# Patient Record
Sex: Female | Born: 1960
Health system: Southern US, Community
[De-identification: ages and names within clinical notes are randomized; demographics above are authoritative.]

## PROBLEM LIST (undated history)

## (undated) DIAGNOSIS — R569 Unspecified convulsions: Secondary | ICD-10-CM

## (undated) DIAGNOSIS — T7840XA Allergy, unspecified, initial encounter: Secondary | ICD-10-CM

## (undated) DIAGNOSIS — F419 Anxiety disorder, unspecified: Secondary | ICD-10-CM

## (undated) DIAGNOSIS — J189 Pneumonia, unspecified organism: Secondary | ICD-10-CM

## (undated) DIAGNOSIS — Z8669 Personal history of other diseases of the nervous system and sense organs: Secondary | ICD-10-CM

## (undated) DIAGNOSIS — M1990 Unspecified osteoarthritis, unspecified site: Secondary | ICD-10-CM

## (undated) DIAGNOSIS — J309 Allergic rhinitis, unspecified: Secondary | ICD-10-CM

## (undated) DIAGNOSIS — J449 Chronic obstructive pulmonary disease, unspecified: Secondary | ICD-10-CM

## (undated) DIAGNOSIS — K828 Other specified diseases of gallbladder: Secondary | ICD-10-CM

## (undated) DIAGNOSIS — Z923 Personal history of irradiation: Secondary | ICD-10-CM

## (undated) DIAGNOSIS — Z87442 Personal history of urinary calculi: Secondary | ICD-10-CM

## (undated) DIAGNOSIS — R0600 Dyspnea, unspecified: Secondary | ICD-10-CM

## (undated) DIAGNOSIS — J439 Emphysema, unspecified: Secondary | ICD-10-CM

## (undated) DIAGNOSIS — C50919 Malignant neoplasm of unspecified site of unspecified female breast: Secondary | ICD-10-CM

## (undated) DIAGNOSIS — F329 Major depressive disorder, single episode, unspecified: Secondary | ICD-10-CM

## (undated) DIAGNOSIS — I10 Essential (primary) hypertension: Secondary | ICD-10-CM

## (undated) DIAGNOSIS — G4730 Sleep apnea, unspecified: Secondary | ICD-10-CM

## (undated) DIAGNOSIS — F32A Depression, unspecified: Secondary | ICD-10-CM

## (undated) DIAGNOSIS — N1831 Chronic kidney disease, stage 3a: Secondary | ICD-10-CM

## (undated) DIAGNOSIS — E785 Hyperlipidemia, unspecified: Secondary | ICD-10-CM

## (undated) HISTORY — PX: COLONOSCOPY: SHX174

## (undated) HISTORY — DX: Chronic obstructive pulmonary disease, unspecified: J44.9

## (undated) HISTORY — DX: Unspecified osteoarthritis, unspecified site: M19.90

## (undated) HISTORY — DX: Depression, unspecified: F32.A

## (undated) HISTORY — PX: ABDOMINAL HYSTERECTOMY: SHX81

## (undated) HISTORY — DX: Personal history of other diseases of the nervous system and sense organs: Z86.69

## (undated) HISTORY — DX: Sleep apnea, unspecified: G47.30

## (undated) HISTORY — DX: Emphysema, unspecified: J43.9

## (undated) HISTORY — DX: Anxiety disorder, unspecified: F41.9

## (undated) HISTORY — DX: Allergic rhinitis, unspecified: J30.9

## (undated) HISTORY — DX: Unspecified convulsions: R56.9

## (undated) HISTORY — DX: Hyperlipidemia, unspecified: E78.5

## (undated) HISTORY — DX: Major depressive disorder, single episode, unspecified: F32.9

## (undated) HISTORY — DX: Allergy, unspecified, initial encounter: T78.40XA

---

## 1993-09-28 HISTORY — PX: OTHER SURGICAL HISTORY: SHX169

## 1993-09-28 HISTORY — PX: PARTIAL HYSTERECTOMY: SHX80

## 2011-03-13 LAB — HM PAP SMEAR

## 2011-04-28 ENCOUNTER — Ambulatory Visit: Payer: Self-pay

## 2011-05-01 LAB — HM MAMMOGRAPHY: HM MAMMO: ABNORMAL — AB (ref 0–4)

## 2011-05-14 ENCOUNTER — Ambulatory Visit: Payer: Self-pay

## 2011-07-21 ENCOUNTER — Ambulatory Visit: Payer: Self-pay | Admitting: General Surgery

## 2011-07-21 LAB — HM COLONOSCOPY

## 2012-09-28 HISTORY — PX: BREAST BIOPSY: SHX20

## 2014-07-12 ENCOUNTER — Ambulatory Visit: Payer: Self-pay | Admitting: Family Medicine

## 2015-03-07 ENCOUNTER — Other Ambulatory Visit: Payer: Self-pay | Admitting: Unknown Physician Specialty

## 2015-09-02 ENCOUNTER — Other Ambulatory Visit: Payer: Self-pay | Admitting: Unknown Physician Specialty

## 2015-10-04 ENCOUNTER — Other Ambulatory Visit: Payer: Self-pay | Admitting: Unknown Physician Specialty

## 2015-11-30 ENCOUNTER — Other Ambulatory Visit: Payer: Self-pay | Admitting: Unknown Physician Specialty

## 2016-01-01 ENCOUNTER — Other Ambulatory Visit: Payer: Self-pay | Admitting: Unknown Physician Specialty

## 2016-04-02 DIAGNOSIS — K080 Exfoliation of teeth due to systemic causes: Secondary | ICD-10-CM | POA: Diagnosis not present

## 2016-09-28 ENCOUNTER — Other Ambulatory Visit: Payer: Self-pay | Admitting: Unknown Physician Specialty

## 2016-10-01 ENCOUNTER — Other Ambulatory Visit: Payer: Self-pay | Admitting: Unknown Physician Specialty

## 2016-11-16 DIAGNOSIS — K080 Exfoliation of teeth due to systemic causes: Secondary | ICD-10-CM | POA: Diagnosis not present

## 2016-11-17 ENCOUNTER — Other Ambulatory Visit: Payer: Self-pay | Admitting: Unknown Physician Specialty

## 2016-11-17 NOTE — Telephone Encounter (Signed)
Routing to provider. Patient has not been seen in a little over 2 years now.

## 2016-11-19 ENCOUNTER — Other Ambulatory Visit: Payer: Self-pay | Admitting: Unknown Physician Specialty

## 2016-12-17 ENCOUNTER — Other Ambulatory Visit: Payer: Self-pay | Admitting: Unknown Physician Specialty

## 2016-12-17 NOTE — Telephone Encounter (Signed)
Patient has not been seen in [place].

## 2016-12-21 DIAGNOSIS — F32A Depression, unspecified: Secondary | ICD-10-CM | POA: Insufficient documentation

## 2016-12-21 DIAGNOSIS — G4730 Sleep apnea, unspecified: Secondary | ICD-10-CM | POA: Insufficient documentation

## 2016-12-21 DIAGNOSIS — F419 Anxiety disorder, unspecified: Secondary | ICD-10-CM | POA: Insufficient documentation

## 2016-12-21 DIAGNOSIS — J432 Centrilobular emphysema: Secondary | ICD-10-CM | POA: Insufficient documentation

## 2016-12-21 DIAGNOSIS — F329 Major depressive disorder, single episode, unspecified: Secondary | ICD-10-CM | POA: Insufficient documentation

## 2016-12-21 DIAGNOSIS — E785 Hyperlipidemia, unspecified: Secondary | ICD-10-CM | POA: Insufficient documentation

## 2016-12-21 DIAGNOSIS — J309 Allergic rhinitis, unspecified: Secondary | ICD-10-CM | POA: Insufficient documentation

## 2016-12-21 DIAGNOSIS — J449 Chronic obstructive pulmonary disease, unspecified: Secondary | ICD-10-CM | POA: Insufficient documentation

## 2016-12-29 ENCOUNTER — Encounter: Payer: Self-pay | Admitting: Unknown Physician Specialty

## 2016-12-29 ENCOUNTER — Ambulatory Visit (INDEPENDENT_AMBULATORY_CARE_PROVIDER_SITE_OTHER): Payer: Federal, State, Local not specified - PPO | Admitting: Unknown Physician Specialty

## 2016-12-29 DIAGNOSIS — F321 Major depressive disorder, single episode, moderate: Secondary | ICD-10-CM | POA: Diagnosis not present

## 2016-12-29 DIAGNOSIS — J42 Unspecified chronic bronchitis: Secondary | ICD-10-CM

## 2016-12-29 MED ORDER — BUDESONIDE-FORMOTEROL FUMARATE 160-4.5 MCG/ACT IN AERO: 2.0000 | INHALATION_SPRAY | Freq: Two times a day (BID) | RESPIRATORY_TRACT | 3 refills | Status: DC

## 2016-12-29 MED ORDER — ALBUTEROL SULFATE HFA 108 (90 BASE) MCG/ACT IN AERS: 2.0000 | INHALATION_SPRAY | Freq: Four times a day (QID) | RESPIRATORY_TRACT | 2 refills | Status: DC | PRN

## 2016-12-29 NOTE — Assessment & Plan Note (Signed)
Stable, continue present medications.   

## 2016-12-29 NOTE — Progress Notes (Signed)
BP (!) 148/83 (BP Location: Left Arm, Cuff Size: Large)   Pulse 83   Temp 98.6 F (37 C)   Ht 5' 0.2" (1.529 m)   Wt 231 lb 1.6 oz (104.8 kg)   LMP  (LMP Unknown)   SpO2 97%   BMI 44.83 kg/m    Subjective:    Patient ID: Dorothy Cunningham, female    DOB: [birth year], 56 y.o.   MRN: [id number]  HPI: Dorothy Cunningham is a 56 y.o. female  Chief Complaint  Patient presents with  . Depression  . Labs Only    pt states she in interested in Hep C and HIV labs    Depression Doing well with mood and depression while taking Wellbutrin.   Depression screen PHQ 2/9 12/29/2016  Decreased Interest 0  Down, Depressed, Hopeless 0  PHQ - 2 Score 0   Asthma Has Symbicort only once a day.  She does not have a rescue inhaler.  Occasional SOB with climbing stairs.  She does admit not exercising due to Asthma  Relevant past medical, surgical, family and social history reviewed and updated as indicated. Interim medical history since our last visit reviewed. Allergies and medications reviewed and updated.  Review of Systems  Per HPI unless specifically indicated above     Objective:    BP (!) 148/83 (BP Location: Left Arm, Cuff Size: Large)   Pulse 83   Temp 98.6 F (37 C)   Ht 5' 0.2" (1.529 m)   Wt 231 lb 1.6 oz (104.8 kg)   LMP  (LMP Unknown)   SpO2 97%   BMI 44.83 kg/m   Wt Readings from Last 3 Encounters:  12/29/16 231 lb 1.6 oz (104.8 kg)  11/09/14 220 lb (99.8 kg)    Physical Exam  Constitutional: She is oriented to person, place, and time. She appears well-developed and well-nourished. No distress.  HENT:  Head: Normocephalic and atraumatic.  Eyes: Conjunctivae and lids are normal. Right eye exhibits no discharge. Left eye exhibits no discharge. No scleral icterus.  Neck: Normal range of motion. Neck supple. No JVD present. Carotid bruit is not present.  Cardiovascular: Normal rate, regular rhythm and normal heart sounds.   Pulmonary/Chest: Effort normal and  breath sounds normal.  Abdominal: Normal appearance. There is no splenomegaly or hepatomegaly.  Musculoskeletal: Normal range of motion.  Neurological: She is alert and oriented to person, place, and time.  Skin: Skin is warm, dry and intact. No rash noted. No pallor.  Psychiatric: She has a normal mood and affect. Her behavior is normal. Judgment and thought content normal.    Results for orders placed or performed in visit on 12/21/16  HM MAMMOGRAPHY  Result Value Ref Range   HM Mammogram Self Reported Abnormal (A) 0-4 Bi-Rad, Self Reported Normal  HM PAP SMEAR  Result Value Ref Range   HM Pap smear per PP   HM COLONOSCOPY  Result Value Ref Range   HM Colonoscopy Patient Reported See Report (in chart), Patient Reported      Assessment & Plan:   Problem List Items Addressed This Visit      Unprioritized   COPD (chronic obstructive pulmonary disease) ([place])    Worseing.  Increase Symbicort.  Rx for Albuterol prn.   Quit smoking 2 years ago      Relevant Medications   Chlorpheniramine Maleate (ALLERGY RELIEF PO)   budesonide-formoterol (SYMBICORT) 160-4.5 MCG/ACT inhaler   albuterol (PROVENTIL HFA;VENTOLIN HFA) 108 (90 Base) MCG/ACT inhaler  Depression    Stable, continue present medications.            Follow up plan: Return for physical.

## 2016-12-29 NOTE — Assessment & Plan Note (Addendum)
Worseing.  Increase Symbicort.  Rx for Albuterol prn.   Quit smoking 2 years ago

## 2017-02-01 ENCOUNTER — Encounter: Payer: Federal, State, Local not specified - PPO | Admitting: Unknown Physician Specialty

## 2017-02-26 ENCOUNTER — Encounter: Payer: Self-pay | Admitting: Unknown Physician Specialty

## 2017-02-26 ENCOUNTER — Ambulatory Visit (INDEPENDENT_AMBULATORY_CARE_PROVIDER_SITE_OTHER): Payer: Federal, State, Local not specified - PPO | Admitting: Unknown Physician Specialty

## 2017-02-26 VITALS — BP 137/81 | HR 99 | Temp 98.8°F | Ht 60.6 in | Wt 235.5 lb

## 2017-02-26 DIAGNOSIS — Z0000 Encounter for general adult medical examination without abnormal findings: Secondary | ICD-10-CM | POA: Diagnosis not present

## 2017-02-26 DIAGNOSIS — G4730 Sleep apnea, unspecified: Secondary | ICD-10-CM | POA: Diagnosis not present

## 2017-02-26 DIAGNOSIS — F321 Major depressive disorder, single episode, moderate: Secondary | ICD-10-CM | POA: Diagnosis not present

## 2017-02-26 DIAGNOSIS — J42 Unspecified chronic bronchitis: Secondary | ICD-10-CM | POA: Diagnosis not present

## 2017-02-26 NOTE — Patient Instructions (Signed)
Please do call to schedule your mammogram; the number to schedule one at either [facility] or [facility] is [phone]   

## 2017-02-26 NOTE — Progress Notes (Signed)
BP 137/81 (BP Location: Left Arm, Cuff Size: Large)   Pulse 99   Temp 98.8 F (37.1 C)   Ht 5' 0.6" (1.539 m)   Wt 235 lb 8 oz (106.8 kg)   LMP  (LMP Unknown)   SpO2 96%   BMI 45.09 kg/m    Subjective:    Patient ID: Dorothy Cunningham, female    DOB: 06-25-1961, 56 y.o.   MRN: [id number]  HPI: Dorothy Cunningham is a 56 y.o. female  Chief Complaint  Patient presents with  . Annual Exam   COPD Winded in the heat but not taking second dose of Symbicort.   Triggers: heat Night time symptoms: none Wheeze/SOB:with activity  Depression Doing well Depression screen [facility] 2/9 02/26/2017 12/29/2016  Decreased Interest 1 0  Down, Depressed, Hopeless 0 0  PHQ - 2 Score 1 0   Sleep apnea Snores at night.  Does not wake up rested.  Rarely has a headache.  Falls asleep easily after sitting.    Family History  Problem Relation Age of Onset  . Thyroid disease Mother   . Asthma Mother   . Osteoarthritis Father   . CAD Father   . Diabetes Maternal Grandfather   . Diabetes Paternal Grandmother    Social History   Social History  . Marital status: Married    Spouse name: N/A  . Number of children: N/A  . Years of education: N/A   Social History Main Topics  . Smoking status: Former Smoker    Quit date: 09/28/2014  . Smokeless tobacco: Never Used  . Alcohol use No  . Drug use: No  . Sexual activity: No   Other Topics Concern  . None   Social History Narrative  . None   Past Medical History:  Diagnosis Date  . Allergic rhinitis   . Anxiety   . COPD (chronic obstructive pulmonary disease) ([place])   . Depression   . History of epilepsy    as a teenager  . Hyperlipidemia   . Sleep apnea    Past Surgical History:  Procedure Laterality Date  . Bladder Tuck  1995  . BREAST BIOPSY Left 2014   benign  . PARTIAL HYSTERECTOMY  1995   due to heavy periods      Relevant past medical, surgical, family and social history reviewed and updated as indicated. Interim  medical history since our last visit reviewed. Allergies and medications reviewed and updated.  Review of Systems  Constitutional: Positive for fatigue.  HENT: Negative.   Eyes: Negative.   Respiratory: Negative for shortness of breath and wheezing.   Cardiovascular: Negative.   Gastrointestinal: Negative.   Endocrine: Negative.   Genitourinary: Negative.   Musculoskeletal: Negative.   Skin: Negative.   Allergic/Immunologic: Negative.   Neurological: Negative.   Hematological: Negative.   Psychiatric/Behavioral: Negative.     Per HPI unless specifically indicated above     Objective:    BP 137/81 (BP Location: Left Arm, Cuff Size: Large)   Pulse 99   Temp 98.8 F (37.1 C)   Ht 5' 0.6" (1.539 m)   Wt 235 lb 8 oz (106.8 kg)   LMP  (LMP Unknown)   SpO2 96%   BMI 45.09 kg/m   Wt Readings from Last 3 Encounters:  02/26/17 235 lb 8 oz (106.8 kg)  12/29/16 231 lb 1.6 oz (104.8 kg)  11/09/14 220 lb (99.8 kg)    Physical Exam  Constitutional: She is oriented to person, place,  and time. She appears well-developed and well-nourished.  HENT:  Head: Normocephalic and atraumatic.  Eyes: Pupils are equal, round, and reactive to light. Right eye exhibits no discharge. Left eye exhibits no discharge. No scleral icterus.  Neck: Normal range of motion. Neck supple. Carotid bruit is not present. No thyromegaly present.  Cardiovascular: Normal rate, regular rhythm and normal heart sounds.  Exam reveals no gallop and no friction rub.   No murmur heard. Pulmonary/Chest: Effort normal and breath sounds normal. No respiratory distress. She has no wheezes. She has no rales.  Abdominal: Soft. Bowel sounds are normal. There is no tenderness. There is no rebound.  Genitourinary: No breast swelling, tenderness or discharge.  Musculoskeletal: Normal range of motion.  Lymphadenopathy:    She has no cervical adenopathy.  Neurological: She is alert and oriented to person, place, and time.  Skin:  Skin is warm, dry and intact. No rash noted.  Psychiatric: She has a normal mood and affect. Her speech is normal and behavior is normal. Judgment and thought content normal. Cognition and memory are normal.    Results for orders placed or performed in visit on 12/21/16  HM MAMMOGRAPHY  Result Value Ref Range   HM Mammogram Self Reported Abnormal (A) 0-4 Bi-Rad, Self Reported Normal  HM PAP SMEAR  Result Value Ref Range   HM Pap smear per PP   HM COLONOSCOPY  Result Value Ref Range   HM Colonoscopy Patient Reported See Report (in chart), Patient Reported  --++++++++++++--+    Assessment & Plan:   Problem List Items Addressed This Visit      Unprioritized   COPD (chronic obstructive pulmonary disease) ([facility])    Increase Symbicort.  RTC if continued SOB      Depression    Stable, continue present medications.        Sleep apnea   Relevant Orders   Ambulatory referral to Sleep Studies    Other Visit Diagnoses    Annual physical exam    -  Primary   Relevant Orders   CBC with Differential/Platelet   Comprehensive metabolic panel   Lipid Panel w/o Chol/HDL Ratio   TSH   MM DIGITAL SCREENING BILATERAL   HIV antibody   Hepatitis C antibody       Follow up plan: Return in about 6 months (around 08/28/2017).

## 2017-02-26 NOTE — Assessment & Plan Note (Signed)
Stable, continue present medications.   

## 2017-02-26 NOTE — Assessment & Plan Note (Signed)
Increase Symbicort.  RTC if continued SOB

## 2017-02-27 LAB — CBC WITH DIFFERENTIAL/PLATELET
Basophils Absolute: 0 10*3/uL (ref 0.0–0.2)
Basos: 0 %
EOS (ABSOLUTE): 0.1 10*3/uL (ref 0.0–0.4)
Eos: 1 %
Hematocrit: 44.9 % (ref 34.0–46.6)
Hemoglobin: 15.2 g/dL (ref 11.1–15.9)
Immature Grans (Abs): 0 10*3/uL (ref 0.0–0.1)
Immature Granulocytes: 0 %
Lymphocytes Absolute: 4.2 10*3/uL — ABNORMAL HIGH (ref 0.7–3.1)
Lymphs: 40 %
MCH: 32.7 pg (ref 26.6–33.0)
MCHC: 33.9 g/dL (ref 31.5–35.7)
MCV: 97 fL (ref 79–97)
Monocytes Absolute: 0.4 10*3/uL (ref 0.1–0.9)
Monocytes: 4 %
Neutrophils Absolute: 5.6 10*3/uL (ref 1.4–7.0)
Neutrophils: 55 %
Platelets: 271 10*3/uL (ref 150–379)
RBC: 4.65 x10E6/uL (ref 3.77–5.28)
RDW: 13.9 % (ref 12.3–15.4)
WBC: 10.4 10*3/uL (ref 3.4–10.8)

## 2017-02-27 LAB — HEPATITIS C ANTIBODY: Hep C Virus Ab: <0.1 s/co ratio (ref 0.0–0.9)

## 2017-02-27 LAB — COMPREHENSIVE METABOLIC PANEL
ALK PHOS: 62 IU/L (ref 39–117)
ALT: 34 IU/L — AB (ref 0–32)
AST: 22 IU/L (ref 0–40)
Albumin/Globulin Ratio: 1.5 (ref 1.2–2.2)
Albumin: 4.3 g/dL (ref 3.5–5.5)
BUN/Creatinine Ratio: 12 (ref 9–23)
BUN: 17 mg/dL (ref 6–24)
Bilirubin Total: 0.3 mg/dL (ref 0.0–1.2)
CALCIUM: 9.4 mg/dL (ref 8.7–10.2)
CO2: 24 mmol/L (ref 18–29)
CREATININE: 1.4 mg/dL — AB (ref 0.57–1.00)
Chloride: 101 mmol/L (ref 96–106)
GFR calc Af Amer: 48 mL/min/{1.73_m2} — ABNORMAL LOW (ref >59)
GFR, EST NON AFRICAN AMERICAN: 41 mL/min/{1.73_m2} — AB (ref >59)
GLUCOSE: 88 mg/dL (ref 65–99)
Globulin, Total: 2.8 g/dL (ref 1.5–4.5)
Potassium: 4.2 mmol/L (ref 3.5–5.2)
Sodium: 141 mmol/L (ref 134–144)
Total Protein: 7.1 g/dL (ref 6.0–8.5)

## 2017-02-27 LAB — LIPID PANEL W/O CHOL/HDL RATIO
Cholesterol, Total: 236 mg/dL — ABNORMAL HIGH (ref 100–199)
HDL: 36 mg/dL — ABNORMAL LOW (ref >39)
LDL Calculated: 126 mg/dL — ABNORMAL HIGH (ref 0–99)
Triglycerides: 371 mg/dL — ABNORMAL HIGH (ref 0–149)
VLDL Cholesterol Cal: 74 mg/dL — ABNORMAL HIGH (ref 5–40)

## 2017-02-27 LAB — TSH: TSH: 2.69 u[IU]/mL (ref 0.450–4.500)

## 2017-02-27 LAB — HIV ANTIBODY (ROUTINE TESTING W REFLEX): HIV Screen 4th Generation wRfx: NONREACTIVE

## 2017-03-01 ENCOUNTER — Encounter: Payer: Self-pay | Admitting: Unknown Physician Specialty

## 2017-03-26 DIAGNOSIS — G4733 Obstructive sleep apnea (adult) (pediatric): Secondary | ICD-10-CM | POA: Diagnosis not present

## 2017-05-17 DIAGNOSIS — K080 Exfoliation of teeth due to systemic causes: Secondary | ICD-10-CM | POA: Diagnosis not present

## 2017-09-03 ENCOUNTER — Ambulatory Visit: Payer: Federal, State, Local not specified - PPO | Admitting: Unknown Physician Specialty

## 2017-10-23 ENCOUNTER — Other Ambulatory Visit: Payer: Self-pay | Admitting: Unknown Physician Specialty

## 2017-10-25 NOTE — Telephone Encounter (Signed)
Flonase refill request Last OV 02/26/17.  Visit on 09/03/17 was cancelled.  No future appts scheduled. Pharmacy [facility] [place], [place]  [place] [address] Julian Hy

## 2017-10-28 ENCOUNTER — Ambulatory Visit: Payer: Federal, State, Local not specified - PPO | Admitting: Family Medicine

## 2017-10-28 ENCOUNTER — Encounter: Payer: Self-pay | Admitting: Family Medicine

## 2017-10-28 VITALS — BP 182/88 | HR 79 | Temp 98.2°F | Wt 240.7 lb

## 2017-10-28 DIAGNOSIS — J441 Chronic obstructive pulmonary disease with (acute) exacerbation: Secondary | ICD-10-CM | POA: Diagnosis not present

## 2017-10-28 MED ORDER — GUAIFENESIN ER 600 MG PO TB12: 600.0000 mg | ORAL_TABLET | Freq: Two times a day (BID) | ORAL | 0 refills | Status: DC

## 2017-10-28 MED ORDER — PREDNISONE 10 MG PO TABS: ORAL_TABLET | ORAL | 0 refills | Status: DC

## 2017-10-28 MED ORDER — BENZONATATE 200 MG PO CAPS: 200.0000 mg | ORAL_CAPSULE | Freq: Three times a day (TID) | ORAL | 0 refills | Status: DC | PRN

## 2017-10-28 MED ORDER — GUAIFENESIN-CODEINE 100-10 MG/5ML PO SOLN: 10.0000 mL | Freq: Three times a day (TID) | ORAL | 0 refills | Status: DC | PRN

## 2017-10-28 NOTE — Patient Instructions (Signed)
Follow up as needed

## 2017-10-28 NOTE — Progress Notes (Signed)
BP (!) 182/88 (BP Location: Left Arm, Patient Position: Sitting, Cuff Size: Normal)   Pulse 79   Temp 98.2 F (36.8 C) (Oral)   Wt 240 lb 11.2 oz (109.2 kg)   LMP  (LMP Unknown)   SpO2 96%   BMI 46.08 kg/m    Subjective:    Patient ID: Dorothy Cunningham, female    DOB: [birth year], 57 y.o.   MRN: [id number]  HPI: Dorothy Cunningham is a 57 y.o. female  Chief Complaint  Patient presents with  . Cough    Ongoing for 3 days. Unable to sleep. Started off as a dry cough. Has became worse and slightly productive patient states.   3+ days of worsening hacking cough, now becoming productive with a "gurgling" sound in chest. Worst at night with laying down. Nyquil was helping but makes her super sleepy. Some nasal congestion but otherwise not much as far as other symptoms. Still compliant with symbicort and albuterol for her COPD, feels like her breathing is a bit labored with wheezing. No sick contacts that she's aware of. Denies fevers, chills, sweats, CP.   Relevant past medical, surgical, family and social history reviewed and updated as indicated. Interim medical history since our last visit reviewed. Allergies and medications reviewed and updated.  Review of Systems  Constitutional: Negative.   HENT: Positive for congestion.   Respiratory: Positive for cough, chest tightness, shortness of breath and wheezing.   Cardiovascular: Negative.   Gastrointestinal: Negative.   Genitourinary: Negative.   Musculoskeletal: Negative.   Skin: Negative.   Neurological: Negative.   Psychiatric/Behavioral: Negative.     Per HPI unless specifically indicated above     Objective:    BP (!) 182/88 (BP Location: Left Arm, Patient Position: Sitting, Cuff Size: Normal)   Pulse 79   Temp 98.2 F (36.8 C) (Oral)   Wt 240 lb 11.2 oz (109.2 kg)   LMP  (LMP Unknown)   SpO2 96%   BMI 46.08 kg/m   Wt Readings from Last 3 Encounters:  10/28/17 240 lb 11.2 oz (109.2 kg)  02/26/17 235 lb 8  oz (106.8 kg)  12/29/16 231 lb 1.6 oz (104.8 kg)    Physical Exam  Constitutional: She is oriented to person, place, and time. She appears well-developed and well-nourished. No distress.  HENT:  Head: Atraumatic.  Right Ear: External ear normal.  Left Ear: External ear normal.  Nose: Nose normal.  Mouth/Throat: No oropharyngeal exudate.  Oropharynx injected  Eyes: Conjunctivae are normal. Pupils are equal, round, and reactive to light.  Neck: Normal range of motion. Neck supple.  Cardiovascular: Normal rate and normal heart sounds.  Pulmonary/Chest: Effort normal. No respiratory distress. She has wheezes.  Musculoskeletal: Normal range of motion.  Neurological: She is alert and oriented to person, place, and time.  Skin: Skin is warm and dry.  Psychiatric: She has a normal mood and affect. Her behavior is normal.  Nursing note and vitals reviewed.   Results for orders placed or performed in visit on 02/26/17  CBC with Differential/Platelet  Result Value Ref Range   WBC 10.4 3.4 - 10.8 x10E3/uL   RBC 4.65 3.77 - 5.28 x10E6/uL   Hemoglobin 15.2 11.1 - 15.9 g/dL   Hematocrit 44.9 34.0 - 46.6 %   MCV 97 79 - 97 fL   MCH 32.7 26.6 - 33.0 pg   MCHC 33.9 31.5 - 35.7 g/dL   RDW 13.9 12.3 - 15.4 %   Platelets 271 150 -  379 x10E3/uL   Neutrophils 55 Not Estab. %   Lymphs 40 Not Estab. %   Monocytes 4 Not Estab. %   Eos 1 Not Estab. %   Basos 0 Not Estab. %   Neutrophils Absolute 5.6 1.4 - 7.0 x10E3/uL   Lymphocytes Absolute 4.2 (H) 0.7 - 3.1 x10E3/uL   Monocytes Absolute 0.4 0.1 - 0.9 x10E3/uL   EOS (ABSOLUTE) 0.1 0.0 - 0.4 x10E3/uL   Basophils Absolute 0.0 0.0 - 0.2 x10E3/uL   Immature Granulocytes 0 Not Estab. %   Immature Grans (Abs) 0.0 0.0 - 0.1 x10E3/uL  Comprehensive metabolic panel  Result Value Ref Range   Glucose 88 65 - 99 mg/dL   BUN 17 6 - 24 mg/dL   Creatinine, Ser 1.40 (H) 0.57 - 1.00 mg/dL   GFR calc non Af Amer 41 (L) >59 mL/min/1.73   GFR calc Af Amer 48  (L) >59 mL/min/1.73   BUN/Creatinine Ratio 12 9 - 23   Sodium 141 134 - 144 mmol/L   Potassium 4.2 3.5 - 5.2 mmol/L   Chloride 101 96 - 106 mmol/L   CO2 24 18 - 29 mmol/L   Calcium 9.4 8.7 - 10.2 mg/dL   Total Protein 7.1 6.0 - 8.5 g/dL   Albumin 4.3 3.5 - 5.5 g/dL   Globulin, Total 2.8 1.5 - 4.5 g/dL   Albumin/Globulin Ratio 1.5 1.2 - 2.2   Bilirubin Total 0.3 0.0 - 1.2 mg/dL   Alkaline Phosphatase 62 39 - 117 IU/L   AST 22 0 - 40 IU/L   ALT 34 (H) 0 - 32 IU/L  Lipid Panel w/o Chol/HDL Ratio  Result Value Ref Range   Cholesterol, Total 236 (H) 100 - 199 mg/dL   Triglycerides 371 (H) 0 - 149 mg/dL   HDL 36 (L) >39 mg/dL   VLDL Cholesterol Cal 74 (H) 5 - 40 mg/dL   LDL Calculated 126 (H) 0 - 99 mg/dL  TSH  Result Value Ref Range   TSH 2.690 0.450 - 4.500 uIU/mL  HIV antibody  Result Value Ref Range   HIV Screen 4th Generation wRfx Non Reactive Non Reactive  Hepatitis C antibody  Result Value Ref Range   Hep C Virus Ab <0.1 0.0 - 0.9 s/co ratio      Assessment & Plan:   Problem List Items Addressed This Visit    None    Visit Diagnoses    COPD exacerbation ([place])    -  Primary   Will tx with prednisone taper, plain mucinex, tessalon perles, and robitussin AC. Precautions reviewed. Continue regular inhaler and allergy regimen   Relevant Medications   guaiFENesin (MUCINEX) 600 MG 12 hr tablet   predniSONE (DELTASONE) 10 MG tablet   guaiFENesin-codeine 100-10 MG/5ML syrup   benzonatate (TESSALON) 200 MG capsule       Follow up plan: Return if symptoms worsen or fail to improve.

## 2017-11-01 ENCOUNTER — Encounter: Payer: Self-pay | Admitting: Family Medicine

## 2017-11-01 ENCOUNTER — Telehealth: Payer: Self-pay | Admitting: Family Medicine

## 2017-11-01 ENCOUNTER — Ambulatory Visit
Admission: RE | Admit: 2017-11-01 | Discharge: 2017-11-01 | Disposition: A | Discharge status: Home / Self Care | Payer: Federal, State, Local not specified - PPO | Source: Ambulatory Visit | Attending: Family Medicine | Admitting: Family Medicine

## 2017-11-01 ENCOUNTER — Ambulatory Visit: Payer: Federal, State, Local not specified - PPO | Admitting: Family Medicine

## 2017-11-01 VITALS — BP 169/99 | HR 93 | Temp 99.3°F | Wt 240.3 lb

## 2017-11-01 DIAGNOSIS — R05 Cough: Secondary | ICD-10-CM | POA: Diagnosis not present

## 2017-11-01 DIAGNOSIS — J181 Lobar pneumonia, unspecified organism: Principal | ICD-10-CM

## 2017-11-01 DIAGNOSIS — J849 Interstitial pulmonary disease, unspecified: Secondary | ICD-10-CM | POA: Insufficient documentation

## 2017-11-01 DIAGNOSIS — J189 Pneumonia, unspecified organism: Secondary | ICD-10-CM

## 2017-11-01 DIAGNOSIS — R0602 Shortness of breath: Secondary | ICD-10-CM | POA: Diagnosis not present

## 2017-11-01 MED ORDER — AZITHROMYCIN 250 MG PO TABS: ORAL_TABLET | ORAL | 0 refills | Status: DC

## 2017-11-01 MED ORDER — GUAIFENESIN-CODEINE 100-10 MG/5ML PO SOLN: 10.0000 mL | Freq: Three times a day (TID) | ORAL | 0 refills | Status: DC | PRN

## 2017-11-01 NOTE — Telephone Encounter (Signed)
Called pt and discussed x-ray results showing pneumonia. Will continue as planned with zpack, prednisome, albuterol, tessalon, and robitussin AC

## 2017-11-01 NOTE — Progress Notes (Signed)
BP (!) 169/99   Pulse 93   Temp 99.3 F (37.4 C) (Oral)   Wt 240 lb 4.8 oz (109 kg)   LMP  (LMP Unknown)   SpO2 93%   BMI 46.01 kg/m    Subjective:    Patient ID: Dorothy Cunningham, female    DOB: October 02, 1960, 57 y.o.   MRN: [id number]  HPI: Dorothy Cunningham is a 57 y.o. female  Chief Complaint  Patient presents with  . URI    pt states she is still having congestion, sinus pressure, and a dry cough   Pt here today following up on URI sxs. States things are worsening despite prednisone which she's only got 2 days left on and mucinex. Cough medicines helping some. Persistent congestion, sinus pressure, fatigue, weakness, SOB, and hacking cough. Hearing rattling in her lungs, worst at night. Denies known fevers, CP, N/V/D. Hx of COPD, no longer smoking. Taking her symbicort and albuterol faithfully.   Relevant past medical, surgical, family and social history reviewed and updated as indicated. Interim medical history since our last visit reviewed. Allergies and medications reviewed and updated.  Review of Systems  Constitutional: Positive for fatigue.  HENT: Positive for congestion and sinus pressure.   Respiratory: Positive for cough and shortness of breath.   Cardiovascular: Negative.   Gastrointestinal: Negative.   Genitourinary: Negative.   Musculoskeletal: Negative.   Neurological: Positive for weakness.  Psychiatric/Behavioral: Negative.     Per HPI unless specifically indicated above     Objective:    BP (!) 169/99   Pulse 93   Temp 99.3 F (37.4 C) (Oral)   Wt 240 lb 4.8 oz (109 kg)   LMP  (LMP Unknown)   SpO2 93%   BMI 46.01 kg/m   Wt Readings from Last 3 Encounters:  11/01/17 240 lb 4.8 oz (109 kg)  10/28/17 240 lb 11.2 oz (109.2 kg)  02/26/17 235 lb 8 oz (106.8 kg)    Physical Exam  Constitutional: She is oriented to person, place, and time. She appears well-developed and well-nourished. No distress.  HENT:  Head: Atraumatic.  Right Ear:  External ear normal.  Left Ear: External ear normal.  Oropharynx erythematous and mildly edematous Sinuses ttp  Eyes: Conjunctivae are normal. Pupils are equal, round, and reactive to light.  Neck: Normal range of motion. Neck supple.  Cardiovascular: Normal rate and normal heart sounds.  Pulmonary/Chest: Effort normal. No respiratory distress. She has wheezes (mild wheezes). She has rales (b/l rales ). She exhibits no tenderness.  Musculoskeletal: Normal range of motion.  Neurological: She is alert and oriented to person, place, and time.  Skin: Skin is warm and dry.  Psychiatric: She has a normal mood and affect. Her behavior is normal.  Nursing note and vitals reviewed.  Results for orders placed or performed in visit on 02/26/17  CBC with Differential/Platelet  Result Value Ref Range   WBC 10.4 3.4 - 10.8 x10E3/uL   RBC 4.65 3.77 - 5.28 x10E6/uL   Hemoglobin 15.2 11.1 - 15.9 g/dL   Hematocrit 44.9 34.0 - 46.6 %   MCV 97 79 - 97 fL   MCH 32.7 26.6 - 33.0 pg   MCHC 33.9 31.5 - 35.7 g/dL   RDW 13.9 12.3 - 15.4 %   Platelets 271 150 - 379 x10E3/uL   Neutrophils 55 Not Estab. %   Lymphs 40 Not Estab. %   Monocytes 4 Not Estab. %   Eos 1 Not Estab. %  Basos 0 Not Estab. %   Neutrophils Absolute 5.6 1.4 - 7.0 x10E3/uL   Lymphocytes Absolute 4.2 (H) 0.7 - 3.1 x10E3/uL   Monocytes Absolute 0.4 0.1 - 0.9 x10E3/uL   EOS (ABSOLUTE) 0.1 0.0 - 0.4 x10E3/uL   Basophils Absolute 0.0 0.0 - 0.2 x10E3/uL   Immature Granulocytes 0 Not Estab. %   Immature Grans (Abs) 0.0 0.0 - 0.1 x10E3/uL  Comprehensive metabolic panel  Result Value Ref Range   Glucose 88 65 - 99 mg/dL   BUN 17 6 - 24 mg/dL   Creatinine, Ser 1.40 (H) 0.57 - 1.00 mg/dL   GFR calc non Af Amer 41 (L) >59 mL/min/1.73   GFR calc Af Amer 48 (L) >59 mL/min/1.73   BUN/Creatinine Ratio 12 9 - 23   Sodium 141 134 - 144 mmol/L   Potassium 4.2 3.5 - 5.2 mmol/L   Chloride 101 96 - 106 mmol/L   CO2 24 18 - 29 mmol/L   Calcium  9.4 8.7 - 10.2 mg/dL   Total Protein 7.1 6.0 - 8.5 g/dL   Albumin 4.3 3.5 - 5.5 g/dL   Globulin, Total 2.8 1.5 - 4.5 g/dL   Albumin/Globulin Ratio 1.5 1.2 - 2.2   Bilirubin Total 0.3 0.0 - 1.2 mg/dL   Alkaline Phosphatase 62 39 - 117 IU/L   AST 22 0 - 40 IU/L   ALT 34 (H) 0 - 32 IU/L  Lipid Panel w/o Chol/HDL Ratio  Result Value Ref Range   Cholesterol, Total 236 (H) 100 - 199 mg/dL   Triglycerides 371 (H) 0 - 149 mg/dL   HDL 36 (L) >39 mg/dL   VLDL Cholesterol Cal 74 (H) 5 - 40 mg/dL   LDL Calculated 126 (H) 0 - 99 mg/dL  TSH  Result Value Ref Range   TSH 2.690 0.450 - 4.500 uIU/mL  HIV antibody  Result Value Ref Range   HIV Screen 4th Generation wRfx Non Reactive Non Reactive  Hepatitis C antibody  Result Value Ref Range   Hep C Virus Ab <0.1 0.0 - 0.9 s/co ratio      Assessment & Plan:   Problem List Items Addressed This Visit    None    Visit Diagnoses    Pneumonia of both lower lobes due to infectious organism [facility])    -  Primary   Await CXR, start zpack. Continue prednisone, tessalon, mucinex, inhalers, and robitussin AC. Driving and sedation precautoins reviewed. F/u if no better   Relevant Medications   azithromycin (ZITHROMAX) 250 MG tablet   guaiFENesin-codeine 100-10 MG/5ML syrup   Other Relevant Orders   DG Chest 2 View (Completed)       Follow up plan: Return if symptoms worsen or fail to improve.

## 2017-11-03 NOTE — Patient Instructions (Signed)
Follow up as needed

## 2017-11-05 ENCOUNTER — Ambulatory Visit: Payer: Self-pay

## 2017-11-05 MED ORDER — DOXYCYCLINE HYCLATE 100 MG PO TABS: 100.0000 mg | ORAL_TABLET | Freq: Two times a day (BID) | ORAL | 0 refills | Status: DC

## 2017-11-05 NOTE — Telephone Encounter (Signed)
  Reason for Disposition . Caller has NON-URGENT medication question about med that PCP prescribed and triager unable to answer question  Answer Assessment - Initial Assessment Questions 1. SYMPTOMS: "Do you have any symptoms?"     Seen 10/01/17 dx with pna. Pt states she feels better. Pt with productive cough but is much less frequent, C/o wheezing that is alleviated with her inhaler, fatigue. No fever. 2. SEVERITY: If symptoms are present, ask "Are they mild, moderate or severe?"     Pt stated that her symptoms have improved and is feeling better but is asking if she needs abx.  Protocols used: MEDICATION QUESTION CALL-A-AH

## 2017-11-05 NOTE — Telephone Encounter (Signed)
Pt came in with her husband during his OV, spoke with her and have sent in doxycycline as she is improved but not fully resolved with her pneumonia. She will keep [place] posted with how she's feeling

## 2017-11-05 NOTE — Telephone Encounter (Signed)
Pt called to inquire if she needed more abx. Pt states she feels overall better. Denies fever, SOB at rest and "just a little" with exertion. She states that her cough is less frequent but and is more productive. She c/o occasional wheezing but stated her inhalers help.  She c/o fatigue. Pt states her husband was dx with the Flu and has an appt this am. Pt is eating and drinking well.

## 2017-11-05 NOTE — Telephone Encounter (Signed)
Routing to provider who saw patient

## 2017-11-29 ENCOUNTER — Other Ambulatory Visit: Payer: Self-pay | Admitting: Unknown Physician Specialty

## 2017-12-16 DIAGNOSIS — K080 Exfoliation of teeth due to systemic causes: Secondary | ICD-10-CM | POA: Diagnosis not present

## 2018-02-26 ENCOUNTER — Other Ambulatory Visit: Payer: Self-pay | Admitting: Unknown Physician Specialty

## 2018-03-01 NOTE — Telephone Encounter (Signed)
Patient needs an appointment

## 2018-04-04 ENCOUNTER — Other Ambulatory Visit: Payer: Self-pay | Admitting: Unknown Physician Specialty

## 2018-05-06 ENCOUNTER — Other Ambulatory Visit: Payer: Self-pay | Admitting: Unknown Physician Specialty

## 2018-05-06 NOTE — Telephone Encounter (Signed)
Needs appointment for follow-up

## 2018-05-11 ENCOUNTER — Encounter: Payer: Self-pay | Admitting: Family Medicine

## 2018-05-11 NOTE — Telephone Encounter (Signed)
LVM for pt to call back also printing letter to be mailed.

## 2018-05-20 ENCOUNTER — Ambulatory Visit: Payer: Federal, State, Local not specified - PPO | Admitting: Family Medicine

## 2018-05-20 DIAGNOSIS — Z1231 Encounter for screening mammogram for malignant neoplasm of breast: Secondary | ICD-10-CM | POA: Diagnosis not present

## 2018-05-20 DIAGNOSIS — R5383 Other fatigue: Secondary | ICD-10-CM

## 2018-05-20 DIAGNOSIS — M25561 Pain in right knee: Secondary | ICD-10-CM

## 2018-05-20 DIAGNOSIS — Z1239 Encounter for other screening for malignant neoplasm of breast: Secondary | ICD-10-CM

## 2018-05-20 DIAGNOSIS — G8929 Other chronic pain: Secondary | ICD-10-CM

## 2018-05-20 DIAGNOSIS — F321 Major depressive disorder, single episode, moderate: Secondary | ICD-10-CM

## 2018-05-20 DIAGNOSIS — J309 Allergic rhinitis, unspecified: Secondary | ICD-10-CM

## 2018-05-20 DIAGNOSIS — M25562 Pain in left knee: Secondary | ICD-10-CM

## 2018-05-20 DIAGNOSIS — Z23 Encounter for immunization: Secondary | ICD-10-CM

## 2018-05-20 DIAGNOSIS — J42 Unspecified chronic bronchitis: Secondary | ICD-10-CM

## 2018-05-20 MED ORDER — BUDESONIDE-FORMOTEROL FUMARATE 160-4.5 MCG/ACT IN AERO: 2.0000 | INHALATION_SPRAY | Freq: Two times a day (BID) | RESPIRATORY_TRACT | 11 refills | Status: DC

## 2018-05-20 MED ORDER — DICLOFENAC SODIUM 1 % TD GEL: 2.0000 g | Freq: Four times a day (QID) | TRANSDERMAL | 11 refills | Status: DC

## 2018-05-20 MED ORDER — BUPROPION HCL ER (XL) 300 MG PO TB24: 300.0000 mg | ORAL_TABLET | Freq: Every day | ORAL | 2 refills | Status: DC

## 2018-05-20 NOTE — Patient Instructions (Addendum)
Please call and schedule your mammogram at your earliest convenience as listed below. [facility] [address], [place] [address] [phone]    Glucosamine chondroitin

## 2018-05-20 NOTE — Progress Notes (Signed)
LMP  (LMP Unknown)    Subjective:    Patient ID: Dorothy Cunningham, female    DOB: 05-23-1961, 57 y.o.   MRN: [id number]  HPI: Dorothy Cunningham is a 57 y.o. female  Chief Complaint  Patient presents with  . Medication Refill    symbicort   Here today for follow up.   Breathing has been stable on symbicort and albuterol prn, no recent flares. Former smoker, quit in 2016 and does much better now that she's not actively smoking. Denies CP, wheezing, DOE.   Having fatigue, unsure how long it's been going on. Does have a hx of depression that is under decent control with low dose wellbutrin. Taking a multivitamin. Denies fevers, chills, sweats.   B/l knee pain, dull aching worse after sitting still for long periods of time. Stiffness, cracking when bent. Tylenol and ibuprofen seem to help. No swelling, redness, injury.   Depression screen [facility] 2/9 05/20/2018 02/26/2017 12/29/2016  Decreased Interest 1 1 0  Down, Depressed, Hopeless 1 0 0  PHQ - 2 Score 2 1 0  Altered sleeping 1 - -  Tired, decreased energy 3 - -  Change in appetite 2 - -  Feeling bad or failure about yourself  2 - -  Trouble concentrating 1 - -  Moving slowly or fidgety/restless 0 - -  Suicidal thoughts 0 - -  PHQ-9 Score 11 - -   Past Medical History:  Diagnosis Date  . Allergic rhinitis   . Anxiety   . COPD (chronic obstructive pulmonary disease) ([place])   . Depression   . History of epilepsy    as a teenager  . Hyperlipidemia   . Sleep apnea    Social History   Socioeconomic History  . Marital status: Married    Spouse name: Not on file  . Number of children: Not on file  . Years of education: Not on file  . Highest education level: Not on file  Occupational History  . Not on file  Social Needs  . Financial resource strain: Not on file  . Food insecurity:    Worry: Not on file    Inability: Not on file  . Transportation needs:    Medical: Not on file    Non-medical: Not on file  Tobacco  Use  . Smoking status: Former Smoker    Last attempt to quit: 09/28/2014    Years since quitting: 3.6  . Smokeless tobacco: Never Used  Substance and Sexual Activity  . Alcohol use: No  . Drug use: No  . Sexual activity: Never  Lifestyle  . Physical activity:    Days per week: Not on file    Minutes per session: Not on file  . Stress: Not on file  Relationships  . Social connections:    Talks on phone: Not on file    Gets together: Not on file    Attends religious service: Not on file    Active member of club or organization: Not on file    Attends meetings of clubs or organizations: Not on file    Relationship status: Not on file  . Intimate partner violence:    Fear of current or ex partner: Not on file    Emotionally abused: Not on file    Physically abused: Not on file    Forced sexual activity: Not on file  Other Topics Concern  . Not on file  Social History Narrative  . Not on file  Relevant past medical, surgical, family and social history reviewed and updated as indicated. Interim medical history since our last visit reviewed. Allergies and medications reviewed and updated.  Review of Systems  Per HPI unless specifically indicated above     Objective:    LMP  (LMP Unknown)   Wt Readings from Last 3 Encounters:  11/01/17 240 lb 4.8 oz (109 kg)  10/28/17 240 lb 11.2 oz (109.2 kg)  02/26/17 235 lb 8 oz (106.8 kg)    Physical Exam  Constitutional: She is oriented to person, place, and time. She appears well-developed and well-nourished. No distress.  HENT:  Head: Atraumatic.  Right Ear: External ear normal.  Left Ear: External ear normal.  Nose: Nose normal.  Mouth/Throat: Oropharynx is clear and moist. No oropharyngeal exudate.  Eyes: Pupils are equal, round, and reactive to light. Conjunctivae are normal. No scleral icterus.  Neck: Normal range of motion. Neck supple. No thyromegaly present.  Cardiovascular: Normal rate, regular rhythm, normal heart  sounds and intact distal pulses.  Pulmonary/Chest: Effort normal and breath sounds normal. No respiratory distress. She has no wheezes.  Abdominal: Soft. Bowel sounds are normal. She exhibits no mass. There is no tenderness.  Musculoskeletal: Normal range of motion. She exhibits no edema, tenderness or deformity.  Crepitus of b/l knees with PROM  Lymphadenopathy:    She has no cervical adenopathy.  Neurological: She is alert and oriented to person, place, and time. No cranial nerve deficit.  Skin: Skin is warm and dry. No rash noted.  Psychiatric: She has a normal mood and affect. Her behavior is normal.  Nursing note and vitals reviewed.   Results for orders placed or performed in visit on 05/20/18  Vitamin D (25 hydroxy)  Result Value Ref Range   Vit D, 25-Hydroxy 22.4 (L) 30.0 - 100.0 ng/mL  Vitamin B12  Result Value Ref Range   Vitamin B-12 316 232 - 1,245 pg/mL      Assessment & Plan:   Problem List Items Addressed This Visit      Respiratory   Allergic rhinitis    Stable and under good control. Continue current regimen      COPD (chronic obstructive pulmonary disease) (HCC)    Stable on inhaler regimen. Continue current regimen      Relevant Medications   budesonide-formoterol (SYMBICORT) 160-4.5 MCG/ACT inhaler     Other   Depression - Primary    Increase wellbutrin to 300 mg and monitor for benefit.       Relevant Medications   buPROPion (WELLBUTRIN XL) 300 MG 24 hr tablet    Other Visit Diagnoses    Breast cancer screening       Relevant Orders   MM DIGITAL SCREENING BILATERAL   Flu vaccine need       Relevant Orders   Flu Vaccine QUAD 36+ mos IM (Completed)   Fatigue, unspecified type       Check vit levels. Recent normal TSH and CBC. Will also increase wellbutrin and monitor for relief   Relevant Orders   Vitamin D (25 hydroxy) (Completed)   Vitamin B12 (Completed)   Chronic pain of both knees       Appears arthritic. Recommended glucosamine,  continued OTC pain relievers, diclofenac gel, epsom salts. Work on weight loss   Relevant Medications   buPROPion (WELLBUTRIN XL) 300 MG 24 hr tablet       Follow up plan: Return in about 3 months (around 08/20/2018) for CPE, mood f/u.

## 2018-05-21 LAB — VITAMIN B12: VITAMIN B 12: 316 pg/mL (ref 232–1245)

## 2018-05-21 LAB — VITAMIN D 25 HYDROXY (VIT D DEFICIENCY, FRACTURES): Vit D, 25-Hydroxy: 22.4 ng/mL — ABNORMAL LOW (ref 30.0–100.0)

## 2018-05-23 ENCOUNTER — Other Ambulatory Visit: Payer: Self-pay | Admitting: Family Medicine

## 2018-05-23 DIAGNOSIS — E559 Vitamin D deficiency, unspecified: Secondary | ICD-10-CM | POA: Insufficient documentation

## 2018-05-23 NOTE — Assessment & Plan Note (Signed)
Stable and under good control. Continue current regimen 

## 2018-05-23 NOTE — Assessment & Plan Note (Signed)
Increase wellbutrin to 300 mg and monitor for benefit.

## 2018-05-23 NOTE — Assessment & Plan Note (Signed)
Stable on inhaler regimen. Continue current regimen

## 2018-06-02 ENCOUNTER — Encounter: Payer: Self-pay | Admitting: Physician Assistant

## 2018-06-02 ENCOUNTER — Ambulatory Visit: Payer: Federal, State, Local not specified - PPO | Admitting: Physician Assistant

## 2018-06-02 VITALS — BP 149/83 | HR 88 | Temp 98.8°F | Wt 236.0 lb

## 2018-06-02 DIAGNOSIS — A084 Viral intestinal infection, unspecified: Secondary | ICD-10-CM | POA: Diagnosis not present

## 2018-06-02 NOTE — Progress Notes (Signed)
   Subjective:    Patient ID: Dorothy Cunningham, female    DOB: August 16, 1961, 57 y.o.   MRN: [id number]  Dorothy Cunningham is a 57 y.o. female presenting on 06/02/2018 for Diarrhea (pt states she was vomitting and had diarrhea on Tuesday, states she is better today, just weak )   HPI   Reports history of nausea, vomiting and diarrhea. This started on Tuesday. Last vomited on Tuesday. Has had some diarrhea since then, non bloody. Has been able to tolerate PO intake but still with some diarrhea. She denies fevers, chills, recent antibiotics, recent hospitalization. Says she feels better just wanted to make sure everything is OK.   Social History   Tobacco Use  . Smoking status: Former Smoker    Last attempt to quit: 09/28/2014    Years since quitting: 3.6  . Smokeless tobacco: Never Used  Substance Use Topics  . Alcohol use: No  . Drug use: No    Review of Systems Per HPI unless specifically indicated above     Objective:    BP (!) 149/83   Pulse 88   Temp 98.8 F (37.1 C) (Oral)   Wt 236 lb (107 kg)   LMP  (LMP Unknown)   SpO2 97%   BMI 45.18 kg/m   Wt Readings from Last 3 Encounters:  06/02/18 236 lb (107 kg)  11/01/17 240 lb 4.8 oz (109 kg)  10/28/17 240 lb 11.2 oz (109.2 kg)    Physical Exam  Constitutional: She appears well-developed and well-nourished.  Cardiovascular: Normal rate and regular rhythm.  Pulmonary/Chest: Effort normal and breath sounds normal.  Abdominal: Soft. Bowel sounds are normal. There is no tenderness.  Skin: Skin is warm and dry.  Psychiatric: She has a normal mood and affect. Her behavior is normal.   Results for orders placed or performed in visit on 05/20/18  Vitamin D (25 hydroxy)  Result Value Ref Range   Vit D, 25-Hydroxy 22.4 (L) 30.0 - 100.0 ng/mL  Vitamin B12  Result Value Ref Range   Vitamin B-12 316 232 - 1,245 pg/mL      Assessment & Plan:  1. Viral gastroenteritis  Better today, does not desire nausea medication.  Exam is benign. Bland foods as tolerated, push fluids.    Follow up plan: Return if symptoms worsen or fail to improve.  Carles Collet, PA-C [facility] 06/02/2018, 12:46 PM

## 2018-06-02 NOTE — Patient Instructions (Signed)

## 2018-06-13 DIAGNOSIS — K080 Exfoliation of teeth due to systemic causes: Secondary | ICD-10-CM | POA: Diagnosis not present

## 2018-06-27 ENCOUNTER — Other Ambulatory Visit: Payer: Self-pay | Admitting: Family Medicine

## 2018-06-27 ENCOUNTER — Encounter: Payer: Self-pay | Admitting: Family Medicine

## 2018-06-27 MED ORDER — NYSTATIN 100000 UNIT/GM EX OINT: TOPICAL_OINTMENT | CUTANEOUS | 3 refills | Status: DC

## 2018-07-11 ENCOUNTER — Encounter: Payer: Self-pay | Admitting: Family Medicine

## 2018-07-29 ENCOUNTER — Other Ambulatory Visit: Payer: Self-pay | Admitting: Family Medicine

## 2018-07-29 NOTE — Telephone Encounter (Signed)
Requested Prescriptions  Pending Prescriptions Disp Refills  . buPROPion (WELLBUTRIN XL) 300 MG 24 hr tablet [Pharmacy Med Name: BUPROPION XL 300MG  TABLETS] 90 tablet 0    Sig: TAKE 1 TABLET(300 MG) BY MOUTH DAILY     Psychiatry: Antidepressants - bupropion Failed - 07/29/2018  4:59 AM      Failed - Last BP in normal range    BP Readings from Last 1 Encounters:  06/02/18 (!) 149/83         Passed - Completed PHQ-2 or PHQ-9 in the last 360 days.      Passed - Valid encounter within last 6 months    Recent Outpatient Visits          1 month ago Viral gastroenteritis   [facility] Carles Collet M, PA-C   2 months ago Current moderate episode of major depressive disorder without prior episode [facility])   [place], [place], [place]   9 months ago Pneumonia of both lower lobes due to infectious organism [facility])   [facility] Merrie Roof [place], [place]   9 months ago COPD exacerbation [facility])   [facility] Volney American, [place]   1 year ago Annual physical exam   [facility] Kathrine Haddock, NP      Future Appointments            In 3 weeks Orene Desanctis, Lilia Argue, [place], [facility]

## 2018-08-23 ENCOUNTER — Encounter: Payer: Federal, State, Local not specified - PPO | Admitting: Family Medicine

## 2018-08-24 ENCOUNTER — Encounter: Payer: Self-pay | Admitting: Family Medicine

## 2018-08-24 ENCOUNTER — Ambulatory Visit (INDEPENDENT_AMBULATORY_CARE_PROVIDER_SITE_OTHER): Payer: Federal, State, Local not specified - PPO | Admitting: Family Medicine

## 2018-08-24 VITALS — BP 140/79 | HR 80 | Temp 98.7°F | Ht 60.6 in | Wt 237.9 lb

## 2018-08-24 DIAGNOSIS — F321 Major depressive disorder, single episode, moderate: Secondary | ICD-10-CM | POA: Diagnosis not present

## 2018-08-24 DIAGNOSIS — E782 Mixed hyperlipidemia: Secondary | ICD-10-CM | POA: Diagnosis not present

## 2018-08-24 DIAGNOSIS — Z0000 Encounter for general adult medical examination without abnormal findings: Secondary | ICD-10-CM | POA: Diagnosis not present

## 2018-08-24 DIAGNOSIS — J42 Unspecified chronic bronchitis: Secondary | ICD-10-CM | POA: Diagnosis not present

## 2018-08-24 MED ORDER — BUPROPION HCL ER (XL) 300 MG PO TB24: ORAL_TABLET | ORAL | 3 refills | Status: DC

## 2018-08-24 NOTE — Progress Notes (Signed)
BP 140/79 (BP Location: Right Arm, Cuff Size: Large)   Pulse 80   Temp 98.7 F (37.1 C) (Oral)   Ht 5' 0.6" (1.539 m)   Wt 237 lb 14.4 oz (107.9 kg)   LMP  (LMP Unknown)   SpO2 96%   BMI 45.55 kg/m    Subjective:    Patient ID: Dorothy Cunningham, female    DOB: 01/24/1961, 57 y.o.   MRN: [id number]  HPI: Dorothy Cunningham is a 57 y.o. female presenting on 08/24/2018 for comprehensive medical examination. Current medical complaints include:none  Moods, anxiety doing well on wellbutrin. No concerns, SI/HI, sleep or appetite issues.   Breathing stable without recent flares, taking symbicort daily and albuterol prn.   Diclofenac gel helping significantly with her joint pains.   She currently lives with: Menopausal Symptoms: no  Depression Screen done today and results listed below:  Depression screen [facility] 2/9 08/24/2018 05/20/2018 02/26/2017 12/29/2016  Decreased Interest 0 1 1 0  Down, Depressed, Hopeless 0 1 0 0  PHQ - 2 Score 0 2 1 0  Altered sleeping 0 1 - -  Tired, decreased energy 0 3 - -  Change in appetite 0 2 - -  Feeling bad or failure about yourself  0 2 - -  Trouble concentrating 0 1 - -  Moving slowly or fidgety/restless 0 0 - -  Suicidal thoughts 0 0 - -  PHQ-9 Score 0 11 - -  Difficult doing work/chores Not difficult at all - - -    The patient does not have a history of falls. I did not complete a risk assessment for falls. A plan of care for falls was not documented.   Past Medical History:  Past Medical History:  Diagnosis Date  . Allergic rhinitis   . Anxiety   . COPD (chronic obstructive pulmonary disease) ([place])   . Depression   . History of epilepsy    as a teenager  . Hyperlipidemia   . Sleep apnea     Surgical History:  Past Surgical History:  Procedure Laterality Date  . Bladder Tuck  1995  . BREAST BIOPSY Left 2014   benign  . PARTIAL HYSTERECTOMY  1995   due to heavy periods    Medications:  Current Outpatient Medications  on File Prior to Visit  Medication Sig  . albuterol (PROVENTIL HFA;VENTOLIN HFA) 108 (90 Base) MCG/ACT inhaler Inhale 2 puffs into the lungs every 6 (six) hours as needed for wheezing or shortness of breath.  . budesonide-formoterol (SYMBICORT) 160-4.5 MCG/ACT inhaler Inhale 2 puffs into the lungs 2 (two) times daily.  . Chlorpheniramine Maleate (ALLERGY RELIEF PO) Take 1 tablet by mouth as needed.  . diclofenac sodium (VOLTAREN) 1 % GEL Apply 2 g topically 4 (four) times daily.  . fluticasone (FLONASE) 50 MCG/ACT nasal spray SHAKE LIQUID AND USE 2 SPRAYS IN EACH NOSTRIL DAILY  . nystatin ointment (MYCOSTATIN) APPLY TO THE AFFECTED AREA THREE TIMES DAILY.   No current facility-administered medications on file prior to visit.     Allergies:  Allergies  Allergen Reactions  . Penicillins Hives    Social History:  Social History   Socioeconomic History  . Marital status: Married    Spouse name: Not on file  . Number of children: Not on file  . Years of education: Not on file  . Highest education level: Not on file  Occupational History  . Not on file  Social Needs  . Financial resource strain:  Not on file  . Food insecurity:    Worry: Not on file    Inability: Not on file  . Transportation needs:    Medical: Not on file    Non-medical: Not on file  Tobacco Use  . Smoking status: Former Smoker    Last attempt to quit: 09/28/2014    Years since quitting: 3.9  . Smokeless tobacco: Never Used  Substance and Sexual Activity  . Alcohol use: No  . Drug use: No  . Sexual activity: Never  Lifestyle  . Physical activity:    Days per week: Not on file    Minutes per session: Not on file  . Stress: Not on file  Relationships  . Social connections:    Talks on phone: Not on file    Gets together: Not on file    Attends religious service: Not on file    Active member of club or organization: Not on file    Attends meetings of clubs or organizations: Not on file    Relationship  status: Not on file  . Intimate partner violence:    Fear of current or ex partner: Not on file    Emotionally abused: Not on file    Physically abused: Not on file    Forced sexual activity: Not on file  Other Topics Concern  . Not on file  Social History Narrative  . Not on file   Social History   Tobacco Use  Smoking Status Former Smoker  . Last attempt to quit: 09/28/2014  . Years since quitting: 3.9  Smokeless Tobacco Never Used   Social History   Substance and Sexual Activity  Alcohol Use No    Family History:  Family History  Problem Relation Age of Onset  . Thyroid disease Mother   . Asthma Mother   . Osteoarthritis Father   . CAD Father   . Diabetes Maternal Grandfather   . Tuberculosis Maternal Grandfather   . Diabetes Paternal Grandmother   . Diabetes Maternal Uncle     Past medical history, surgical history, medications, allergies, family history and social history reviewed with patient today and changes made to appropriate areas of the chart.   Review of Systems - General ROS: negative Psychological ROS: negative Ophthalmic ROS: negative ENT ROS: negative Allergy and Immunology ROS: negative Hematological and Lymphatic ROS: negative Endocrine ROS: negative Breast ROS: negative for breast lumps Respiratory ROS: no cough, shortness of breath, or wheezing Cardiovascular ROS: no chest pain or dyspnea on exertion Gastrointestinal ROS: no abdominal pain, change in bowel habits, or black or bloody stools Genito-Urinary ROS: no dysuria, trouble voiding, or hematuria Musculoskeletal ROS: negative Neurological ROS: no TIA or stroke symptoms Dermatological ROS: negative All other ROS negative except what is listed above and in the HPI.      Objective:    BP 140/79 (BP Location: Right Arm, Cuff Size: Large)   Pulse 80   Temp 98.7 F (37.1 C) (Oral)   Ht 5' 0.6" (1.539 m)   Wt 237 lb 14.4 oz (107.9 kg)   LMP  (LMP Unknown)   SpO2 96%   BMI 45.55 kg/m    Wt Readings from Last 3 Encounters:  08/24/18 237 lb 14.4 oz (107.9 kg)  06/02/18 236 lb (107 kg)  11/01/17 240 lb 4.8 oz (109 kg)    Physical Exam  Constitutional: She is oriented to person, place, and time. She appears well-developed and well-nourished. No distress.  HENT:  Head: Atraumatic.  Right  Ear: External ear normal.  Left Ear: External ear normal.  Nose: Nose normal.  Mouth/Throat: Oropharynx is clear and moist. No oropharyngeal exudate.  Eyes: Pupils are equal, round, and reactive to light. Conjunctivae are normal. No scleral icterus.  Neck: Normal range of motion. Neck supple. No thyromegaly present.  Cardiovascular: Normal rate, regular rhythm, normal heart sounds and intact distal pulses.  Pulmonary/Chest: Effort normal and breath sounds normal. No respiratory distress. Right breast exhibits no mass, no nipple discharge, no skin change and no tenderness. Left breast exhibits no mass, no nipple discharge, no skin change and no tenderness.  Abdominal: Soft. Bowel sounds are normal. She exhibits no mass. There is no tenderness.  Genitourinary:  Genitourinary Comments: Exam declined  Musculoskeletal: Normal range of motion. She exhibits no edema or tenderness.  Lymphadenopathy:    She has no cervical adenopathy.    She has no axillary adenopathy.  Neurological: She is alert and oriented to person, place, and time. No cranial nerve deficit.  Skin: Skin is warm and dry. No rash noted.  Psychiatric: She has a normal mood and affect. Her behavior is normal.  Nursing note and vitals reviewed.   Results for orders placed or performed in visit on 08/24/18  Microscopic Examination  Result Value Ref Range   WBC, UA 11-30 (A) 0 - 5 /hpf   RBC, UA 0-2 0 - 2 /hpf   Epithelial Cells (non renal) 0-10 0 - 10 /hpf   Bacteria, UA Moderate (A) None seen/Few  Urine Culture, Reflex  Result Value Ref Range   Urine Culture, Routine WILL FOLLOW   UA/M w/rflx Culture, Routine  Result  Value Ref Range   Specific Gravity, UA >1.030 (H) 1.005 - 1.030   pH, UA 5.5 5.0 - 7.5   Color, UA Yellow Yellow   Appearance Ur Hazy (A) Clear   Leukocytes, UA 1+ (A) Negative   Protein, UA Trace (A) Negative/Trace   Glucose, UA Negative Negative   Ketones, UA Trace (A) Negative   RBC, UA Trace (A) Negative   Bilirubin, UA Negative Negative   Urobilinogen, Ur 0.2 0.2 - 1.0 mg/dL   Nitrite, UA Negative Negative   Microscopic Examination See below:    Urinalysis Reflex Comment       Assessment & Plan:   Problem List Items Addressed This Visit      Respiratory   COPD (chronic obstructive pulmonary disease) (HCC)    Stable, continue current regimen        Other   Depression - Primary    Stable on wellbutrin, continue current regimen      Relevant Medications   buPROPion (WELLBUTRIN XL) 300 MG 24 hr tablet   Hyperlipidemia    Recheck lipid panel, continue diet and exercise modifications       Other Visit Diagnoses    Annual physical exam       Relevant Orders   CBC with Differential/Platelet   Comprehensive metabolic panel   Lipid Panel w/o Chol/HDL Ratio   TSH   UA/M w/rflx Culture, Routine (Completed)       Follow up plan: Return in about 1 year (around 08/25/2019) for CPE.   LABORATORY TESTING:  - Pap smear: not applicable  IMMUNIZATIONS:   - Tdap: Tetanus vaccination status reviewed: last tetanus booster within 10 years. - Influenza: Up to date  SCREENING: -Mammogram: Ordered today  - Colonoscopy: Up to date   PATIENT COUNSELING:   Advised to take 1 mg of folate supplement per  day if capable of pregnancy.   Sexuality: Discussed sexually transmitted diseases, partner selection, use of condoms, avoidance of unintended pregnancy  and contraceptive alternatives.   Advised to avoid cigarette smoking.  I discussed with the patient that most people either abstain from alcohol or drink within safe limits (<=14/week and <=4 drinks/occasion for males,  <=7/weeks and <= 3 drinks/occasion for females) and that the risk for alcohol disorders and other health effects rises proportionally with the number of drinks per week and how often a drinker exceeds daily limits.  Discussed cessation/primary prevention of drug use and availability of treatment for abuse.   Diet: Encouraged to adjust caloric intake to maintain  or achieve ideal body weight, to reduce intake of dietary saturated fat and total fat, to limit sodium intake by avoiding high sodium foods and not adding table salt, and to maintain adequate dietary potassium and calcium preferably from fresh fruits, vegetables, and low-fat dairy products.    stressed the importance of regular exercise  Injury prevention: Discussed safety belts, safety helmets, smoke detector, smoking near bedding or upholstery.   Dental health: Discussed importance of regular tooth brushing, flossing, and dental visits.    NEXT PREVENTATIVE PHYSICAL DUE IN 1 YEAR. Return in about 1 year (around 08/25/2019) for CPE.

## 2018-08-24 NOTE — Assessment & Plan Note (Signed)
Stable on wellbutrin, continue current regimen

## 2018-08-24 NOTE — Assessment & Plan Note (Signed)
Recheck lipid panel, continue diet and exercise modifications

## 2018-08-24 NOTE — Assessment & Plan Note (Signed)
Stable, continue current regimen 

## 2018-08-25 LAB — CBC WITH DIFFERENTIAL/PLATELET
Basophils Absolute: 0.1 10*3/uL (ref 0.0–0.2)
Basos: 1 %
EOS (ABSOLUTE): 0.1 10*3/uL (ref 0.0–0.4)
Eos: 2 %
HEMOGLOBIN: 14.1 g/dL (ref 11.1–15.9)
Hematocrit: 40.3 % (ref 34.0–46.6)
Immature Grans (Abs): 0 10*3/uL (ref 0.0–0.1)
Immature Granulocytes: 0 %
LYMPHS ABS: 3.2 10*3/uL — AB (ref 0.7–3.1)
Lymphs: 42 %
MCH: 32.2 pg (ref 26.6–33.0)
MCHC: 35 g/dL (ref 31.5–35.7)
MCV: 92 fL (ref 79–97)
MONOCYTES: 5 %
MONOS ABS: 0.4 10*3/uL (ref 0.1–0.9)
NEUTROS ABS: 3.9 10*3/uL (ref 1.4–7.0)
Neutrophils: 50 %
Platelets: 273 10*3/uL (ref 150–450)
RBC: 4.38 x10E6/uL (ref 3.77–5.28)
RDW: 12.8 % (ref 12.3–15.4)
WBC: 7.7 10*3/uL (ref 3.4–10.8)

## 2018-08-25 LAB — LIPID PANEL W/O CHOL/HDL RATIO
CHOLESTEROL TOTAL: 225 mg/dL — AB (ref 100–199)
HDL: 32 mg/dL — ABNORMAL LOW (ref >39)
LDL Calculated: 153 mg/dL — ABNORMAL HIGH (ref 0–99)
Triglycerides: 198 mg/dL — ABNORMAL HIGH (ref 0–149)
VLDL Cholesterol Cal: 40 mg/dL (ref 5–40)

## 2018-08-25 LAB — COMPREHENSIVE METABOLIC PANEL
ALBUMIN: 4.3 g/dL (ref 3.5–5.5)
ALK PHOS: 55 IU/L (ref 39–117)
ALT: 25 IU/L (ref 0–32)
AST: 17 IU/L (ref 0–40)
Albumin/Globulin Ratio: 1.7 (ref 1.2–2.2)
BUN / CREAT RATIO: 12 (ref 9–23)
BUN: 13 mg/dL (ref 6–24)
Bilirubin Total: 0.4 mg/dL (ref 0.0–1.2)
CHLORIDE: 102 mmol/L (ref 96–106)
CO2: 22 mmol/L (ref 20–29)
CREATININE: 1.11 mg/dL — AB (ref 0.57–1.00)
Calcium: 9.6 mg/dL (ref 8.7–10.2)
GFR calc Af Amer: 63 mL/min/{1.73_m2} (ref >59)
GFR calc non Af Amer: 54 mL/min/{1.73_m2} — ABNORMAL LOW (ref >59)
GLOBULIN, TOTAL: 2.6 g/dL (ref 1.5–4.5)
Glucose: 86 mg/dL (ref 65–99)
Potassium: 4.1 mmol/L (ref 3.5–5.2)
SODIUM: 144 mmol/L (ref 134–144)
Total Protein: 6.9 g/dL (ref 6.0–8.5)

## 2018-08-25 LAB — TSH: TSH: 2.03 u[IU]/mL (ref 0.450–4.500)

## 2018-08-29 ENCOUNTER — Other Ambulatory Visit: Payer: Self-pay | Admitting: Family Medicine

## 2018-08-29 LAB — UA/M W/RFLX CULTURE, ROUTINE
Bilirubin, UA: NEGATIVE
GLUCOSE, UA: NEGATIVE
Nitrite, UA: NEGATIVE
PH UA: 5.5 (ref 5.0–7.5)
Specific Gravity, UA: >1.03 — ABNORMAL HIGH (ref 1.005–1.030)
Urobilinogen, Ur: 0.2 mg/dL (ref 0.2–1.0)

## 2018-08-29 LAB — MICROSCOPIC EXAMINATION

## 2018-08-29 LAB — URINE CULTURE, REFLEX

## 2018-08-29 MED ORDER — ATORVASTATIN CALCIUM 20 MG PO TABS: 20.0000 mg | ORAL_TABLET | Freq: Every day | ORAL | 1 refills | Status: DC

## 2018-08-30 ENCOUNTER — Encounter: Payer: Self-pay | Admitting: Family Medicine

## 2018-09-16 ENCOUNTER — Encounter: Payer: Self-pay | Admitting: Nurse Practitioner

## 2018-09-16 ENCOUNTER — Ambulatory Visit: Payer: Federal, State, Local not specified - PPO | Admitting: Nurse Practitioner

## 2018-09-16 DIAGNOSIS — J0100 Acute maxillary sinusitis, unspecified: Secondary | ICD-10-CM | POA: Diagnosis not present

## 2018-09-16 MED ORDER — DOXYCYCLINE HYCLATE 100 MG PO TABS: 100.0000 mg | ORAL_TABLET | Freq: Two times a day (BID) | ORAL | 0 refills | Status: DC

## 2018-09-16 MED ORDER — BENZONATATE 200 MG PO CAPS: 200.0000 mg | ORAL_CAPSULE | Freq: Two times a day (BID) | ORAL | 0 refills | Status: DC | PRN

## 2018-09-16 NOTE — Patient Instructions (Signed)

## 2018-09-16 NOTE — Assessment & Plan Note (Signed)
Due to patient h/o PNA after similar episode of sinusitis will initiate Doxycycline 100 MG BID x 10 days.  Continue Flonase and daily Claritin.  Tessalon for cough script sent.  Recommended increase hydration, sinus rinses, and humidifier at home.  Return (or go to ER/Urgent) for worsening symptoms or continued symptoms.  No CXR today, lung exam clear and minimal cough present; will obtain if worsening symptoms present.

## 2018-09-16 NOTE — Progress Notes (Signed)
BP (!) 154/78 (BP Location: Left Arm, Patient Position: Sitting, Cuff Size: Large)   Pulse 81   Temp 97.6 F (36.4 C) (Oral)   Ht 5\' 1"  (1.549 m)   Wt 241 lb 9.6 oz (109.6 kg)   LMP  (LMP Unknown)   SpO2 95%   BMI 45.65 kg/m    Subjective:    Patient ID: Dorothy Cunningham, female    DOB: 05/22/1961, 57 y.o.   MRN: [id number]  HPI: Dorothy Cunningham is a 57 y.o. female  Chief Complaint  Patient presents with  . Sinusitis    Ongoing 2-3 days. Gradually worsened. Patient states she got pneumonia last time patient got a cold.  . Cough  . Nasal Congestion  . Facial Pain  . Dental Pain  . Headache   UPPER RESPIRATORY TRACT INFECTION Symptoms first started Wednesday night.  They have gradually progressed and become worse.  Has h/o PNA which started out with similar symptoms, she came in early this time to prevent PNA.  She states last time she "waited too long". Worst symptom: sinus pain Fever: no Cough: yes Shortness of breath: yes Wheezing: no Chest pain: no Chest tightness: yes Chest congestion: yes Nasal congestion: yes Runny nose: yes Post nasal drip: yes Sneezing: yes Sore throat: no Swollen glands: no Sinus pressure: yes Headache: yes Face pain: yes Toothache: yes Ear pain: none Ear pressure: none Eyes red/itching:no Eye drainage/crusting: no  Vomiting: no Rash: no Fatigue: yes Sick contacts: yes Strep contacts: no  Context: worse Recurrent sinusitis: no Relief with OTC cold/cough medications: no  Treatments attempted: cold/sinus and anti-histamine   Relevant past medical, surgical, family and social history reviewed and updated as indicated. Interim medical history since our last visit reviewed. Allergies and medications reviewed and updated.  Review of Systems  Constitutional: Negative for activity change, appetite change, diaphoresis, fatigue and fever.  HENT: Positive for congestion, postnasal drip, rhinorrhea, sinus pressure, sinus pain  and sneezing. Negative for ear discharge, ear pain, sore throat and voice change.   Respiratory: Negative for cough, chest tightness and shortness of breath.   Cardiovascular: Negative for chest pain, palpitations and leg swelling.  Gastrointestinal: Negative for abdominal distention, abdominal pain, constipation, diarrhea, nausea and vomiting.  Endocrine: Negative.   Neurological: Negative for dizziness, syncope, weakness, light-headedness, numbness and headaches.  Psychiatric/Behavioral: Negative.      Per HPI unless specifically indicated above     Objective:    BP (!) 154/78 (BP Location: Left Arm, Patient Position: Sitting, Cuff Size: Large)   Pulse 81   Temp 97.6 F (36.4 C) (Oral)   Ht 5\' 1"  (1.549 m)   Wt 241 lb 9.6 oz (109.6 kg)   LMP  (LMP Unknown)   SpO2 95%   BMI 45.65 kg/m   Wt Readings from Last 3 Encounters:  09/16/18 241 lb 9.6 oz (109.6 kg)  08/24/18 237 lb 14.4 oz (107.9 kg)  06/02/18 236 lb (107 kg)    Physical Exam Vitals signs and nursing note reviewed.  Constitutional:      General: She is awake.     Appearance: She is well-developed. She is obese. She is ill-appearing.  HENT:     Head: Normocephalic. No raccoon eyes.     Right Ear: Hearing, ear canal and external ear normal. A middle ear effusion is present.     Left Ear: Hearing, ear canal and external ear normal. A middle ear effusion is present.     Nose: Mucosal edema  and rhinorrhea (greenish drainage) present.     Right Sinus: Maxillary sinus tenderness present. No frontal sinus tenderness.     Left Sinus: Maxillary sinus tenderness present. No frontal sinus tenderness.     Mouth/Throat:     Mouth: Mucous membranes are moist.     Pharynx: Oropharynx is clear. No oropharyngeal exudate or posterior oropharyngeal erythema.  Eyes:     General: Lids are normal.        Right eye: No discharge.        Left eye: No discharge.     Extraocular Movements: Extraocular movements intact.      Conjunctiva/sclera: Conjunctivae normal.     Pupils: Pupils are equal, round, and reactive to light.  Neck:     Musculoskeletal: Normal range of motion and neck supple.     Thyroid: No thyromegaly.     Vascular: No carotid bruit or JVD.  Cardiovascular:     Rate and Rhythm: Normal rate and regular rhythm.     Heart sounds: Normal heart sounds.  Pulmonary:     Effort: Pulmonary effort is normal.     Breath sounds: Normal breath sounds.     Comments: Clear throughout. Abdominal:     General: Bowel sounds are normal.     Palpations: Abdomen is soft.  Lymphadenopathy:     Head:     Right side of head: Submandibular adenopathy present. No submental or tonsillar adenopathy.     Left side of head: Submandibular adenopathy present. No submental or tonsillar adenopathy.     Cervical: No cervical adenopathy.  Skin:    General: Skin is warm and dry.  Neurological:     Mental Status: She is alert and oriented to person, place, and time.     Deep Tendon Reflexes: Reflexes are normal and symmetric.  Psychiatric:        Mood and Affect: Mood normal.        Behavior: Behavior normal. Behavior is cooperative.        Thought Content: Thought content normal.        Judgment: Judgment normal.     Results for orders placed or performed in visit on 08/24/18  Microscopic Examination  Result Value Ref Range   WBC, UA 11-30 (A) 0 - 5 /hpf   RBC, UA 0-2 0 - 2 /hpf   Epithelial Cells (non renal) 0-10 0 - 10 /hpf   Bacteria, UA Moderate (A) None seen/Few  Urine Culture, Reflex  Result Value Ref Range   Urine Culture, Routine Final report    Organism ID, Bacteria Comment   CBC with Differential/Platelet  Result Value Ref Range   WBC 7.7 3.4 - 10.8 x10E3/uL   RBC 4.38 3.77 - 5.28 x10E6/uL   Hemoglobin 14.1 11.1 - 15.9 g/dL   Hematocrit 40.3 34.0 - 46.6 %   MCV 92 79 - 97 fL   MCH 32.2 26.6 - 33.0 pg   MCHC 35.0 31.5 - 35.7 g/dL   RDW 12.8 12.3 - 15.4 %   Platelets 273 150 - 450 x10E3/uL    Neutrophils 50 Not Estab. %   Lymphs 42 Not Estab. %   Monocytes 5 Not Estab. %   Eos 2 Not Estab. %   Basos 1 Not Estab. %   Neutrophils Absolute 3.9 1.4 - 7.0 x10E3/uL   Lymphocytes Absolute 3.2 (H) 0.7 - 3.1 x10E3/uL   Monocytes Absolute 0.4 0.1 - 0.9 x10E3/uL   EOS (ABSOLUTE) 0.1 0.0 - 0.4 x10E3/uL  Basophils Absolute 0.1 0.0 - 0.2 x10E3/uL   Immature Granulocytes 0 Not Estab. %   Immature Grans (Abs) 0.0 0.0 - 0.1 x10E3/uL  Comprehensive metabolic panel  Result Value Ref Range   Glucose 86 65 - 99 mg/dL   BUN 13 6 - 24 mg/dL   Creatinine, Ser 1.11 (H) 0.57 - 1.00 mg/dL   GFR calc non Af Amer 54 (L) >59 mL/min/1.73   GFR calc Af Amer 63 >59 mL/min/1.73   BUN/Creatinine Ratio 12 9 - 23   Sodium 144 134 - 144 mmol/L   Potassium 4.1 3.5 - 5.2 mmol/L   Chloride 102 96 - 106 mmol/L   CO2 22 20 - 29 mmol/L   Calcium 9.6 8.7 - 10.2 mg/dL   Total Protein 6.9 6.0 - 8.5 g/dL   Albumin 4.3 3.5 - 5.5 g/dL   Globulin, Total 2.6 1.5 - 4.5 g/dL   Albumin/Globulin Ratio 1.7 1.2 - 2.2   Bilirubin Total 0.4 0.0 - 1.2 mg/dL   Alkaline Phosphatase 55 39 - 117 IU/L   AST 17 0 - 40 IU/L   ALT 25 0 - 32 IU/L  Lipid Panel w/o Chol/HDL Ratio  Result Value Ref Range   Cholesterol, Total 225 (H) 100 - 199 mg/dL   Triglycerides 198 (H) 0 - 149 mg/dL   HDL 32 (L) >39 mg/dL   VLDL Cholesterol Cal 40 5 - 40 mg/dL   LDL Calculated 153 (H) 0 - 99 mg/dL  TSH  Result Value Ref Range   TSH 2.030 0.450 - 4.500 uIU/mL  UA/M w/rflx Culture, Routine  Result Value Ref Range   Specific Gravity, UA >1.030 (H) 1.005 - 1.030   pH, UA 5.5 5.0 - 7.5   Color, UA Yellow Yellow   Appearance Ur Hazy (A) Clear   Leukocytes, UA 1+ (A) Negative   Protein, UA Trace (A) Negative/Trace   Glucose, UA Negative Negative   Ketones, UA Trace (A) Negative   RBC, UA Trace (A) Negative   Bilirubin, UA Negative Negative   Urobilinogen, Ur 0.2 0.2 - 1.0 mg/dL   Nitrite, UA Negative Negative   Microscopic Examination See  below:    Urinalysis Reflex Comment       Assessment & Plan:   Problem List Items Addressed This Visit      Respiratory   Sinusitis, acute maxillary    Due to patient h/o PNA after similar episode of sinusitis will initiate Doxycycline 100 MG BID x 10 days.  Continue Flonase and daily Claritin.  Tessalon for cough script sent.  Recommended increase hydration, sinus rinses, and humidifier at home.  Return (or go to ER/Urgent) for worsening symptoms or continued symptoms.  No CXR today, lung exam clear and minimal cough present; will obtain if worsening symptoms present.      Relevant Medications   doxycycline (VIBRA-TABS) 100 MG tablet   benzonatate (TESSALON) 200 MG capsule       Follow up plan: Return in about 6 months (around 03/18/2019) for COPD.

## 2018-11-14 DIAGNOSIS — K080 Exfoliation of teeth due to systemic causes: Secondary | ICD-10-CM | POA: Diagnosis not present

## 2018-11-29 ENCOUNTER — Encounter: Payer: Self-pay | Admitting: Nurse Practitioner

## 2018-12-01 ENCOUNTER — Encounter: Payer: Self-pay | Admitting: Nurse Practitioner

## 2018-12-01 DIAGNOSIS — N183 Chronic kidney disease, stage 3 unspecified: Secondary | ICD-10-CM | POA: Insufficient documentation

## 2018-12-01 DIAGNOSIS — N1831 Chronic kidney disease, stage 3a: Secondary | ICD-10-CM | POA: Insufficient documentation

## 2018-12-02 ENCOUNTER — Ambulatory Visit: Payer: Federal, State, Local not specified - PPO | Admitting: Nurse Practitioner

## 2018-12-02 ENCOUNTER — Encounter: Payer: Self-pay | Admitting: Nurse Practitioner

## 2018-12-02 VITALS — BP 147/92 | HR 77 | Temp 98.0°F | Ht 60.0 in | Wt 244.8 lb

## 2018-12-02 DIAGNOSIS — R030 Elevated blood-pressure reading, without diagnosis of hypertension: Secondary | ICD-10-CM | POA: Diagnosis not present

## 2018-12-02 DIAGNOSIS — J42 Unspecified chronic bronchitis: Secondary | ICD-10-CM

## 2018-12-02 DIAGNOSIS — Z6841 Body Mass Index (BMI) 40.0 and over, adult: Secondary | ICD-10-CM | POA: Diagnosis not present

## 2018-12-02 DIAGNOSIS — I10 Essential (primary) hypertension: Secondary | ICD-10-CM | POA: Insufficient documentation

## 2018-12-02 DIAGNOSIS — E669 Obesity, unspecified: Secondary | ICD-10-CM | POA: Insufficient documentation

## 2018-12-02 NOTE — Assessment & Plan Note (Signed)
Educated at [employer] on healthy diet choices.  Recommend continued focus on health diet choices and regular physical activity (30 minutes 5 days a week).  Declines referral to nutrition or weight loss clinic at this time.

## 2018-12-02 NOTE — Patient Instructions (Addendum)
COPD and Physical Activity Chronic obstructive pulmonary disease (COPD) is a long-term (chronic) condition that affects the lungs. COPD is a general term that can be used to describe many different lung problems that cause lung swelling (inflammation) and limit airflow, including chronic bronchitis and emphysema. The main symptom of COPD is shortness of breath, which makes it harder to do even simple tasks. This can also make it harder to exercise and be active. Talk with your health care provider about treatments to help you breathe better and actions you can take to prevent breathing problems during physical activity. What are the benefits of exercising with COPD? Exercising regularly is an important part of a healthy lifestyle. You can still exercise and do physical activities even though you have COPD. Exercise and physical activity improve your shortness of breath by increasing blood flow (circulation). This causes your heart to pump more oxygen through your body. Moderate exercise can improve your:  Oxygen use.  Energy level.  Shortness of breath.  Strength in your breathing muscles.  Heart health.  Sleep.  Self-esteem and feelings of self-worth.  Depression, stress, and anxiety levels. Exercise can benefit everyone with COPD. The severity of your disease may affect how hard you can exercise, especially at first, but everyone can benefit. Talk with your health care provider about how much exercise is safe for you, and which activities and exercises are safe for you. What actions can I take to prevent breathing problems during physical activity?  Sign up for a pulmonary rehabilitation program. This type of program may include: ? Education about lung diseases. ? Exercise classes that teach you how to exercise and be more active while improving your breathing. This usually involves:  Exercise using your lower extremities, such as a stationary bicycle.  About 30 minutes of exercise, 2  to 5 times per week, for 6 to 12 weeks  Strength training, such as push ups or leg lifts. ? Nutrition education. ? Group classes in which you can talk with others who also have COPD and learn ways to manage stress.  If you use an oxygen tank, you should use it while you exercise. Work with your health care provider to adjust your oxygen for your physical activity. Your resting flow rate is different from your flow rate during physical activity.  While you are exercising: ? Take slow breaths. ? Pace yourself and do not try to go too fast. ? Purse your lips while breathing out. Pursing your lips is similar to a kissing or whistling position. ? If doing exercise that uses a quick burst of effort, such as weight lifting:  Breathe in before starting the exercise.  Breathe out during the hardest part of the exercise (such as raising the weights). Where to find support You can find support for exercising with COPD from:  Your health care provider.  A pulmonary rehabilitation program.  Your local health department or community health programs.  Support groups, online or in-person. Your health care provider may be able to recommend support groups. Where to find more information You can find more information about exercising with COPD from:  American Lung Association: lung.org.  COPD Foundation: copdfoundation.org. Contact a health care provider if:  Your symptoms get worse.  You have chest pain.  You have nausea.  You have a fever.  You have trouble talking or catching your breath.  You want to start a new exercise program or a new activity. Summary  COPD is a general term that can   be used to describe many different lung problems that cause lung swelling (inflammation) and limit airflow. This includes chronic bronchitis and emphysema.  Exercise and physical activity improve your shortness of breath by increasing blood flow (circulation). This causes your heart to provide more  oxygen to your body.  Contact your health care provider before starting any exercise program or new activity. Ask your health care provider what exercises and activities are safe for you. This information is not intended to replace advice given to you by your health care provider. Make sure you discuss any questions you have with your health care provider. Document Released: 10/07/2017 Document Revised: 10/07/2017 Document Reviewed: 10/07/2017 Elsevier Interactive Patient Education  2019 [place] DASH stands for "Dietary Approaches to Stop Hypertension." The DASH eating plan is a healthy eating plan that has been shown to reduce high blood pressure (hypertension). It may also reduce your risk for type 2 diabetes, heart disease, and stroke. The DASH eating plan may also help with weight loss. What are tips for following this plan?  General guidelines  Avoid eating more than 2,300 mg (milligrams) of salt (sodium) a day. If you have hypertension, you may need to reduce your sodium intake to 1,500 mg a day.  Limit alcohol intake to no more than 1 drink a day for nonpregnant women and 2 drinks a day for men. One drink equals 12 oz of beer, 5 oz of wine, or 1 oz of hard liquor.  Work with your health care provider to maintain a healthy body weight or to lose weight. Ask what an ideal weight is for you.  Get at least 30 minutes of exercise that causes your heart to beat faster (aerobic exercise) most days of the week. Activities may include walking, swimming, or biking.  Work with your health care provider or diet and nutrition specialist (dietitian) to adjust your eating plan to your individual calorie needs. Reading food labels   Check food labels for the amount of sodium per serving. Choose foods with less than 5 percent of the Daily Value of sodium. Generally, foods with less than 300 mg of sodium per serving fit into this eating plan.  To find whole grains, look for  the word "whole" as the first word in the ingredient list. Shopping  Buy products labeled as "low-sodium" or "no salt added."  Buy fresh foods. Avoid canned foods and premade or frozen meals. Cooking  Avoid adding salt when cooking. Use salt-free seasonings or herbs instead of table salt or sea salt. Check with your health care provider or pharmacist before using salt substitutes.  Do not fry foods. Cook foods using healthy methods such as baking, boiling, grilling, and broiling instead.  Cook with heart-healthy oils, such as olive, canola, soybean, or sunflower oil. Meal planning  Eat a balanced diet that includes: ? 5 or more servings of fruits and vegetables each day. At each meal, try to fill half of your plate with fruits and vegetables. ? Up to 6-8 servings of whole grains each day. ? Less than 6 oz of lean meat, poultry, or fish each day. A 3-oz serving of meat is about the same size as a deck of cards. One egg equals 1 oz. ? 2 servings of low-fat dairy each day. ? A serving of nuts, seeds, or beans 5 times each week. ? Heart-healthy fats. Healthy fats called Omega-3 fatty acids are found in foods such as flaxseeds and coldwater fish, like sardines, salmon,  and mackerel.  Limit how much you eat of the following: ? Canned or prepackaged foods. ? Food that is high in trans fat, such as fried foods. ? Food that is high in saturated fat, such as fatty meat. ? Sweets, desserts, sugary drinks, and other foods with added sugar. ? Full-fat dairy products.  Do not salt foods before eating.  Try to eat at least 2 vegetarian meals each week.  Eat more home-cooked food and less restaurant, buffet, and fast food.  When eating at a restaurant, ask that your food be prepared with less salt or no salt, if possible. What foods are recommended? The items listed may not be a complete list. Talk with your dietitian about what dietary choices are best for you. Grains Whole-grain or  whole-wheat bread. Whole-grain or whole-wheat pasta. Brown rice. [place]. Bulgur. Whole-grain and low-sodium cereals. Pita bread. Low-fat, low-sodium crackers. Whole-wheat flour tortillas. Vegetables Fresh or frozen vegetables (raw, steamed, roasted, or grilled). Low-sodium or reduced-sodium tomato and vegetable juice. Low-sodium or reduced-sodium tomato sauce and tomato paste. Low-sodium or reduced-sodium canned vegetables. Fruits All fresh, dried, or frozen fruit. Canned fruit in natural juice (without added sugar). Meat and other protein foods Skinless chicken or [place]. Ground chicken or [place]. Pork with fat trimmed off. Fish and seafood. Egg whites. Dried beans, peas, or lentils. Unsalted nuts, nut butters, and seeds. Unsalted canned beans. Lean cuts of beef with fat trimmed off. Low-sodium, lean deli meat. Dairy Low-fat (1%) or fat-free (skim) milk. Fat-free, low-fat, or reduced-fat cheeses. Nonfat, low-sodium ricotta or cottage cheese. Low-fat or nonfat yogurt. Low-fat, low-sodium cheese. Fats and oils Soft margarine without trans fats. Vegetable oil. Low-fat, reduced-fat, or light mayonnaise and salad dressings (reduced-sodium). Canola, safflower, olive, soybean, and sunflower oils. Avocado. Seasoning and other foods Herbs. Spices. Seasoning mixes without salt. Unsalted popcorn and pretzels. Fat-free sweets. What foods are not recommended? The items listed may not be a complete list. Talk with your dietitian about what dietary choices are best for you. Grains Baked goods made with fat, such as croissants, muffins, or some breads. Dry pasta or rice meal packs. Vegetables Creamed or fried vegetables. Vegetables in a cheese sauce. Regular canned vegetables (not low-sodium or reduced-sodium). Regular canned tomato sauce and paste (not low-sodium or reduced-sodium). Regular tomato and vegetable juice (not low-sodium or reduced-sodium). Angie Fava. Olives. Fruits Canned fruit in a light  or heavy syrup. Fried fruit. Fruit in cream or butter sauce. Meat and other protein foods Fatty cuts of meat. Ribs. Fried meat. Berniece Salines. Sausage. Bologna and other processed lunch meats. Salami. Fatback. Hotdogs. Bratwurst. Salted nuts and seeds. Canned beans with added salt. Canned or smoked fish. Whole eggs or egg yolks. Chicken or [place] with skin. Dairy Whole or 2% milk, cream, and half-and-half. Whole or full-fat cream cheese. Whole-fat or sweetened yogurt. Full-fat cheese. Nondairy creamers. Whipped toppings. Processed cheese and cheese spreads. Fats and oils Butter. Stick margarine. Lard. Shortening. Ghee. Bacon fat. Tropical oils, such as coconut, palm kernel, or palm oil. Seasoning and other foods Salted popcorn and pretzels. Onion salt, garlic salt, seasoned salt, table salt, and sea salt. Worcestershire sauce. Tartar sauce. Barbecue sauce. Teriyaki sauce. Soy sauce, including reduced-sodium. Steak sauce. Canned and packaged gravies. Fish sauce. Oyster sauce. Cocktail sauce. Horseradish that you find on the shelf. Ketchup. Mustard. Meat flavorings and tenderizers. Bouillon cubes. Hot sauce and Tabasco sauce. Premade or packaged marinades. Premade or packaged taco seasonings. Relishes. Regular salad dressings. Where to find more information:  National Heart,  Lung, and Blood Institute: https://wilson-eaton.com/  American Heart Association: www.heart.org Summary  The DASH eating plan is a healthy eating plan that has been shown to reduce high blood pressure (hypertension). It may also reduce your risk for type 2 diabetes, heart disease, and stroke.  With the DASH eating plan, you should limit salt (sodium) intake to 2,300 mg a day. If you have hypertension, you may need to reduce your sodium intake to 1,500 mg a day.  When on the DASH eating plan, aim to eat more fresh fruits and vegetables, whole grains, lean proteins, low-fat dairy, and heart-healthy fats.  Work with your health care provider or  diet and nutrition specialist (dietitian) to adjust your eating plan to your individual calorie needs. This information is not intended to replace advice given to you by your health care provider. Make sure you discuss any questions you have with your health care provider. Document Released: 09/03/2011 Document Revised: 09/07/2016 Document Reviewed: 09/07/2016 Elsevier Interactive Patient Education  2019 [employer].

## 2018-12-02 NOTE — Assessment & Plan Note (Signed)
Educated on [place] and recommend checking BP at home daily and documenting for provider.  Return in 4 weeks with log and for follow-up.  If continue elevation at home and office will initiate medication.

## 2018-12-02 NOTE — Progress Notes (Signed)
BP (!) 147/92 (BP Location: Left Arm, Patient Position: Sitting)   Pulse 77   Temp 98 F (36.7 C) (Oral)   Ht 5' (1.524 m)   Wt 244 lb 12.8 oz (111 kg)   LMP  (LMP Unknown)   SpO2 97%   BMI 47.81 kg/m    Subjective:    Patient ID: Dorothy Cunningham, female    DOB: Apr 14, 1961, 58 y.o.   MRN: [id number]  HPI: Dorothy Cunningham is a 58 y.o. female  Chief Complaint  Patient presents with  . Shortness of Breath    Experiencing a lot of shortness of breath doing ADL's sometimes  . Obesity   COPD Reports having some SOB occasionally with ADL's.  No SOB with walking short distances in house.  States recently walked one mile at work and at end of the mile she felt slightly "out of breath" and had to sit + her knees hurt.  States "I probably need to lose some weight" and "that will make a difference for knees and breathing".  Endorses she has not been using Symbicort, currently taking it "once a day at best and sometimes none".  Has not been using Albuterol.  She quit smoking 4 years ago.  Denies diaphoresis, CP, N&V, or fatigue. COPD status: stable Satisfied with current treatment?: yes Oxygen use: no Dyspnea frequency: about twice a week Cough frequency: none Rescue inhaler frequency:  none Limitation of activity: at times  Productive cough: none Last Spirometry: Today FEV1 85% Pneumovax: not at this time Influenza: Up to Date   OBESITY: At length discussion on diet and exercise regimen to help achieve weight loss.  Discussed focusing on small aspects at at time versus everything at once.  She endorses poor diet choices.  HYPERTENSION BP with elevation in office today on initial and repeat.  She endorses poor diet choices, eating 4 pieces of bacon every morning and enjoying salty foods.  No current BP medications. Hypertension status: noted on exam today Recurrent headaches: no Visual changes: no Palpitations: no Dyspnea: only occasional with more strenuous  acitivity Chest pain: no Lower extremity edema: no Dizzy/lightheaded: no  Relevant past medical, surgical, family and social history reviewed and updated as indicated. Interim medical history since our last visit reviewed. Allergies and medications reviewed and updated.  Review of Systems  Constitutional: Negative for activity change, appetite change, diaphoresis, fatigue and fever.  Respiratory: Positive for shortness of breath (intermittent with more strenuous activity). Negative for cough and chest tightness.   Cardiovascular: Negative for chest pain, palpitations and leg swelling.  Gastrointestinal: Negative for abdominal distention, abdominal pain, constipation, diarrhea, nausea and vomiting.  Endocrine: Negative for cold intolerance, heat intolerance, polydipsia, polyphagia and polyuria.  Neurological: Negative for dizziness, syncope, weakness, light-headedness, numbness and headaches.  Psychiatric/Behavioral: Negative.     Per HPI unless specifically indicated above     Objective:    BP (!) 147/92 (BP Location: Left Arm, Patient Position: Sitting)   Pulse 77   Temp 98 F (36.7 C) (Oral)   Ht 5' (1.524 m)   Wt 244 lb 12.8 oz (111 kg)   LMP  (LMP Unknown)   SpO2 97%   BMI 47.81 kg/m   Wt Readings from Last 3 Encounters:  12/02/18 244 lb 12.8 oz (111 kg)  09/16/18 241 lb 9.6 oz (109.6 kg)  08/24/18 237 lb 14.4 oz (107.9 kg)    Physical Exam Vitals signs and nursing note reviewed.  Constitutional:  Appearance: She is well-developed.  HENT:     Head: Normocephalic.  Eyes:     General:        Right eye: No discharge.        Left eye: No discharge.     Conjunctiva/sclera: Conjunctivae normal.     Pupils: Pupils are equal, round, and reactive to light.  Neck:     Musculoskeletal: Normal range of motion and neck supple.     Thyroid: No thyromegaly.     Vascular: No carotid bruit or JVD.  Cardiovascular:     Rate and Rhythm: Normal rate and regular rhythm.      Heart sounds: Normal heart sounds. No murmur. No gallop.   Pulmonary:     Effort: Pulmonary effort is normal.     Breath sounds: Normal breath sounds.     Comments: Clear throughout with no adventitious sounds or accessory muscle use. Abdominal:     General: Bowel sounds are normal.     Palpations: Abdomen is soft.  Musculoskeletal:     Right lower leg: No edema.     Left lower leg: No edema.  Lymphadenopathy:     Cervical: No cervical adenopathy.  Skin:    General: Skin is warm and dry.  Neurological:     Mental Status: She is alert and oriented to person, place, and time.  Psychiatric:        Mood and Affect: Mood normal.        Behavior: Behavior normal.        Thought Content: Thought content normal.        Judgment: Judgment normal.     Results for orders placed or performed in visit on 08/24/18  Microscopic Examination  Result Value Ref Range   WBC, UA 11-30 (A) 0 - 5 /hpf   RBC, UA 0-2 0 - 2 /hpf   Epithelial Cells (non renal) 0-10 0 - 10 /hpf   Bacteria, UA Moderate (A) None seen/Few  Urine Culture, Reflex  Result Value Ref Range   Urine Culture, Routine Final report    Organism ID, Bacteria Comment   CBC with Differential/Platelet  Result Value Ref Range   WBC 7.7 3.4 - 10.8 x10E3/uL   RBC 4.38 3.77 - 5.28 x10E6/uL   Hemoglobin 14.1 11.1 - 15.9 g/dL   Hematocrit 40.3 34.0 - 46.6 %   MCV 92 79 - 97 fL   MCH 32.2 26.6 - 33.0 pg   MCHC 35.0 31.5 - 35.7 g/dL   RDW 12.8 12.3 - 15.4 %   Platelets 273 150 - 450 x10E3/uL   Neutrophils 50 Not Estab. %   Lymphs 42 Not Estab. %   Monocytes 5 Not Estab. %   Eos 2 Not Estab. %   Basos 1 Not Estab. %   Neutrophils Absolute 3.9 1.4 - 7.0 x10E3/uL   Lymphocytes Absolute 3.2 (H) 0.7 - 3.1 x10E3/uL   Monocytes Absolute 0.4 0.1 - 0.9 x10E3/uL   EOS (ABSOLUTE) 0.1 0.0 - 0.4 x10E3/uL   Basophils Absolute 0.1 0.0 - 0.2 x10E3/uL   Immature Granulocytes 0 Not Estab. %   Immature Grans (Abs) 0.0 0.0 - 0.1 x10E3/uL   Comprehensive metabolic panel  Result Value Ref Range   Glucose 86 65 - 99 mg/dL   BUN 13 6 - 24 mg/dL   Creatinine, Ser 1.11 (H) 0.57 - 1.00 mg/dL   GFR calc non Af Amer 54 (L) >59 mL/min/1.73   GFR calc Af Amer 63 >59 mL/min/1.73  BUN/Creatinine Ratio 12 9 - 23   Sodium 144 134 - 144 mmol/L   Potassium 4.1 3.5 - 5.2 mmol/L   Chloride 102 96 - 106 mmol/L   CO2 22 20 - 29 mmol/L   Calcium 9.6 8.7 - 10.2 mg/dL   Total Protein 6.9 6.0 - 8.5 g/dL   Albumin 4.3 3.5 - 5.5 g/dL   Globulin, Total 2.6 1.5 - 4.5 g/dL   Albumin/Globulin Ratio 1.7 1.2 - 2.2   Bilirubin Total 0.4 0.0 - 1.2 mg/dL   Alkaline Phosphatase 55 39 - 117 IU/L   AST 17 0 - 40 IU/L   ALT 25 0 - 32 IU/L  Lipid Panel w/o Chol/HDL Ratio  Result Value Ref Range   Cholesterol, Total 225 (H) 100 - 199 mg/dL   Triglycerides 198 (H) 0 - 149 mg/dL   HDL 32 (L) >39 mg/dL   VLDL Cholesterol Cal 40 5 - 40 mg/dL   LDL Calculated 153 (H) 0 - 99 mg/dL  TSH  Result Value Ref Range   TSH 2.030 0.450 - 4.500 uIU/mL  UA/M w/rflx Culture, Routine  Result Value Ref Range   Specific Gravity, UA >1.030 (H) 1.005 - 1.030   pH, UA 5.5 5.0 - 7.5   Color, UA Yellow Yellow   Appearance Ur Hazy (A) Clear   Leukocytes, UA 1+ (A) Negative   Protein, UA Trace (A) Negative/Trace   Glucose, UA Negative Negative   Ketones, UA Trace (A) Negative   RBC, UA Trace (A) Negative   Bilirubin, UA Negative Negative   Urobilinogen, Ur 0.2 0.2 - 1.0 mg/dL   Nitrite, UA Negative Negative   Microscopic Examination See below:    Urinalysis Reflex Comment       Assessment & Plan:   Problem List Items Addressed This Visit      Respiratory   COPD (chronic obstructive pulmonary disease) (HCC) - Primary    Chronic, ongoing.  FEV1 85% today (baseline spirometry).  Recommend she use inhalers as ordered, with maintenance 2 puffs twice a day and Albuterol as needed for activity with SOB or wheezing.  She has not been using as ordered.        Relevant  Orders   Spirometry with graph (Completed)     Other   Obesity    Educated at length on healthy diet choices.  Recommend continued focus on health diet choices and regular physical activity (30 minutes 5 days a week).  Declines referral to nutrition or weight loss clinic at this time.      Elevated BP without diagnosis of hypertension    Educated on DASH diet and recommend checking BP at home daily and documenting for provider.  Return in 4 weeks with log and for follow-up.  If continue elevation at home and office will initiate medication.          Follow up plan: Return in about 4 weeks (around 12/30/2018).

## 2018-12-02 NOTE — Assessment & Plan Note (Signed)
Chronic, ongoing.  FEV1 85% today (baseline spirometry).  Recommend she use inhalers as ordered, with maintenance 2 puffs twice a day and Albuterol as needed for activity with SOB or wheezing.  She has not been using as ordered.

## 2018-12-03 ENCOUNTER — Other Ambulatory Visit: Payer: Self-pay | Admitting: Unknown Physician Specialty

## 2018-12-05 NOTE — Telephone Encounter (Signed)
Requested Prescriptions  Pending Prescriptions Disp Refills  . albuterol (PROVENTIL HFA;VENTOLIN HFA) 108 (90 Base) MCG/ACT inhaler [Pharmacy Med Name: ALBUTEROL HFA INH (200 PUFFS) 6.7GM] 6.7 g 3    Sig: INHALE 2 PUFFS INTO THE LUNGS EVERY 6 HOURS AS NEEDED FOR WHEEZING OR SHORTNESS OF BREATH     Pulmonology:  Beta Agonists Failed - 12/03/2018  9:20 AM      Failed - One inhaler should last at least one month. If the patient is requesting refills earlier, contact the patient to check for uncontrolled symptoms.      Passed - Valid encounter within last 12 months    Recent Outpatient Visits          3 days ago Chronic bronchitis, unspecified chronic bronchitis type ([place])   [place], [place] T, NP   2 months ago Acute non-recurrent maxillary sinusitis   [place], [place] T, NP   3 months ago Current moderate episode of major depressive disorder without prior episode [facility])   [facility] Merrie Roof [place], [place]   6 months ago Viral gastroenteritis   [facility] Carles Collet M, PA-C   6 months ago Current moderate episode of major depressive disorder without prior episode [facility])   [place], Rachel Elizabeth, PA-C      Future Appointments            In 3 weeks Cannady, Barbaraann Faster, NP [employer], [place]   In 3 months Cannady, Barbaraann Faster, NP [employer], [place]   In 8 months [place], Barbaraann Faster, NP [employer], [facility]         . fluticasone (FLONASE) 50 MCG/ACT nasal spray [Pharmacy Med Name: FLUTICASONE 50MCG NASAL SP (120) RX] 16 g 6    Sig: SHAKE LIQUID AND USE 2 SPRAYS IN EACH NOSTRIL DAILY     Ear, Nose, and Throat: Nasal Preparations - Corticosteroids Passed - 12/03/2018  9:20 AM      Passed - Valid encounter within last 12 months    Recent Outpatient Visits          3 days ago Chronic bronchitis, unspecified chronic bronchitis type ([place])   [place], Jolene T, NP   2 months ago Acute non-recurrent maxillary sinusitis   [place], Igo T, NP   3 months ago Current moderate episode of major depressive disorder without prior episode [facility])   [place], [place], [place]   6 months ago Viral gastroenteritis   [facility] Terrilee Croak, Adriana M, PA-C   6 months ago Current moderate episode of major depressive disorder without prior episode [facility])   [place], Lilia Argue, PA-C      Future Appointments            In 3 weeks Cannady, Barbaraann Faster, NP [employer], [place]   In 3 months Cannady, Barbaraann Faster, NP [employer], [place]   In 8 months [place], Barbaraann Faster, NP [employer], [facility]

## 2018-12-30 ENCOUNTER — Ambulatory Visit: Payer: Federal, State, Local not specified - PPO | Admitting: Nurse Practitioner

## 2019-03-04 ENCOUNTER — Other Ambulatory Visit: Payer: Self-pay | Admitting: Family Medicine

## 2019-03-04 NOTE — Telephone Encounter (Signed)
Requested Prescriptions  Pending Prescriptions Disp Refills  . atorvastatin (LIPITOR) 20 MG tablet [Pharmacy Med Name: ATORVASTATIN 20MG  TABLETS] 90 tablet 1    Sig: TAKE 1 TABLET(20 MG) BY MOUTH DAILY     Cardiovascular:  Antilipid - Statins Failed - 03/04/2019  7:37 AM      Failed - Total Cholesterol in normal range and within 360 days    Cholesterol, Total  Date Value Ref Range Status  08/24/2018 225 (H) 100 - 199 mg/dL Final         Failed - LDL in normal range and within 360 days    LDL Calculated  Date Value Ref Range Status  08/24/2018 153 (H) 0 - 99 mg/dL Final         Failed - HDL in normal range and within 360 days    HDL  Date Value Ref Range Status  08/24/2018 32 (L) >39 mg/dL Final         Failed - Triglycerides in normal range and within 360 days    Triglycerides  Date Value Ref Range Status  08/24/2018 198 (H) 0 - 149 mg/dL Final         Passed - Patient is not pregnant      Passed - Valid encounter within last 12 months    Recent Outpatient Visits          3 months ago Chronic bronchitis, unspecified chronic bronchitis type ([place])   [place], Jolene T, NP   5 months ago Acute non-recurrent maxillary sinusitis   [facility] [place], [place] T, NP   6 months ago Current moderate episode of major depressive disorder without prior episode [facility])   [place], Rachel [place], [place]   9 months ago Viral gastroenteritis   [facility] Terrilee Croak, Adriana M, PA-C   9 months ago Current moderate episode of major depressive disorder without prior episode [facility])   [place], Lilia Argue, PA-C      Future Appointments            In 3 weeks Cannady, Barbaraann Faster, NP [employer], [place]   In 5 months [place], Barbaraann Faster, NP [employer], [facility]

## 2019-03-24 ENCOUNTER — Ambulatory Visit: Payer: Federal, State, Local not specified - PPO | Admitting: Nurse Practitioner

## 2019-03-28 ENCOUNTER — Ambulatory Visit: Payer: Federal, State, Local not specified - PPO | Admitting: Nurse Practitioner

## 2019-04-22 ENCOUNTER — Other Ambulatory Visit: Payer: Self-pay | Admitting: Family Medicine

## 2019-04-22 NOTE — Telephone Encounter (Signed)
Requested Prescriptions  Pending Prescriptions Disp Refills  . diclofenac sodium (VOLTAREN) 1 % GEL [Pharmacy Med Name: DICLOFENAC 1% GEL 100GM] 100 g 3    Sig: APPLY 2 GRAMS EXTERNALLY TO THE AFFECTED AREA FOUR TIMES DAILY     Analgesics:  Topicals Passed - 04/22/2019  8:39 AM      Passed - Valid encounter within last 12 months    Recent Outpatient Visits          4 months ago Chronic bronchitis, unspecified chronic bronchitis type ([place])   [facility] [place], Jolene T, NP   7 months ago Acute non-recurrent maxillary sinusitis   [place], [place] T, NP   8 months ago Current moderate episode of major depressive disorder without prior episode [facility])   [facility] Merrie Roof [place], [place]   10 months ago Viral gastroenteritis   [facility] Terrilee Croak, Adriana M, PA-C   11 months ago Current moderate episode of major depressive disorder without prior episode [facility])   [place], Lilia Argue, PA-C      Future Appointments            In 4 months Cannady, Barbaraann Faster, NP [employer], [facility]

## 2019-05-25 ENCOUNTER — Other Ambulatory Visit: Payer: Self-pay | Admitting: Family Medicine

## 2019-05-25 NOTE — Telephone Encounter (Signed)
Requested medication (s) are due for refill today: yes Requested medication (s) are on the active medication list: yes Last refill: 05/20/2018  Future visit scheduled: yes  Notes to clinic: review for refill  Requested Prescriptions  Pending Prescriptions Disp Refills   budesonide-formoterol (SYMBICORT) 160-4.5 MCG/ACT inhaler [Pharmacy Med Name: BUDESONIDE/FORM 160/4.5MCG(120 INH)] 30.6 g 11    Sig: INHALE 2 PUFFS INTO THE LUNGS TWICE DAILY     There is no refill protocol information for this order

## 2019-05-25 NOTE — Telephone Encounter (Signed)
Appointment scheduled in November.

## 2019-06-26 ENCOUNTER — Other Ambulatory Visit: Payer: Self-pay

## 2019-06-26 MED ORDER — FLUTICASONE PROPIONATE 50 MCG/ACT NA SUSP: NASAL | 6 refills | Status: DC

## 2019-06-26 NOTE — Telephone Encounter (Signed)
Upcoming appointment 08/28/19

## 2019-06-30 ENCOUNTER — Other Ambulatory Visit: Payer: Self-pay

## 2019-06-30 ENCOUNTER — Ambulatory Visit (INDEPENDENT_AMBULATORY_CARE_PROVIDER_SITE_OTHER): Payer: Federal, State, Local not specified - PPO

## 2019-06-30 DIAGNOSIS — Z23 Encounter for immunization: Secondary | ICD-10-CM | POA: Diagnosis not present

## 2019-08-28 ENCOUNTER — Encounter: Payer: Federal, State, Local not specified - PPO | Admitting: Nurse Practitioner

## 2019-09-30 ENCOUNTER — Other Ambulatory Visit: Payer: Self-pay | Admitting: Nurse Practitioner

## 2019-10-01 NOTE — Telephone Encounter (Signed)
Requested medication (s) are due for refill today: yes  Requested medication (s) are on the active medication list:yes  Last refill:  06/03/2019  Future visit scheduled:no  Notes to clinic:  Patient had appointment on 08/28/2019 that was canceled  Review for refill   Requested Prescriptions  Pending Prescriptions Disp Refills   atorvastatin (LIPITOR) 20 MG tablet [Pharmacy Med Name: ATORVASTATIN 20MG  TABLETS] 90 tablet 1    Sig: TAKE 1 TABLET(20 MG) BY MOUTH DAILY      Cardiovascular:  Antilipid - Statins Failed - 09/30/2019  4:14 PM      Failed - Total Cholesterol in normal range and within 360 days    Cholesterol, Total  Date Value Ref Range Status  08/24/2018 225 (H) 100 - 199 mg/dL Final          Failed - LDL in normal range and within 360 days    LDL Calculated  Date Value Ref Range Status  08/24/2018 153 (H) 0 - 99 mg/dL Final          Failed - HDL in normal range and within 360 days    HDL  Date Value Ref Range Status  08/24/2018 32 (L) >39 mg/dL Final          Failed - Triglycerides in normal range and within 360 days    Triglycerides  Date Value Ref Range Status  08/24/2018 198 (H) 0 - 149 mg/dL Final          Passed - Patient is not pregnant      Passed - Valid encounter within last 12 months    Recent Outpatient Visits           10 months ago Chronic bronchitis, unspecified chronic bronchitis type ([place])   [place], Henrine Screws T, NP   1 year ago Acute non-recurrent maxillary sinusitis   [facility] [place], [place] T, NP   1 year ago Current moderate episode of major depressive disorder without prior episode ([place])   [place], Rachel Elizabeth, PA-C   1 year ago Viral gastroenteritis   [facility] Terrilee Croak, Adriana M, PA-C   1 year ago Current moderate episode of major depressive disorder without prior episode [facility])   [place], Lilia Argue, PA-C                 diclofenac Sodium (VOLTAREN) 1 % GEL [Pharmacy Med Name: DICLOFENAC 1% GEL 100GM] 100 g     Sig: APPLY 2 GRAMS EXTERNALLY TO THE AFFECTED AREA FOUR TIMES DAILY      Analgesics:  Topicals Passed - 09/30/2019  4:14 PM      Passed - Valid encounter within last 12 months    Recent Outpatient Visits           10 months ago Chronic bronchitis, unspecified chronic bronchitis type ([place])   [place], Henrine Screws T, NP   1 year ago Acute non-recurrent maxillary sinusitis   [place], [place] T, NP   1 year ago Current moderate episode of major depressive disorder without prior episode [facility])   [place], Rachel [place], [place]   1 year ago Viral gastroenteritis   [facility] [place], Adriana M, PA-C   1 year ago Current moderate episode of major depressive disorder without prior episode [facility])   [facility] [place], [place]

## 2019-10-02 NOTE — Telephone Encounter (Signed)
Appt scheduled

## 2019-10-02 NOTE — Telephone Encounter (Signed)
Needs office visit.  Will refill enough to last until that time.

## 2019-10-02 NOTE — Telephone Encounter (Signed)
Routing to provider  

## 2019-10-03 ENCOUNTER — Other Ambulatory Visit: Payer: Self-pay

## 2019-10-03 MED ORDER — BUPROPION HCL ER (XL) 300 MG PO TB24: ORAL_TABLET | ORAL | 3 refills | Status: DC

## 2019-10-03 NOTE — Telephone Encounter (Signed)
Upcoming appointment 10/06/19

## 2019-10-06 ENCOUNTER — Other Ambulatory Visit: Payer: Self-pay

## 2019-10-06 ENCOUNTER — Ambulatory Visit (INDEPENDENT_AMBULATORY_CARE_PROVIDER_SITE_OTHER): Payer: Federal, State, Local not specified - PPO | Admitting: Nurse Practitioner

## 2019-10-06 ENCOUNTER — Encounter: Payer: Self-pay | Admitting: Nurse Practitioner

## 2019-10-06 VITALS — BP 130/89 | HR 94

## 2019-10-06 DIAGNOSIS — J432 Centrilobular emphysema: Secondary | ICD-10-CM

## 2019-10-06 DIAGNOSIS — E782 Mixed hyperlipidemia: Secondary | ICD-10-CM | POA: Diagnosis not present

## 2019-10-06 DIAGNOSIS — E559 Vitamin D deficiency, unspecified: Secondary | ICD-10-CM | POA: Diagnosis not present

## 2019-10-06 DIAGNOSIS — F330 Major depressive disorder, recurrent, mild: Secondary | ICD-10-CM | POA: Diagnosis not present

## 2019-10-06 MED ORDER — ATORVASTATIN CALCIUM 20 MG PO TABS: ORAL_TABLET | ORAL | 3 refills | Status: DC

## 2019-10-06 NOTE — Assessment & Plan Note (Signed)
Chronic, stable on Wellbutrin.  Continue current regimen and adjust as needed.  Denies SI/HI.  Recent refills sent.   

## 2019-10-06 NOTE — Progress Notes (Signed)
BP 130/89   Pulse 94   LMP  (LMP Unknown)    Subjective:    Patient ID: Dorothy Cunningham, female    DOB: Mar 10, 1961, 59 y.o.   MRN: [id number]  HPI: Dorothy Cunningham is a 59 y.o. female  Chief Complaint  Patient presents with  . Depression  . Hyperlipidemia    . This visit was completed via telephone due to the restrictions of the COVID-19 pandemic. All issues as above were discussed and addressed but no physical exam was performed. If it was felt that the patient should be evaluated in the office, they were directed there. The patient verbally consented to this visit. Patient was unable to complete an audio/visual visit due to Technical difficulties,Lack of internet. Due to the catastrophic nature of the COVID-19 pandemic, this visit was done through audio contact only. . Location of the patient: home . Location of the provider: home . Those involved with this call:  . Provider: Marnee Guarneri, DNP . CMA: Yvonna Alanis, CMA . Front Desk/Registration: Don Perking  . Time spent on call: 15 minutes on the phone discussing health concerns. 10 minutes total spent in review of patient's record and preparation of their chart.  . I verified patient identity using two factors (patient name and date of birth). Patient consents verbally to being seen via telemedicine visit today.    DEPRESSION Continues on Wellbutrin.  Does endorse with change in schedule things have been different, but she is getting back on schedule.   Mood status: stable Satisfied with current treatment?: yes Symptom severity: mild  Duration of current treatment : chronic Side effects: no Medication compliance: good compliance Psychotherapy/counseling: none Previous psychiatric medications: Wellbutrin Depressed mood: no Anxious mood: no Anhedonia: no Significant weight loss or gain: no Insomnia: none Fatigue: somewhat Feelings of worthlessness or guilt: no Impaired  concentration/indecisiveness: no Suicidal ideations: no Hopelessness: no Crying spells: no Depression screen [facility] 2/9 10/06/2019 08/24/2018 05/20/2018 02/26/2017 12/29/2016  Decreased Interest 0 0 1 1 0  Down, Depressed, Hopeless 0 0 1 0 0  PHQ - 2 Score 0 0 2 1 0  Altered sleeping 0 0 1 - -  Tired, decreased energy 3 0 3 - -  Change in appetite 1 0 2 - -  Feeling bad or failure about yourself  0 0 2 - -  Trouble concentrating 0 0 1 - -  Moving slowly or fidgety/restless 0 0 0 - -  Suicidal thoughts 0 0 0 - -  PHQ-9 Score 4 0 11 - -  Difficult doing work/chores Not difficult at all Not difficult at all - - -    HYPERLIPIDEMIA Continues on Atorvastatin.   Hyperlipidemia status: good compliance Satisfied with current treatment?  yes Side effects:  no Medication compliance: good compliance Past cholesterol meds: Atorvastatin Supplements: none Aspirin:  no The 10-year ASCVD risk score Mikey Bussing DC Jr., et al., 2013) is: 5.3%   Values used to calculate the score:     Age: 89 years     Sex: Female     Is Non-Hispanic African American: No     Diabetic: No     Tobacco smoker: No     Systolic Blood Pressure: [id number]     Is BP treated: No     HDL Cholesterol: 32 mg/dL     Total Cholesterol: 225 mg/dL Chest pain:  no Coronary artery disease:  no Family history CAD:  no Family history early CAD:  no   COPD  Symbicort daily and Albuterol as needed.  She quit smoking 4 years ago, smoked cigarettes for 30 years.   COPD status: stable Satisfied with current treatment?: yes Oxygen use: no Dyspnea frequency:  Cough frequency:  Rescue inhaler frequency: no more than once a week Limitation of activity: no Productive cough:  Last Spirometry:  March 2020 Pneumovax: Not Up To Date Influenza: Up to Date   VITAMIN D DEFICIENCY: Continues on daily supplement orally.  Denies any recent falls or fractures.  Relevant past medical, surgical, family and social history reviewed and updated as  indicated. Interim medical history since our last visit reviewed. Allergies and medications reviewed and updated.  Review of Systems  Constitutional: Negative for activity change, appetite change, diaphoresis, fatigue and fever.  Respiratory: Negative for cough, chest tightness and shortness of breath.   Cardiovascular: Negative for chest pain, palpitations and leg swelling.  Gastrointestinal: Negative.   Neurological: Negative.   Psychiatric/Behavioral: Negative.     Per HPI unless specifically indicated above     Objective:    BP 130/89   Pulse 94   LMP  (LMP Unknown)   Wt Readings from Last 3 Encounters:  12/02/18 244 lb 12.8 oz (111 kg)  09/16/18 241 lb 9.6 oz (109.6 kg)  08/24/18 237 lb 14.4 oz (107.9 kg)    Physical Exam   Unable to perform due to telephone visit only.  Results for orders placed or performed in visit on 08/24/18  Microscopic Examination   URINE  Result Value Ref Range   WBC, UA 11-30 (A) 0 - 5 /hpf   RBC, UA 0-2 0 - 2 /hpf   Epithelial Cells (non renal) 0-10 0 - 10 /hpf   Bacteria, UA Moderate (A) None seen/Few  Urine Culture, Reflex   URINE  Result Value Ref Range   Urine Culture, Routine Final report    Organism ID, Bacteria Comment   CBC with Differential/Platelet  Result Value Ref Range   WBC 7.7 3.4 - 10.8 x10E3/uL   RBC 4.38 3.77 - 5.28 x10E6/uL   Hemoglobin 14.1 11.1 - 15.9 g/dL   Hematocrit 40.3 34.0 - 46.6 %   MCV 92 79 - 97 fL   MCH 32.2 26.6 - 33.0 pg   MCHC 35.0 31.5 - 35.7 g/dL   RDW 12.8 12.3 - 15.4 %   Platelets 273 150 - 450 x10E3/uL   Neutrophils 50 Not Estab. %   Lymphs 42 Not Estab. %   Monocytes 5 Not Estab. %   Eos 2 Not Estab. %   Basos 1 Not Estab. %   Neutrophils Absolute 3.9 1.4 - 7.0 x10E3/uL   Lymphocytes Absolute 3.2 (H) 0.7 - 3.1 x10E3/uL   Monocytes Absolute 0.4 0.1 - 0.9 x10E3/uL   EOS (ABSOLUTE) 0.1 0.0 - 0.4 x10E3/uL   Basophils Absolute 0.1 0.0 - 0.2 x10E3/uL   Immature Granulocytes 0 Not Estab. %    Immature Grans (Abs) 0.0 0.0 - 0.1 x10E3/uL  Comprehensive metabolic panel  Result Value Ref Range   Glucose 86 65 - 99 mg/dL   BUN 13 6 - 24 mg/dL   Creatinine, Ser 1.11 (H) 0.57 - 1.00 mg/dL   GFR calc non Af Amer 54 (L) >59 mL/min/1.73   GFR calc Af Amer 63 >59 mL/min/1.73   BUN/Creatinine Ratio 12 9 - 23   Sodium 144 134 - 144 mmol/L   Potassium 4.1 3.5 - 5.2 mmol/L   Chloride 102 96 - 106 mmol/L   CO2 22 20 -  29 mmol/L   Calcium 9.6 8.7 - 10.2 mg/dL   Total Protein 6.9 6.0 - 8.5 g/dL   Albumin 4.3 3.5 - 5.5 g/dL   Globulin, Total 2.6 1.5 - 4.5 g/dL   Albumin/Globulin Ratio 1.7 1.2 - 2.2   Bilirubin Total 0.4 0.0 - 1.2 mg/dL   Alkaline Phosphatase 55 39 - 117 IU/L   AST 17 0 - 40 IU/L   ALT 25 0 - 32 IU/L  Lipid Panel w/o Chol/HDL Ratio  Result Value Ref Range   Cholesterol, Total 225 (H) 100 - 199 mg/dL   Triglycerides 198 (H) 0 - 149 mg/dL   HDL 32 (L) >39 mg/dL   VLDL Cholesterol Cal 40 5 - 40 mg/dL   LDL Calculated 153 (H) 0 - 99 mg/dL  TSH  Result Value Ref Range   TSH 2.030 0.450 - 4.500 uIU/mL  UA/M w/rflx Culture, Routine   Specimen: Urine   URINE  Result Value Ref Range   Specific Gravity, UA >1.030 (H) 1.005 - 1.030   pH, UA 5.5 5.0 - 7.5   Color, UA Yellow Yellow   Appearance Ur Hazy (A) Clear   Leukocytes, UA 1+ (A) Negative   Protein, UA Trace (A) Negative/Trace   Glucose, UA Negative Negative   Ketones, UA Trace (A) Negative   RBC, UA Trace (A) Negative   Bilirubin, UA Negative Negative   Urobilinogen, Ur 0.2 0.2 - 1.0 mg/dL   Nitrite, UA Negative Negative   Microscopic Examination See below:    Urinalysis Reflex Comment       Assessment & Plan:   Problem List Items Addressed This Visit      Respiratory   COPD (chronic obstructive pulmonary disease) (HCC)    Chronic, stable with FEV1 85% in March 2020.  Continue Symbicort daily and Albuterol as needed, adjust regimen as needed.  Will obtain outpatient CBC.        Relevant Orders   CBC  with Differential/Platelet out     Other   Depression - Primary    Chronic, stable on Wellbutrin.  Continue current regimen and adjust as needed.  Denies SI/HI.  Recent refills sent.        Hyperlipidemia    Chronic, stable.  Continue current medication regimen and adjust as needed.  Will obtain outpatient lipid panel.      Relevant Medications   atorvastatin (LIPITOR) 20 MG tablet   Other Relevant Orders   Comprehensive metabolic panel   72m Lipid Panel   Vitamin D deficiency    Continue daily supplement.  Check outpatient Vit D level.      Relevant Orders   Vit D  25 hydroxy (rtn osteoporosis monitoring)      I discussed the assessment and treatment plan with the patient. The patient was provided an opportunity to ask questions and all were answered. The patient agreed with the plan and demonstrated an understanding of the instructions.   The patient was advised to call back or seek an in-person evaluation if the symptoms worsen or if the condition fails to improve as anticipated.   I provided 15 minutes of time during this encounter.  Follow up plan: Return in about 6 months (around 04/04/2020) for Annual physical.

## 2019-10-06 NOTE — Assessment & Plan Note (Signed)
Chronic, stable.  Continue current medication regimen and adjust as needed.  Will obtain outpatient lipid panel.

## 2019-10-06 NOTE — Patient Instructions (Signed)

## 2019-10-06 NOTE — Assessment & Plan Note (Signed)
Chronic, stable with FEV1 85% in March 2020.  Continue Symbicort daily and Albuterol as needed, adjust regimen as needed.  Will obtain outpatient CBC.

## 2019-10-06 NOTE — Assessment & Plan Note (Signed)
Continue daily supplement.  Check outpatient Vit D level.

## 2019-10-20 ENCOUNTER — Other Ambulatory Visit: Payer: Federal, State, Local not specified - PPO

## 2019-10-20 ENCOUNTER — Other Ambulatory Visit: Payer: Self-pay

## 2019-10-20 DIAGNOSIS — E559 Vitamin D deficiency, unspecified: Secondary | ICD-10-CM

## 2019-10-20 DIAGNOSIS — E782 Mixed hyperlipidemia: Secondary | ICD-10-CM

## 2019-10-20 DIAGNOSIS — J432 Centrilobular emphysema: Secondary | ICD-10-CM

## 2019-10-20 LAB — LIPID PANEL PICCOLO, WAIVED
Chol/HDL Ratio Piccolo,Waive: 4 mg/dL
Cholesterol Piccolo, Waived: 166 mg/dL (ref <200)
HDL Chol Piccolo, Waived: 41 mg/dL — ABNORMAL LOW (ref >59)
LDL Chol Calc Piccolo Waived: 70 mg/dL (ref <100)
Triglycerides Piccolo,Waived: 276 mg/dL — ABNORMAL HIGH (ref <150)
VLDL Chol Calc Piccolo,Waive: 55 mg/dL — ABNORMAL HIGH (ref <30)

## 2019-10-21 LAB — CBC WITH DIFFERENTIAL/PLATELET
Basophils Absolute: 0.1 10*3/uL (ref 0.0–0.2)
Basos: 1 %
EOS (ABSOLUTE): 0.2 10*3/uL (ref 0.0–0.4)
Eos: 2 %
Hematocrit: 44.3 % (ref 34.0–46.6)
Hemoglobin: 15.4 g/dL (ref 11.1–15.9)
Immature Grans (Abs): 0 10*3/uL (ref 0.0–0.1)
Immature Granulocytes: 0 %
Lymphocytes Absolute: 3.2 10*3/uL — ABNORMAL HIGH (ref 0.7–3.1)
Lymphs: 31 %
MCH: 32.6 pg (ref 26.6–33.0)
MCHC: 34.8 g/dL (ref 31.5–35.7)
MCV: 94 fL (ref 79–97)
Monocytes Absolute: 0.6 10*3/uL (ref 0.1–0.9)
Monocytes: 6 %
Neutrophils Absolute: 6.3 10*3/uL (ref 1.4–7.0)
Neutrophils: 60 %
Platelets: 284 10*3/uL (ref 150–450)
RBC: 4.72 x10E6/uL (ref 3.77–5.28)
RDW: 12.9 % (ref 11.7–15.4)
WBC: 10.4 10*3/uL (ref 3.4–10.8)

## 2019-10-21 LAB — COMPREHENSIVE METABOLIC PANEL
ALT: 36 IU/L — ABNORMAL HIGH (ref 0–32)
AST: 25 IU/L (ref 0–40)
Albumin/Globulin Ratio: 1.6 (ref 1.2–2.2)
Albumin: 4.4 g/dL (ref 3.8–4.9)
Alkaline Phosphatase: 71 IU/L (ref 39–117)
BUN/Creatinine Ratio: 8 — ABNORMAL LOW (ref 12–28)
BUN: 10 mg/dL (ref 8–27)
Bilirubin Total: 0.6 mg/dL (ref 0.0–1.2)
CO2: 25 mmol/L (ref 20–29)
Calcium: 9.6 mg/dL (ref 8.7–10.3)
Chloride: 103 mmol/L (ref 96–106)
Creatinine, Ser: 1.19 mg/dL — ABNORMAL HIGH (ref 0.57–1.00)
GFR calc Af Amer: 57 mL/min/{1.73_m2} — ABNORMAL LOW (ref >59)
GFR calc non Af Amer: 50 mL/min/{1.73_m2} — ABNORMAL LOW (ref >59)
Globulin, Total: 2.7 g/dL (ref 1.5–4.5)
Glucose: 85 mg/dL (ref 65–99)
Potassium: 4.1 mmol/L (ref 3.5–5.2)
Sodium: 142 mmol/L (ref 134–144)
Total Protein: 7.1 g/dL (ref 6.0–8.5)

## 2019-10-21 LAB — VITAMIN D 25 HYDROXY (VIT D DEFICIENCY, FRACTURES): Vit D, 25-Hydroxy: 21.1 ng/mL — ABNORMAL LOW (ref 30.0–100.0)

## 2019-10-22 NOTE — Progress Notes (Signed)
Contacted via MyChart

## 2019-10-31 ENCOUNTER — Other Ambulatory Visit: Payer: Self-pay | Admitting: Nurse Practitioner

## 2019-11-03 ENCOUNTER — Other Ambulatory Visit: Payer: Self-pay | Admitting: Nurse Practitioner

## 2019-11-12 ENCOUNTER — Encounter: Payer: Self-pay | Admitting: Nurse Practitioner

## 2019-12-04 ENCOUNTER — Encounter: Payer: Self-pay | Admitting: Nurse Practitioner

## 2020-01-01 ENCOUNTER — Other Ambulatory Visit: Payer: Self-pay | Admitting: Nurse Practitioner

## 2020-01-01 ENCOUNTER — Other Ambulatory Visit: Payer: Self-pay | Admitting: Family Medicine

## 2020-01-01 ENCOUNTER — Encounter: Payer: Self-pay | Admitting: Nurse Practitioner

## 2020-01-01 NOTE — Telephone Encounter (Signed)
Requested medication (s) are due for refill today: yes  Requested medication (s) are on the active medication list: yes  Last refill:  11/23/18  Future visit scheduled: yes  Notes to clinic:  Please review for refill.    Requested Prescriptions  Pending Prescriptions Disp Refills   nystatin ointment (MYCOSTATIN) [Pharmacy Med Name: NYSTATIN OINTMENT 30GM] 60 g 3    Sig: APPLY EXTERNALLY TO THE AFFECTED AREA THREE TIMES DAILY      Off-Protocol Failed - 01/01/2020  6:03 PM      Failed - Medication not assigned to a protocol, review manually.      Passed - Valid encounter within last 12 months    Recent Outpatient Visits           2 months ago Mild episode of recurrent major depressive disorder ([place])   [place], Jolene T, NP   1 year ago Chronic bronchitis, unspecified chronic bronchitis type ([place])   [place], Barbaraann Faster, NP   1 year ago Acute non-recurrent maxillary sinusitis   [place] Dickson, [place] T, NP   1 year ago Current moderate episode of major depressive disorder without prior episode [facility])   [place], Rachel Elizabeth, PA-C   1 year ago Viral gastroenteritis   [place] Trinna Post, PA-C       Future Appointments             In 2 months [place], Barbaraann Faster, NP [employer]

## 2020-01-02 NOTE — Telephone Encounter (Signed)
Routing to provider  

## 2020-01-03 ENCOUNTER — Other Ambulatory Visit: Payer: Self-pay | Admitting: Nurse Practitioner

## 2020-01-03 NOTE — Telephone Encounter (Signed)
Continue per lab note 1/24

## 2020-02-26 ENCOUNTER — Other Ambulatory Visit: Payer: Self-pay | Admitting: Nurse Practitioner

## 2020-03-29 ENCOUNTER — Encounter: Payer: Federal, State, Local not specified - PPO | Admitting: Nurse Practitioner

## 2020-04-03 ENCOUNTER — Encounter: Payer: Self-pay | Admitting: Nurse Practitioner

## 2020-04-03 ENCOUNTER — Ambulatory Visit (INDEPENDENT_AMBULATORY_CARE_PROVIDER_SITE_OTHER): Payer: Federal, State, Local not specified - PPO | Admitting: Nurse Practitioner

## 2020-04-03 ENCOUNTER — Other Ambulatory Visit: Payer: Self-pay

## 2020-04-03 VITALS — BP 128/78 | HR 99 | Temp 98.7°F | Ht 60.24 in | Wt 258.6 lb

## 2020-04-03 DIAGNOSIS — N1831 Chronic kidney disease, stage 3a: Secondary | ICD-10-CM

## 2020-04-03 DIAGNOSIS — R030 Elevated blood-pressure reading, without diagnosis of hypertension: Secondary | ICD-10-CM | POA: Diagnosis not present

## 2020-04-03 DIAGNOSIS — F321 Major depressive disorder, single episode, moderate: Secondary | ICD-10-CM

## 2020-04-03 DIAGNOSIS — Z6841 Body Mass Index (BMI) 40.0 and over, adult: Secondary | ICD-10-CM | POA: Diagnosis not present

## 2020-04-03 DIAGNOSIS — Z87891 Personal history of nicotine dependence: Secondary | ICD-10-CM

## 2020-04-03 DIAGNOSIS — E782 Mixed hyperlipidemia: Secondary | ICD-10-CM

## 2020-04-03 DIAGNOSIS — Z1231 Encounter for screening mammogram for malignant neoplasm of breast: Secondary | ICD-10-CM

## 2020-04-03 DIAGNOSIS — J301 Allergic rhinitis due to pollen: Secondary | ICD-10-CM

## 2020-04-03 DIAGNOSIS — Z0000 Encounter for general adult medical examination without abnormal findings: Secondary | ICD-10-CM | POA: Diagnosis not present

## 2020-04-03 DIAGNOSIS — E559 Vitamin D deficiency, unspecified: Secondary | ICD-10-CM

## 2020-04-03 DIAGNOSIS — J42 Unspecified chronic bronchitis: Secondary | ICD-10-CM

## 2020-04-03 DIAGNOSIS — G4730 Sleep apnea, unspecified: Secondary | ICD-10-CM

## 2020-04-03 MED ORDER — SHINGRIX 50 MCG/0.5ML IM SUSR: 0.5000 mL | Freq: Once | INTRAMUSCULAR | 0 refills | Status: AC

## 2020-04-03 MED ORDER — BUDESONIDE-FORMOTEROL FUMARATE 160-4.5 MCG/ACT IN AERO: 2.0000 | INHALATION_SPRAY | Freq: Two times a day (BID) | RESPIRATORY_TRACT | 11 refills | Status: DC

## 2020-04-03 MED ORDER — ALBUTEROL SULFATE HFA 108 (90 BASE) MCG/ACT IN AERS: INHALATION_SPRAY | RESPIRATORY_TRACT | 3 refills | Status: DC

## 2020-04-03 NOTE — Assessment & Plan Note (Signed)
Recommended eating smaller high protein, low fat meals more frequently and exercising 30 mins a day 5 times a week with a goal of 10-15lb weight loss in the next 3 months. Patient voiced their understanding and motivation to adhere to these recommendations.  

## 2020-04-03 NOTE — Assessment & Plan Note (Signed)
Referral for lung CT cancer screening, provided pamphlet to patient on this testing.

## 2020-04-03 NOTE — Assessment & Plan Note (Signed)
Continue daily supplement and adjust as needed.  Check Vit D level today. °

## 2020-04-03 NOTE — Assessment & Plan Note (Signed)
Does not use CPAP, not interested in repeat study at this time.  Plan on repeat study in future if worsening symptoms present. 

## 2020-04-03 NOTE — Assessment & Plan Note (Signed)
Recent GFR 50.  Could consider addition of Losartan in future if ongoing CKD noted -- check urine ALB next visit.  Avoid ACE due to COPD.  CMP today.

## 2020-04-03 NOTE — Patient Instructions (Signed)
[facility]  °Address: [address], [place], [place] [address]  °Phone: [phone] ° ° °DASH Eating Plan °DASH stands for "Dietary Approaches to Stop Hypertension." The DASH eating plan is a healthy eating plan that has been shown to reduce high blood pressure (hypertension). It may also reduce your risk for type 2 diabetes, heart disease, and stroke. The DASH eating plan may also help with weight loss. °What are tips for following this plan? ° °General guidelines °· Avoid eating more than 2,300 mg (milligrams) of salt (sodium) a day. If you have hypertension, you may need to reduce your sodium intake to 1,500 mg a day. °· Limit alcohol intake to no more than 1 drink a day for nonpregnant women and 2 drinks a day for men. One drink equals 12 oz of beer, 5 oz of wine, or 1½ oz of hard liquor. °· Work with your health care provider to maintain a healthy body weight or to lose weight. Ask what an ideal weight is for you. °· Get at least 30 minutes of exercise that causes your heart to beat faster (aerobic exercise) most days of the week. Activities may include walking, swimming, or biking. °· Work with your health care provider or diet and nutrition specialist (dietitian) to adjust your eating plan to your individual calorie needs. °Reading food labels ° °· Check food labels for the amount of sodium per serving. Choose foods with less than 5 percent of the Daily Value of sodium. Generally, foods with less than 300 mg of sodium per serving fit into this eating plan. °· To find whole grains, look for the word "whole" as the first word in the ingredient list. °Shopping °· Buy products labeled as "low-sodium" or "no salt added." °· Buy fresh foods. Avoid canned foods and premade or frozen meals. °Cooking °· Avoid adding salt when cooking. Use salt-free seasonings or herbs instead of table salt or sea salt. Check with your health care provider or pharmacist before using salt  substitutes. °· Do not fry foods. Cook foods using healthy methods such as baking, boiling, grilling, and broiling instead. °· Cook with heart-healthy oils, such as olive, canola, soybean, or sunflower oil. °Meal planning °· Eat a balanced diet that includes: °? 5 or more servings of fruits and vegetables each day. At each meal, try to fill half of your plate with fruits and vegetables. °? Up to 6-8 servings of whole grains each day. °? Less than 6 oz of lean meat, poultry, or fish each day. A 3-oz serving of meat is about the same size as a deck of cards. One egg equals 1 oz. °? 2 servings of low-fat dairy each day. °? A serving of nuts, seeds, or beans 5 times each week. °? Heart-healthy fats. Healthy fats called Omega-3 fatty acids are found in foods such as flaxseeds and coldwater fish, like sardines, salmon, and mackerel. °· Limit how much you eat of the following: °? Canned or prepackaged foods. °? Food that is high in trans fat, such as fried foods. °? Food that is high in saturated fat, such as fatty meat. °? Sweets, desserts, sugary drinks, and other foods with added sugar. °? Full-fat dairy products. °· Do not salt foods before eating. °· Try to eat at least 2 vegetarian meals each week. °· Eat more home-cooked food and less restaurant, buffet, and fast food. °· When eating at a restaurant, ask that your food be prepared with less salt or   no salt, if possible. °What foods are recommended? °The items listed may not be a complete list. Talk with your dietitian about what dietary choices are best for you. °Grains °Whole-grain or whole-wheat bread. Whole-grain or whole-wheat pasta. Brown rice. Oatmeal. Quinoa. Bulgur. Whole-grain and low-sodium cereals. Pita bread. Low-fat, low-sodium crackers. Whole-wheat flour tortillas. °Vegetables °Fresh or frozen vegetables (raw, steamed, roasted, or grilled). Low-sodium or reduced-sodium tomato and vegetable juice. Low-sodium or reduced-sodium tomato sauce and tomato  paste. Low-sodium or reduced-sodium canned vegetables. °Fruits °All fresh, dried, or frozen fruit. Canned fruit in natural juice (without added sugar). °Meat and other protein foods °Skinless chicken or turkey. Ground chicken or turkey. Pork with fat trimmed off. Fish and seafood. Egg whites. Dried beans, peas, or lentils. Unsalted nuts, nut butters, and seeds. Unsalted canned beans. Lean cuts of beef with fat trimmed off. Low-sodium, lean deli meat. °Dairy °Low-fat (1%) or fat-free (skim) milk. Fat-free, low-fat, or reduced-fat cheeses. Nonfat, low-sodium ricotta or cottage cheese. Low-fat or nonfat yogurt. Low-fat, low-sodium cheese. °Fats and oils °Soft margarine without trans fats. Vegetable oil. Low-fat, reduced-fat, or light mayonnaise and salad dressings (reduced-sodium). Canola, safflower, olive, soybean, and sunflower oils. Avocado. °Seasoning and other foods °Herbs. Spices. Seasoning mixes without salt. Unsalted popcorn and pretzels. Fat-free sweets. °What foods are not recommended? °The items listed may not be a complete list. Talk with your dietitian about what dietary choices are best for you. °Grains °Baked goods made with fat, such as croissants, muffins, or some breads. Dry pasta or rice meal packs. °Vegetables °Creamed or fried vegetables. Vegetables in a cheese sauce. Regular canned vegetables (not low-sodium or reduced-sodium). Regular canned tomato sauce and paste (not low-sodium or reduced-sodium). Regular tomato and vegetable juice (not low-sodium or reduced-sodium). Pickles. Olives. °Fruits °Canned fruit in a light or heavy syrup. Fried fruit. Fruit in cream or butter sauce. °Meat and other protein foods °Fatty cuts of meat. Ribs. Fried meat. Bacon. Sausage. Bologna and other processed lunch meats. Salami. Fatback. Hotdogs. Bratwurst. Salted nuts and seeds. Canned beans with added salt. Canned or smoked fish. Whole eggs or egg yolks. Chicken or turkey with skin. °Dairy °Whole or 2% milk,  cream, and half-and-half. Whole or full-fat cream cheese. Whole-fat or sweetened yogurt. Full-fat cheese. Nondairy creamers. Whipped toppings. Processed cheese and cheese spreads. °Fats and oils °Butter. Stick margarine. Lard. Shortening. Ghee. Bacon fat. Tropical oils, such as coconut, palm kernel, or palm oil. °Seasoning and other foods °Salted popcorn and pretzels. Onion salt, garlic salt, seasoned salt, table salt, and sea salt. Worcestershire sauce. Tartar sauce. Barbecue sauce. Teriyaki sauce. Soy sauce, including reduced-sodium. Steak sauce. Canned and packaged gravies. Fish sauce. Oyster sauce. Cocktail sauce. Horseradish that you find on the shelf. Ketchup. Mustard. Meat flavorings and tenderizers. Bouillon cubes. Hot sauce and Tabasco sauce. Premade or packaged marinades. Premade or packaged taco seasonings. Relishes. Regular salad dressings. °Where to find more information: °· National Heart, Lung, and Blood Institute: www.nhlbi.nih.gov °· American Heart Association: www.heart.org °Summary °· The DASH eating plan is a healthy eating plan that has been shown to reduce high blood pressure (hypertension). It may also reduce your risk for type 2 diabetes, heart disease, and stroke. °· With the DASH eating plan, you should limit salt (sodium) intake to 2,300 mg a day. If you have hypertension, you may need to reduce your sodium intake to 1,500 mg a day. °· When on the DASH eating plan, aim to eat more fresh fruits and vegetables, whole grains, lean proteins, low-fat dairy, and   heart-healthy fats. °· Work with your health care provider or diet and nutrition specialist (dietitian) to adjust your eating plan to your individual calorie needs. °This information is not intended to replace advice given to you by your health care provider. Make sure you discuss any questions you have with your health care provider. °Document Revised: 08/27/2017 Document Reviewed: 09/07/2016 °Elsevier Patient Education © 2020 [employer]  Inc. ° °

## 2020-04-03 NOTE — Assessment & Plan Note (Signed)
Chronic, stable on Wellbutrin.  Continue current regimen and adjust as needed.  Denies SI/HI.  Recent refills sent.

## 2020-04-03 NOTE — Progress Notes (Signed)
BP 128/78 (BP Location: Left Arm)    Pulse 99    Temp 98.7 F (37.1 C) (Oral)    Ht 5' 0.24" (1.53 m)    Wt 258 lb 9.6 oz (117.3 kg)    LMP  (LMP Unknown)    SpO2 96%    BMI 50.11 kg/m    Subjective:    Patient ID: Dorothy Cunningham, female    DOB: 1961/01/09, 59 y.o.   MRN: [id number]  HPI: Dorothy Cunningham is a 59 y.o. female presenting on 04/03/2020 for comprehensive medical examination. Current medical complaints include:none  She currently lives with: husband Menopausal Symptoms: no   HYPERLIPIDEMIA Continues on Atorvastatin.   Hyperlipidemia status: good compliance Satisfied with current treatment?  yes Side effects:  no Medication compliance: good compliance Past cholesterol meds: Atorvastatin Supplements: none Aspirin:  no The 10-year ASCVD risk score Mikey Bussing DC Jr., et al., 2013) is: 3.8%   Values used to calculate the score:     Age: 57 years     Sex: Female     Is Non-Hispanic African American: No     Diabetic: No     Tobacco smoker: No     Systolic Blood Pressure: 811 mmHg     Is BP treated: No     HDL Cholesterol: 41 mg/dL     Total Cholesterol: 166 mg/dL Chest pain:  no Coronary artery disease:  no Family history CAD:  no Family history early CAD:  no   COPD Symbicort daily and Albuterol as needed.  She quit smoking 4-5 years ago, smoked cigarettes for 30 years -- started at age 25 -- smoked about 1 PPD.  Did go for sleep study in past, but does not use CPAP -- reports she did not sleep well during study -- does not think she has sleep apnea.    COPD status: stable Satisfied with current treatment?: yes Oxygen use: no Dyspnea frequency: none, only with heavy activity in the heat Cough frequency: none Rescue inhaler frequency: no more than once a week Limitation of activity: no Productive cough: none Last Spirometry:  March 2020 Pneumovax: Not Up To Date Influenza: Up to Date   VITAMIN D DEFICIENCY: Continues on daily supplement orally.   Denies any recent falls or fractures.  DEPRESSION Continues on Wellbutrin 300 MG daily.   Mood status: stable Satisfied with current treatment?: yes Symptom severity: mild  Duration of current treatment : chronic Side effects: no Medication compliance: good compliance Psychotherapy/counseling: none Previous psychiatric medications: Wellbutrin Depressed mood: no Anxious mood: no Anhedonia: no Significant weight loss or gain: no Insomnia: none Fatigue: somewhat Feelings of worthlessness or guilt: no Impaired concentration/indecisiveness: no Suicidal ideations: no Hopelessness: no Crying spells: no Depression Screen done today and results listed below:  Depression screen [facility] 2/9 04/03/2020 10/06/2019 08/24/2018 05/20/2018 02/26/2017  Decreased Interest 0 0 0 1 1  Down, Depressed, Hopeless 0 0 0 1 0  PHQ - 2 Score 0 0 0 2 1  Altered sleeping 0 0 0 1 -  Tired, decreased energy 1 3 0 3 -  Change in appetite 0 1 0 2 -  Feeling bad or failure about yourself  0 0 0 2 -  Trouble concentrating 0 0 0 1 -  Moving slowly or fidgety/restless 0 0 0 0 -  Suicidal thoughts 0 0 0 0 -  PHQ-9 Score 1 4 0 11 -  Difficult doing work/chores Not difficult at all Not difficult at all Not difficult at  all - -    The patient does not have a history of falls. I did not complete a risk assessment for falls. A plan of care for falls was not documented.   Past Medical History:  Past Medical History:  Diagnosis Date   Allergic rhinitis    Anxiety    COPD (chronic obstructive pulmonary disease) ([place])    Depression    History of epilepsy    as a teenager   Hyperlipidemia    Sleep apnea     Surgical History:  Past Surgical History:  Procedure Laterality Date   Bladder Tuck  1995   BREAST BIOPSY Left 2014   benign   PARTIAL HYSTERECTOMY  1995   due to heavy periods    Medications:  Current Outpatient Medications on File Prior to Visit  Medication Sig   atorvastatin (LIPITOR) 20 MG  tablet TAKE 1 TABLET(20 MG) BY MOUTH DAILY   buPROPion (WELLBUTRIN XL) 300 MG 24 hr tablet TAKE 1 TABLET(300 MG) BY MOUTH DAILY   Chlorpheniramine Maleate (ALLERGY RELIEF PO) Take 1 tablet by mouth as needed.   cyanocobalamin 1000 MCG tablet Take 1,000 mcg by mouth daily.   diclofenac Sodium (VOLTAREN) 1 % GEL APPLY 2 GRAMS EXTERNALLY TO THE AFFECTED AREA FOUR TIMES DAILY   fluticasone (FLONASE) 50 MCG/ACT nasal spray SHAKE LIQUID AND USE 2 SPRAYS IN EACH NOSTRIL DAILY   Glucosamine-Chondroit-Vit C-Mn (GLUCOSAMINE CHONDR 500 COMPLEX) CAPS Take 2 capsules by mouth daily.   Multiple Vitamin (MULTIVITAMIN) tablet Take 1 tablet by mouth daily.   nystatin ointment (MYCOSTATIN) APPLY EXTERNALLY TO THE AFFECTED AREA THREE TIMES DAILY   VITAMIN D PO Take by mouth.   No current facility-administered medications on file prior to visit.    Allergies:  Allergies  Allergen Reactions   Penicillins Hives    Social History:  Social History   Socioeconomic History   Marital status: Married    Spouse name: Not on file   Number of children: Not on file   Years of education: Not on file   Highest education level: Not on file  Occupational History   Not on file  Tobacco Use   Smoking status: Former Smoker    Quit date: 09/28/2014    Years since quitting: 5.5   Smokeless tobacco: Never Used  Vaping Use   Vaping Use: Former  Substance and Sexual Activity   Alcohol use: No   Drug use: No   Sexual activity: Never  Other Topics Concern   Not on file  Social History Narrative   Not on file   Social Determinants of Health   Financial Resource Strain:    Difficulty of Paying Living Expenses:   Food Insecurity:    Worried About Charity fundraiser in the Last Year:    Arboriculturist in the Last Year:   Transportation Needs:    Film/video editor (Medical):    Lack of Transportation (Non-Medical):   Physical Activity:    Days of Exercise per Week:     Minutes of Exercise per Session:   Stress:    Feeling of Stress :   Social Connections:    Frequency of Communication with Friends and Family:    Frequency of Social Gatherings with Friends and Family:    Attends Religious Services:    Active Member of Clubs or Organizations:    Attends Archivist Meetings:    Marital Status:   Intimate Partner Violence:    Fear  of Current or Ex-Partner:    Emotionally Abused:    Physically Abused:    Sexually Abused:    Social History   Tobacco Use  Smoking Status Former Smoker   Quit date: 09/28/2014   Years since quitting: 5.5  Smokeless Tobacco Never Used   Social History   Substance and Sexual Activity  Alcohol Use No    Family History:  Family History  Problem Relation Age of Onset   Thyroid disease Mother    Asthma Mother    Osteoarthritis Father    CAD Father    Diabetes Maternal Grandfather    Tuberculosis Maternal Grandfather    Diabetes Paternal Grandmother    Heart attack Paternal Grandfather    Diabetes Maternal Uncle     Past medical history, surgical history, medications, allergies, family history and social history reviewed with patient today and changes made to appropriate areas of the chart.   Review of Systems - negative All other ROS negative except what is listed above and in the HPI.      Objective:    BP 128/78 (BP Location: Left Arm)    Pulse 99    Temp 98.7 F (37.1 C) (Oral)    Ht 5' 0.24" (1.53 m)    Wt 258 lb 9.6 oz (117.3 kg)    LMP  (LMP Unknown)    SpO2 96%    BMI 50.11 kg/m   Wt Readings from Last 3 Encounters:  04/03/20 258 lb 9.6 oz (117.3 kg)  12/02/18 244 lb 12.8 oz (111 kg)  09/16/18 241 lb 9.6 oz (109.6 kg)    Physical Exam Constitutional:      General: She is awake. She is not in acute distress.    Appearance: She is well-developed and well-groomed. She is morbidly obese. She is not ill-appearing.  HENT:     Head: Normocephalic and atraumatic.      Right Ear: Hearing, tympanic membrane, ear canal and external ear normal. No drainage.     Left Ear: Hearing, tympanic membrane, ear canal and external ear normal. No drainage.     Nose: Nose normal.     Right Sinus: No maxillary sinus tenderness or frontal sinus tenderness.     Left Sinus: No maxillary sinus tenderness or frontal sinus tenderness.     Mouth/Throat:     Mouth: Mucous membranes are moist.     Pharynx: Oropharynx is clear. Uvula midline. No pharyngeal swelling, oropharyngeal exudate or posterior oropharyngeal erythema.  Eyes:     General: Lids are normal.        Right eye: No discharge.        Left eye: No discharge.     Extraocular Movements: Extraocular movements intact.     Conjunctiva/sclera: Conjunctivae normal.     Pupils: Pupils are equal, round, and reactive to light.     Visual Fields: Right eye visual fields normal and left eye visual fields normal.  Neck:     Thyroid: No thyromegaly.     Vascular: No carotid bruit.     Trachea: Trachea normal.  Cardiovascular:     Rate and Rhythm: Normal rate and regular rhythm.     Heart sounds: Normal heart sounds. No murmur heard.  No gallop.   Pulmonary:     Effort: Pulmonary effort is normal. No accessory muscle usage or respiratory distress.     Breath sounds: Normal breath sounds.  Chest:     Breasts:        Right: Normal.  Left: Normal.  Abdominal:     General: Bowel sounds are normal.     Palpations: Abdomen is soft. There is no hepatomegaly or splenomegaly.     Tenderness: There is no abdominal tenderness.  Musculoskeletal:        General: Normal range of motion.     Cervical back: Normal range of motion and neck supple.     Right lower leg: No edema.     Left lower leg: No edema.  Lymphadenopathy:     Head:     Right side of head: No submental, submandibular, tonsillar, preauricular or posterior auricular adenopathy.     Left side of head: No submental, submandibular, tonsillar, preauricular or  posterior auricular adenopathy.     Cervical: No cervical adenopathy.     Upper Body:     Right upper body: No supraclavicular, axillary or pectoral adenopathy.     Left upper body: No supraclavicular, axillary or pectoral adenopathy.  Skin:    General: Skin is warm and dry.     Capillary Refill: Capillary refill takes less than 2 seconds.     Findings: No rash.  Neurological:     Mental Status: She is alert and oriented to person, place, and time.     Cranial Nerves: Cranial nerves are intact.     Gait: Gait is intact.     Deep Tendon Reflexes: Reflexes are normal and symmetric.     Reflex Scores:      Brachioradialis reflexes are 2+ on the right side and 2+ on the left side.      Patellar reflexes are 2+ on the right side and 2+ on the left side. Psychiatric:        Attention and Perception: Attention normal.        Mood and Affect: Mood normal.        Speech: Speech normal.        Behavior: Behavior normal. Behavior is cooperative.        Thought Content: Thought content normal.        Judgment: Judgment normal.    Results for orders placed or performed in visit on 10/20/19  CBC with Differential/Platelet out  Result Value Ref Range   WBC 10.4 3.4 - 10.8 x10E3/uL   RBC 4.72 3.77 - 5.28 x10E6/uL   Hemoglobin 15.4 11.1 - 15.9 g/dL   Hematocrit 44.3 34.0 - 46.6 %   MCV 94 79 - 97 fL   MCH 32.6 26.6 - 33.0 pg   MCHC 34.8 31 - 35 g/dL   RDW 12.9 11.7 - 15.4 %   Platelets 284 150 - 450 x10E3/uL   Neutrophils 60 Not Estab. %   Lymphs 31 Not Estab. %   Monocytes 6 Not Estab. %   Eos 2 Not Estab. %   Basos 1 Not Estab. %   Neutrophils Absolute 6.3 1 - 7 x10E3/uL   Lymphocytes Absolute 3.2 (H) 0 - 3 x10E3/uL   Monocytes Absolute 0.6 0 - 0 x10E3/uL   EOS (ABSOLUTE) 0.2 0.0 - 0.4 x10E3/uL   Basophils Absolute 0.1 0 - 0 x10E3/uL   Immature Granulocytes 0 Not Estab. %   Immature Grans (Abs) 0.0 0.0 - 0.1 x10E3/uL  Vit D  25 hydroxy (rtn osteoporosis monitoring)  Result Value  Ref Range   Vit D, 25-Hydroxy 21.1 (L) 30.0 - 100.0 ng/mL  50m Lipid Panel  Result Value Ref Range   Cholesterol [place], Waived 166 <200 mg/dL   HDL Chol [place], [place]  41 (L) >59 mg/dL   Triglycerides Piccolo,Waived 276 (H) <150 mg/dL   Chol/HDL Ratio Piccolo,Waive 4.0 mg/dL   LDL Chol Calc Piccolo Waived 70 <100 mg/dL   VLDL Chol Calc Piccolo,Waive 55 (H) <30 mg/dL  Comprehensive metabolic panel  Result Value Ref Range   Glucose 85 65 - 99 mg/dL   BUN 10 8 - 27 mg/dL   Creatinine, Ser 1.19 (H) 0.57 - 1.00 mg/dL   GFR calc non Af Amer 50 (L) >59 mL/min/1.73   GFR calc Af Amer 57 (L) >59 mL/min/1.73   BUN/Creatinine Ratio 8 (L) 12 - 28   Sodium 142 134 - 144 mmol/L   Potassium 4.1 3.5 - 5.2 mmol/L   Chloride 103 96 - 106 mmol/L   CO2 25 20 - 29 mmol/L   Calcium 9.6 8.7 - 10.3 mg/dL   Total Protein 7.1 6.0 - 8.5 g/dL   Albumin 4.4 3.8 - 4.9 g/dL   Globulin, Total 2.7 1.5 - 4.5 g/dL   Albumin/Globulin Ratio 1.6 1.2 - 2.2   Bilirubin Total 0.6 0.0 - 1.2 mg/dL   Alkaline Phosphatase 71 39 - 117 IU/L   AST 25 0 - 40 IU/L   ALT 36 (H) 0 - 32 IU/L      Assessment & Plan:   Problem List Items Addressed This Visit      Respiratory   Allergic rhinitis    Chronic, stable with Flonase and allergy relief oral tablets.  Continue current medication regimen and adjust as needed.  Could consider Singulair if worsening, which may also benefit COPD.  Return in 6 months.      COPD (chronic obstructive pulmonary disease) (HCC)    Chronic, stable with FEV1 85% in March 2020.  Continue Symbicort daily and Albuterol as needed, adjust regimen as needed.  Will obtain CBC today.  Order for outpatient CT lung screening, as past smoker with 30 year smoking history -- she would like to have this screening and was given pamphlet on this.  Plan for repeat spirometry next visit.  Refills sent.      Relevant Medications   budesonide-formoterol (SYMBICORT) 160-4.5 MCG/ACT inhaler   albuterol (VENTOLIN  HFA) 108 (90 Base) MCG/ACT inhaler   Other Relevant Orders   CBC with Differential/Platelet   Sleep apnea    Does not use CPAP, not interested in repeat study at this time.  Plan on repeat study in future if worsening symptoms present.        Genitourinary   CKD (chronic kidney disease) stage 3, GFR 30-59 ml/min    Recent GFR 50.  Could consider addition of Losartan in future if ongoing CKD noted -- check urine ALB next visit.  Avoid ACE due to COPD.  CMP today.      Relevant Orders   Comprehensive metabolic panel     Other   Depression    Chronic, stable on Wellbutrin.  Continue current regimen and adjust as needed.  Denies SI/HI.  Recent refills sent.        Hyperlipidemia    Chronic, stable.  Continue current medication regimen and adjust as needed.  Lipid panel today.      Relevant Orders   Comprehensive metabolic panel   Lipid Panel w/o Chol/HDL Ratio   Vitamin D deficiency    Continue daily supplement and adjust as needed.  Check Vit D level today.      Relevant Orders   VITAMIN D 25 Hydroxy (Vit-D Deficiency, Fractures)   Morbid obesity ([place])  BMI 50.11 with COPD and HLD.  Recommended eating smaller high protein, low fat meals more frequently and exercising 30 mins a day 5 times a week with a goal of 10-15lb weight loss in the next 3 months. Patient voiced their understanding and motivation to adhere to these recommendations.       Elevated BP without diagnosis of hypertension    Educated on DASH diet and recommend checking BP at home daily and documenting for provider.  Return in 6 months with log and for follow-up.  If continue elevation at home and office will initiate medication -- ARB for kidney protection.  CMP and TSH today.      Relevant Orders   Comprehensive metabolic panel   TSH   BMI 50.0-59.9, adult (HCC)    Recommended eating smaller high protein, low fat meals more frequently and exercising 30 mins a day 5 times a week with a goal of 10-15lb  weight loss in the next 3 months. Patient voiced their understanding and motivation to adhere to these recommendations.       History of smoking    Referral for lung CT cancer screening, provided pamphlet to patient on this testing.      Relevant Orders   Ambulatory Referral for Lung Cancer Scre    Other Visit Diagnoses    Routine general medical examination at a health care facility    -  Primary   Encounter for screening mammogram for malignant neoplasm of breast       mammogram ordered   Relevant Orders   MM DIGITAL SCREENING BILATERAL       Follow up plan: Return in about 6 months (around 10/04/2020) for COPD, Depression, HLD -- needs spirometry.   LABORATORY TESTING:  - Pap smear: not applicable  IMMUNIZATIONS:   - Tdap: Tetanus vaccination status reviewed: last tetanus booster within 10 years. - Influenza: Up to date - Pneumovax: Up to date - Prevnar: Not applicable - HPV: Not applicable - Zostavax vaccine: ordered  SCREENING: -Mammogram: Ordered today  - Colonoscopy: Up to date  - Bone Density: Not applicable  -Hearing Test: Not applicable  -Spirometry: Up to date   PATIENT COUNSELING:   Advised to take 1 mg of folate supplement per day if capable of pregnancy.   Sexuality: Discussed sexually transmitted diseases, partner selection, use of condoms, avoidance of unintended pregnancy  and contraceptive alternatives.   Advised to avoid cigarette smoking.  I discussed with the patient that most people either abstain from alcohol or drink within safe limits (<=14/week and <=4 drinks/occasion for males, <=7/weeks and <= 3 drinks/occasion for females) and that the risk for alcohol disorders and other health effects rises proportionally with the number of drinks per week and how often a drinker exceeds daily limits.  Discussed cessation/primary prevention of drug use and availability of treatment for abuse.   Diet: Encouraged to adjust caloric intake to maintain  or  achieve ideal body weight, to reduce intake of dietary saturated fat and total fat, to limit sodium intake by avoiding high sodium foods and not adding table salt, and to maintain adequate dietary potassium and calcium preferably from fresh fruits, vegetables, and low-fat dairy products.    stressed the importance of regular exercise  Injury prevention: Discussed safety belts, safety helmets, smoke detector, smoking near bedding or upholstery.   Dental health: Discussed importance of regular tooth brushing, flossing, and dental visits.    NEXT PREVENTATIVE PHYSICAL DUE IN 1 YEAR. Return in about 6 months (  around 10/04/2020) for COPD, Depression, HLD -- needs spirometry.

## 2020-04-03 NOTE — Assessment & Plan Note (Signed)
Chronic, stable.  Continue current medication regimen and adjust as needed.  Lipid panel today. 

## 2020-04-03 NOTE — Assessment & Plan Note (Signed)
Chronic, stable with FEV1 85% in March 2020.  Continue Symbicort daily and Albuterol as needed, adjust regimen as needed.  Will obtain CBC today.  Order for outpatient CT lung screening, as past smoker with 30 year smoking history -- she would like to have this screening and was given pamphlet on this.  Plan for repeat spirometry next visit.  Refills sent.

## 2020-04-03 NOTE — Assessment & Plan Note (Signed)
Educated on [place] and recommend checking BP at home daily and documenting for provider.  Return in 6 months with log and for follow-up.  If continue elevation at home and office will initiate medication -- ARB for kidney protection.  CMP and TSH today.

## 2020-04-03 NOTE — Assessment & Plan Note (Addendum)
BMI 50.11 with COPD and HLD.  Recommended eating smaller high protein, low fat meals more frequently and exercising 30 mins a day 5 times a week with a goal of 10-15lb weight loss in the next 3 months. Patient voiced their understanding and motivation to adhere to these recommendations.

## 2020-04-03 NOTE — Assessment & Plan Note (Signed)
Chronic, stable with Flonase and allergy relief oral tablets.  Continue current medication regimen and adjust as needed.  Could consider Singulair if worsening, which may also benefit COPD.  Return in 6 months.

## 2020-04-04 ENCOUNTER — Other Ambulatory Visit: Payer: Self-pay | Admitting: Nurse Practitioner

## 2020-04-04 DIAGNOSIS — D72820 Lymphocytosis (symptomatic): Secondary | ICD-10-CM

## 2020-04-04 LAB — COMPREHENSIVE METABOLIC PANEL
ALT: 42 IU/L — ABNORMAL HIGH (ref 0–32)
AST: 32 IU/L (ref 0–40)
Albumin/Globulin Ratio: 1.7 (ref 1.2–2.2)
Albumin: 4.5 g/dL (ref 3.8–4.8)
Alkaline Phosphatase: 56 IU/L (ref 48–121)
BUN/Creatinine Ratio: 13 (ref 12–28)
BUN: 16 mg/dL (ref 8–27)
Bilirubin Total: 0.4 mg/dL (ref 0.0–1.2)
CO2: 22 mmol/L (ref 20–29)
Calcium: 9.6 mg/dL (ref 8.7–10.3)
Chloride: 102 mmol/L (ref 96–106)
Creatinine, Ser: 1.28 mg/dL — ABNORMAL HIGH (ref 0.57–1.00)
GFR calc Af Amer: 52 mL/min/{1.73_m2} — ABNORMAL LOW (ref >59)
GFR calc non Af Amer: 45 mL/min/{1.73_m2} — ABNORMAL LOW (ref >59)
Globulin, Total: 2.6 g/dL (ref 1.5–4.5)
Glucose: 87 mg/dL (ref 65–99)
Potassium: 4.2 mmol/L (ref 3.5–5.2)
Sodium: 138 mmol/L (ref 134–144)
Total Protein: 7.1 g/dL (ref 6.0–8.5)

## 2020-04-04 LAB — CBC WITH DIFFERENTIAL/PLATELET
Basophils Absolute: 0.1 10*3/uL (ref 0.0–0.2)
Basos: 1 %
EOS (ABSOLUTE): 0.1 10*3/uL (ref 0.0–0.4)
Eos: 1 %
Hematocrit: 44.5 % (ref 34.0–46.6)
Hemoglobin: 15.2 g/dL (ref 11.1–15.9)
Immature Grans (Abs): 0 10*3/uL (ref 0.0–0.1)
Immature Granulocytes: 0 %
Lymphocytes Absolute: 3.5 10*3/uL — ABNORMAL HIGH (ref 0.7–3.1)
Lymphs: 31 %
MCH: 32.1 pg (ref 26.6–33.0)
MCHC: 34.2 g/dL (ref 31.5–35.7)
MCV: 94 fL (ref 79–97)
Monocytes Absolute: 0.6 10*3/uL (ref 0.1–0.9)
Monocytes: 5 %
Neutrophils Absolute: 7 10*3/uL (ref 1.4–7.0)
Neutrophils: 62 %
Platelets: 295 10*3/uL (ref 150–450)
RBC: 4.73 x10E6/uL (ref 3.77–5.28)
RDW: 12.7 % (ref 11.7–15.4)
WBC: 11.3 10*3/uL — ABNORMAL HIGH (ref 3.4–10.8)

## 2020-04-04 LAB — LIPID PANEL W/O CHOL/HDL RATIO
Cholesterol, Total: 138 mg/dL (ref 100–199)
HDL: 37 mg/dL — ABNORMAL LOW (ref >39)
LDL Chol Calc (NIH): 73 mg/dL (ref 0–99)
Triglycerides: 161 mg/dL — ABNORMAL HIGH (ref 0–149)
VLDL Cholesterol Cal: 28 mg/dL (ref 5–40)

## 2020-04-04 LAB — TSH: TSH: 1.83 u[IU]/mL (ref 0.450–4.500)

## 2020-04-04 LAB — VITAMIN D 25 HYDROXY (VIT D DEFICIENCY, FRACTURES): Vit D, 25-Hydroxy: 31.2 ng/mL (ref 30.0–100.0)

## 2020-04-04 NOTE — Progress Notes (Signed)
Contacted via [place] afternoon Manus Gunning, your labs have returned: - Looking at your lymphocytes over past few years -- they were 4.2 three years ago and since then have continued to be mildly elevated but not as high, now white blood cell count mildly elevated too -- would like to recheck this via outpatient lab visit only in 6 weeks, can you please schedule this.  I will place order. - Continue to show some mild kidney disease, but no significant decline, we will continue to monitor this and the mild elevation in your one liver test (ALT). - Continue Atorvastatin daily, LDL almost at goal <70 - Thyroid and Vitamin D levels in good range. Any questions? Keep being awesome!! Kindest regards, Feliz Lincoln

## 2020-04-11 ENCOUNTER — Encounter: Payer: Self-pay | Admitting: *Deleted

## 2020-04-11 ENCOUNTER — Telehealth: Payer: Self-pay | Admitting: *Deleted

## 2020-04-11 DIAGNOSIS — Z122 Encounter for screening for malignant neoplasm of respiratory organs: Secondary | ICD-10-CM

## 2020-04-11 DIAGNOSIS — Z87891 Personal history of nicotine dependence: Secondary | ICD-10-CM

## 2020-04-11 NOTE — Telephone Encounter (Signed)
Received referral for initial lung cancer screening scan. Contacted patient and obtained smoking history,(former, quit 09/28/14, 30 pack year) as well as answering questions related to screening process. Patient denies signs of lung cancer such as weight loss or hemoptysis. Patient denies comorbidity that would prevent curative treatment if lung cancer were found. Patient is scheduled for shared decision making visit and CT scan on (TBD r/t patient scheduling preference).

## 2020-04-19 ENCOUNTER — Other Ambulatory Visit: Payer: Self-pay

## 2020-04-19 ENCOUNTER — Ambulatory Visit
Admission: RE | Admit: 2020-04-19 | Discharge: 2020-04-19 | Disposition: A | Discharge status: Home / Self Care | Payer: Federal, State, Local not specified - PPO | Source: Ambulatory Visit | Attending: Oncology | Admitting: Oncology

## 2020-04-19 ENCOUNTER — Inpatient Hospital Stay
Payer: Federal, State, Local not specified - PPO | Attending: Nurse Practitioner | Admitting: Hospice and Palliative Medicine

## 2020-04-19 DIAGNOSIS — Z87891 Personal history of nicotine dependence: Secondary | ICD-10-CM | POA: Diagnosis not present

## 2020-04-19 DIAGNOSIS — Z122 Encounter for screening for malignant neoplasm of respiratory organs: Secondary | ICD-10-CM | POA: Diagnosis not present

## 2020-04-19 NOTE — Progress Notes (Addendum)
Virtual Visit via Telephone Note  Patient was unable to connect to video and was called on the phone.   I connected with@ on 04/19/20 at@ by a telephone and verified that I am speaking with the correct person using two identifiers.   I discussed the limitations of evaluation and management by telemedicine and the availability of in person appointments. The patient expressed understanding and agreed to proceed.  In accordance with CMS guidelines, patient has met eligibility criteria including age, absence of signs or symptoms of lung cancer.  Social History   Tobacco Use  . Smoking status: Former Smoker    Packs/day: 0.75    Years: 40.00    Pack years: 30.00    Types: Cigarettes    Quit date: 09/28/2014    Years since quitting: 5.5  . Smokeless tobacco: Never Used  Vaping Use  . Vaping Use: Former  Substance Use Topics  . Alcohol use: No  . Drug use: No      A shared decision-making session was conducted prior to the performance of CT scan. This includes one or more decision aids, includes benefits and harms of screening, follow-up diagnostic testing, over-diagnosis, false positive rate, and total radiation exposure.   Counseling on the importance of adherence to annual lung cancer LDCT screening, impact of co-morbidities, and ability or willingness to undergo diagnosis and treatment is imperative for compliance of the program.   Counseling on the importance of continued smoking cessation for former smokers; the importance of smoking cessation for current smokers, and information about tobacco cessation interventions have been given to patient including [place] and [facility] quit [place] programs.   Written order for lung cancer screening with LDCT has been given to the patient and any and all questions have been answered to the best of my abilities.    Yearly follow up will be coordinated by Burgess Estelle, Thoracic Navigator.  Time Total: 15 minutes  Visit consisted of  counseling and education dealing with complex health screening. Greater than 50%  of this time was spent counseling and coordinating care related to the above assessment and plan.  Signed by: Altha Harm, PhD, NP-C

## 2020-04-23 ENCOUNTER — Encounter: Payer: Self-pay | Admitting: *Deleted

## 2020-04-23 NOTE — Addendum Note (Signed)
Addended by: Altha Harm R on: 04/23/2020 10:10 AM   Modules accepted: Level of Service

## 2020-05-05 ENCOUNTER — Other Ambulatory Visit: Payer: Self-pay | Admitting: Nurse Practitioner

## 2020-05-05 NOTE — Telephone Encounter (Signed)
Requested Prescriptions  Pending Prescriptions Disp Refills  . diclofenac Sodium (VOLTAREN) 1 % GEL [Pharmacy Med Name: DICLOFENAC 1% GEL 100GM] 100 g 1    Sig: APPLY 2 GRAMS EXTERNALLY TO THE AFFECTED AREA FOUR TIMES DAILY     Analgesics:  Topicals Passed - 05/05/2020 12:06 PM      Passed - Valid encounter within last 12 months    Recent Outpatient Visits          1 month ago Routine general medical examination at a health care facility   [facility], Henrine Screws T, NP   7 months ago Mild episode of recurrent major depressive disorder ([place])   [place], Henrine Screws T, NP   1 year ago Chronic bronchitis, unspecified chronic bronchitis type ([place])   [place], Barbaraann Faster, NP   1 year ago Acute non-recurrent maxillary sinusitis   [place], [place] T, NP   1 year ago Current moderate episode of major depressive disorder without prior episode [facility])   [place], Lilia Argue, PA-C      Future Appointments            In 5 months [place], Barbaraann Faster, NP [employer]

## 2020-05-17 ENCOUNTER — Other Ambulatory Visit: Payer: Federal, State, Local not specified - PPO

## 2020-05-17 ENCOUNTER — Other Ambulatory Visit: Payer: Self-pay

## 2020-05-17 DIAGNOSIS — D72820 Lymphocytosis (symptomatic): Secondary | ICD-10-CM | POA: Diagnosis not present

## 2020-05-18 LAB — CBC WITH DIFFERENTIAL/PLATELET
Basophils Absolute: 0.1 10*3/uL (ref 0.0–0.2)
Basos: 1 %
EOS (ABSOLUTE): 0.2 10*3/uL (ref 0.0–0.4)
Eos: 2 %
Hematocrit: 44.6 % (ref 34.0–46.6)
Hemoglobin: 14.9 g/dL (ref 11.1–15.9)
Immature Grans (Abs): 0 10*3/uL (ref 0.0–0.1)
Immature Granulocytes: 0 %
Lymphocytes Absolute: 3.7 10*3/uL — ABNORMAL HIGH (ref 0.7–3.1)
Lymphs: 41 %
MCH: 32 pg (ref 26.6–33.0)
MCHC: 33.4 g/dL (ref 31.5–35.7)
MCV: 96 fL (ref 79–97)
Monocytes Absolute: 0.6 10*3/uL (ref 0.1–0.9)
Monocytes: 6 %
Neutrophils Absolute: 4.4 10*3/uL (ref 1.4–7.0)
Neutrophils: 50 %
Platelets: 280 10*3/uL (ref 150–450)
RBC: 4.66 x10E6/uL (ref 3.77–5.28)
RDW: 12.6 % (ref 11.7–15.4)
WBC: 8.9 10*3/uL (ref 3.4–10.8)

## 2020-05-18 LAB — VITAMIN B12: Vitamin B-12: 615 pg/mL (ref 232–1245)

## 2020-05-19 NOTE — Progress Notes (Signed)
Contacted via [place] evening Manus Gunning, your labs have returned.  White blood cell count has trended down, but lymphocytes remain mildly elevated which has been ongoing for 3-4 years on review.  We will recheck this at next visit.  B12 level has improved.  Any questions? Keep being awesome!!  Thank you for allowing me to participate in your care. Kindest regards, Dezarai Prew

## 2020-05-23 ENCOUNTER — Ambulatory Visit (INDEPENDENT_AMBULATORY_CARE_PROVIDER_SITE_OTHER): Payer: Federal, State, Local not specified - PPO | Admitting: Nurse Practitioner

## 2020-05-23 ENCOUNTER — Encounter: Payer: Self-pay | Admitting: Nurse Practitioner

## 2020-05-23 DIAGNOSIS — K760 Fatty (change of) liver, not elsewhere classified: Secondary | ICD-10-CM

## 2020-05-23 DIAGNOSIS — I700 Atherosclerosis of aorta: Secondary | ICD-10-CM | POA: Diagnosis not present

## 2020-05-23 DIAGNOSIS — R9389 Abnormal findings on diagnostic imaging of other specified body structures: Secondary | ICD-10-CM

## 2020-05-23 DIAGNOSIS — J432 Centrilobular emphysema: Secondary | ICD-10-CM

## 2020-05-23 DIAGNOSIS — Q999 Chromosomal abnormality, unspecified: Secondary | ICD-10-CM

## 2020-05-23 NOTE — Assessment & Plan Note (Signed)
Daughter recently found out she has a disorder with  MTHFR A1298C.  Patient does not wish to pursue genetic counseling at this time, but may in future.  Referral was offered.

## 2020-05-23 NOTE — Assessment & Plan Note (Signed)
Chronic, stable with FEV1 85% in March 2020.  Continue Symbicort daily and Albuterol as needed, adjust regimen as needed.  Initial lung screening performed and continue annually, as past smoker with 30 year smoking history.  Plan for repeat spirometry next visit.  Refills as needed.  Reviewed recent CT findings with her.

## 2020-05-23 NOTE — Patient Instructions (Signed)

## 2020-05-23 NOTE — Assessment & Plan Note (Addendum)
Noted on 04/19/20 lung screening CT, along with 3 vessel coronary artery disease.  Educated patient on this and recommend continue statin daily + add on ASA 81 MG daily for prevention.  Continue cessation of smoking.  Focus on healthy diet and regular exercise.

## 2020-05-23 NOTE — Assessment & Plan Note (Signed)
Noted on 04/19/20 lung screening CT.  Educated patient on this.  Focus on healthy diet and regular exercise. 

## 2020-05-23 NOTE — Assessment & Plan Note (Signed)
Following findings on 04/19/20 lung screening: "appears to be a porcelain gallbladder. Further evaluation with nonemergent abdominal CT with and without IV contrast is recommended in the near future to better evaluate this finding".  Discussed with patient and she wishes to pursue further imaging.  Abdominal CT w and w/o contrast ordered.

## 2020-05-23 NOTE — Progress Notes (Signed)
LMP  (LMP Unknown)    Subjective:    Patient ID: Dorothy Cunningham, female    DOB: Dec 23, 1960, 59 y.o.   MRN: [id number]  HPI: MONTEZ Cunningham is a 59 y.o. female  Chief Complaint  Patient presents with  . Results    discuss CT results  . other    pt wants to know if Dorothy Cunningham knows anything about this gene, MTHFR A1298C. States it is a kind of gene that may cause blood clots    . This visit was completed via telephone due to the restrictions of the COVID-19 pandemic. All issues as above were discussed and addressed but no physical exam was performed. If it was felt that the patient should be evaluated in the office, they were directed there. The patient verbally consented to this visit. Patient was unable to complete an audio/visual visit due to Lack of equipment. Due to the catastrophic nature of the COVID-19 pandemic, this visit was done through audio contact only. . Location of the patient: home . Location of the provider: work . Those involved with this call:  . Provider: Marnee Guarneri, DNP . CMA: Yvonna Alanis, CMA . Front Desk/Registration: Don Perking  . Time spent on call: 21 minutes on the phone discussing health concerns. 15 minutes total spent in review of patient's record and preparation of their chart.  . I verified patient identity using two factors (patient name and date of birth). Patient consents verbally to being seen via telemedicine visit today.     COPD Symbicort daily and Albuterol as needed. She quit smoking 4-5 years ago, smoked cigarettes for 30 years -- started at age 32 -- smoked about 1 PPD.    Had recent initial lung CT CA screening which noted aortic atherosclerosis, centrilobular and paraseptal emphysema + 3 vessel coronary artery disease.  There was also noted to be some hepatic steatosis and "appears to be a porcelain gallbladder. Further evaluation with nonemergent abdominal CT with and without IV contrast is recommended in the  near future to better evaluate this finding".   Her daughter just found out she has a MTHFR A1298C disorder and that it can increase risk for cardiac issues or clots.  Patient does not wish to pursue genetic counseling now, but may in the future. COPD status:stable Satisfied with current treatment?:yes Oxygen use:no Dyspnea frequency: none, only with heavy activity in the heat Cough frequency: none Rescue inhaler frequency:no more than once a week Limitation of activity:no Productive cough: none Last Spirometry:March 2020 Pneumovax:Not Up To Date Influenza:Up to Date  Relevant past medical, surgical, family and social history reviewed and updated as indicated. Interim medical history since our last visit reviewed. Allergies and medications reviewed and updated.  Review of Systems  Constitutional: Negative for activity change, appetite change, diaphoresis, fatigue and fever.  Respiratory: Negative for cough, chest tightness and shortness of breath.   Cardiovascular: Negative for chest pain, palpitations and leg swelling.  Gastrointestinal: Negative.   Neurological: Negative.   Psychiatric/Behavioral: Negative.     Per HPI unless specifically indicated above     Objective:    LMP  (LMP Unknown)   Wt Readings from Last 3 Encounters:  04/19/20 (!) 260 lb (117.9 kg)  04/03/20 258 lb 9.6 oz (117.3 kg)  12/02/18 244 lb 12.8 oz (111 kg)    Physical Exam   Unable to perform due to telephone visit only  Results for orders placed or performed in visit on 05/17/20  Vitamin B12  Result  Value Ref Range   Vitamin B-12 615 232 - 1,245 pg/mL  CBC with Differential/Platelet  Result Value Ref Range   WBC 8.9 3.4 - 10.8 x10E3/uL   RBC 4.66 3.77 - 5.28 x10E6/uL   Hemoglobin 14.9 11.1 - 15.9 g/dL   Hematocrit 44.6 34.0 - 46.6 %   MCV 96 79 - 97 fL   MCH 32.0 26.6 - 33.0 pg   MCHC 33.4 31 - 35 g/dL   RDW 12.6 11.7 - 15.4 %   Platelets 280 150 - 450 x10E3/uL   Neutrophils  50 Not Estab. %   Lymphs 41 Not Estab. %   Monocytes 6 Not Estab. %   Eos 2 Not Estab. %   Basos 1 Not Estab. %   Neutrophils Absolute 4.4 1 - 7 x10E3/uL   Lymphocytes Absolute 3.7 (H) 0 - 3 x10E3/uL   Monocytes Absolute 0.6 0 - 0 x10E3/uL   EOS (ABSOLUTE) 0.2 0.0 - 0.4 x10E3/uL   Basophils Absolute 0.1 0 - 0 x10E3/uL   Immature Granulocytes 0 Not Estab. %   Immature Grans (Abs) 0.0 0.0 - 0.1 x10E3/uL      Assessment & Plan:   Problem List Items Addressed This Visit      Cardiovascular and Mediastinum   Aortic atherosclerosis ([place])    Noted on 04/19/20 lung screening CT, along with 3 vessel coronary artery disease.  Educated patient on this and recommend continue statin daily + add on ASA 81 MG daily for prevention.  Continue cessation of smoking.  Focus on healthy diet and regular exercise.        Respiratory   Centrilobular emphysema ([place]) - Primary    Chronic, stable with FEV1 85% in March 2020.  Continue Symbicort daily and Albuterol as needed, adjust regimen as needed.  Initial lung screening performed and continue annually, as past smoker with 30 year smoking history.  Plan for repeat spirometry next visit.  Refills as needed.  Reviewed recent CT findings with her.        Digestive   Hepatic steatosis    Noted on 04/19/20 lung screening CT.  Educated patient on this.  Focus on healthy diet and regular exercise.        Other   Abnormal finding on imaging    Following findings on 04/19/20 lung screening: "appears to be a porcelain gallbladder. Further evaluation with nonemergent abdominal CT with and without IV contrast is recommended in the near future to better evaluate this finding".  Discussed with patient and she wishes to pursue further imaging.  Abdominal CT w and w/o contrast ordered.       Relevant Orders   CT ABDOMEN PELVIS W WO CONTRAST   Genetic disorder    Daughter recently found out she has a disorder with  MTHFR A1298C.  Patient does not wish to pursue  genetic counseling at this time, but may in future.  Referral was offered.         I discussed the assessment and treatment plan with the patient. The patient was provided an opportunity to ask questions and all were answered. The patient agreed with the plan and demonstrated an understanding of the instructions.   The patient was advised to call back or seek an in-person evaluation if the symptoms worsen or if the condition fails to improve as anticipated.   I provided 21+ minutes of time during this encounter.  Follow up plan: No follow-ups on file.

## 2020-06-06 ENCOUNTER — Ambulatory Visit: Admission: RE | Admit: 2020-06-06 | Payer: Federal, State, Local not specified - PPO | Source: Ambulatory Visit

## 2020-06-14 ENCOUNTER — Ambulatory Visit: Admission: RE | Admit: 2020-06-14 | Payer: Federal, State, Local not specified - PPO | Source: Ambulatory Visit

## 2020-07-02 ENCOUNTER — Other Ambulatory Visit: Payer: Self-pay | Admitting: Nurse Practitioner

## 2020-07-02 NOTE — Telephone Encounter (Signed)
Requested Prescriptions  Pending Prescriptions Disp Refills   diclofenac Sodium (VOLTAREN) 1 % GEL [Pharmacy Med Name: DICLOFENAC 1% GEL 100GM] 100 g 1    Sig: APPLY 2 GRAMS EXTERNALLY TO THE AFFECTED AREA FOUR TIMES DAILY     Analgesics:  Topicals Passed - 07/02/2020 10:57 AM      Passed - Valid encounter within last 12 months    Recent Outpatient Visits          1 month ago Centrilobular emphysema ([place])   [place] Cannady, Henrine Screws T, NP   3 months ago Routine general medical examination at a health care facility   [facility], [place] T, NP   9 months ago Mild episode of recurrent major depressive disorder ([place])   [place], Jolene T, NP   1 year ago Chronic bronchitis, unspecified chronic bronchitis type ([place])   [place], Henrine Screws T, NP   1 year ago Acute non-recurrent maxillary sinusitis   [place], Barbaraann Faster, NP      Future Appointments            In 3 months Cannady, Barbaraann Faster, NP [facility]            fluticasone (FLONASE) 50 MCG/ACT nasal spray [Pharmacy Med Name: FLUTICASONE 50MCG NASAL SP (120) RX] 16 g 6    Sig: SHAKE LIQUID AND USE 2 SPRAYS IN EACH NOSTRIL DAILY     Ear, Nose, and Throat: Nasal Preparations - Corticosteroids Passed - 07/02/2020 10:57 AM      Passed - Valid encounter within last 12 months    Recent Outpatient Visits          1 month ago Centrilobular emphysema ([place])   [place] Cannady, Jolene T, NP   3 months ago Routine general medical examination at a health care facility   [facility], [place] T, NP   9 months ago Mild episode of recurrent major depressive disorder ([place])   [place], Jolene T, NP   1 year ago Chronic bronchitis, unspecified chronic bronchitis type ([place])   [place], Jolene T, NP   1 year ago Acute non-recurrent maxillary  sinusitis   [place], Barbaraann Faster, NP      Future Appointments            In 3 months Cannady, Barbaraann Faster, NP [employer] [facility]

## 2020-07-26 ENCOUNTER — Ambulatory Visit: Payer: Federal, State, Local not specified - PPO

## 2020-08-12 ENCOUNTER — Telehealth: Payer: Self-pay

## 2020-08-12 NOTE — Telephone Encounter (Signed)
Pt would like disability placard to be filled, form left in bin to be reviewed.

## 2020-08-13 NOTE — Telephone Encounter (Signed)
Form placed in providers folder for completion and signature.

## 2020-08-13 NOTE — Telephone Encounter (Signed)
Form signed by Henrine Screws, copy placed in scan, and original up front for patient pick up. Called and LVM letting patient know that her form is ready to be picked up. Advised patient to make sure she completes and signs her portion before turning it in.

## 2020-09-05 ENCOUNTER — Other Ambulatory Visit: Payer: Self-pay | Admitting: Nurse Practitioner

## 2020-10-04 ENCOUNTER — Ambulatory Visit: Payer: Federal, State, Local not specified - PPO | Admitting: Nurse Practitioner

## 2020-11-01 ENCOUNTER — Other Ambulatory Visit: Payer: Self-pay

## 2020-11-01 ENCOUNTER — Encounter: Payer: Self-pay | Admitting: Nurse Practitioner

## 2020-11-01 ENCOUNTER — Ambulatory Visit: Payer: Federal, State, Local not specified - PPO | Admitting: Nurse Practitioner

## 2020-11-01 VITALS — BP 136/82 | HR 80 | Temp 98.5°F | Ht 60.32 in | Wt 258.2 lb

## 2020-11-01 DIAGNOSIS — I700 Atherosclerosis of aorta: Secondary | ICD-10-CM | POA: Diagnosis not present

## 2020-11-01 DIAGNOSIS — Z87891 Personal history of nicotine dependence: Secondary | ICD-10-CM

## 2020-11-01 DIAGNOSIS — E782 Mixed hyperlipidemia: Secondary | ICD-10-CM

## 2020-11-01 DIAGNOSIS — Z6841 Body Mass Index (BMI) 40.0 and over, adult: Secondary | ICD-10-CM

## 2020-11-01 DIAGNOSIS — J432 Centrilobular emphysema: Secondary | ICD-10-CM

## 2020-11-01 DIAGNOSIS — F321 Major depressive disorder, single episode, moderate: Secondary | ICD-10-CM | POA: Diagnosis not present

## 2020-11-01 DIAGNOSIS — Q999 Chromosomal abnormality, unspecified: Secondary | ICD-10-CM

## 2020-11-01 DIAGNOSIS — R9389 Abnormal findings on diagnostic imaging of other specified body structures: Secondary | ICD-10-CM

## 2020-11-01 NOTE — Assessment & Plan Note (Signed)
Noted on 04/19/20 lung screening CT, along with 3 vessel coronary artery disease.  Educated patient on this and recommend continue statin daily + add on ASA 81 MG daily for prevention.  Continue cessation of smoking.  Focus on healthy diet and regular exercise. 

## 2020-11-01 NOTE — Assessment & Plan Note (Signed)
BMI 49.90 with COPD and HLD.  Recommended eating smaller high protein, low fat meals more frequently and exercising 30 mins a day 5 times a week with a goal of 10-15lb weight loss in the next 3 months. Patient voiced their understanding and motivation to adhere to these recommendations.

## 2020-11-01 NOTE — Assessment & Plan Note (Signed)
Chronic, stable.  Continue current medication regimen and adjust as needed.  Lipid panel today. 

## 2020-11-01 NOTE — Assessment & Plan Note (Signed)
Chronic, stable with FEV1 88% today, remaining stable from 2020 at 85%, and FEV1/FVC 112%.  Continue Symbicort daily and Albuterol as needed, adjust regimen as needed.  Initial lung screening performed and continue annually, as past smoker with 30 year smoking history.  Plan for repeat spirometry in 2023.  Refills as needed.

## 2020-11-01 NOTE — Assessment & Plan Note (Signed)
Following findings on 04/19/20 lung screening: "appears to be a porcelain gallbladder. Further evaluation with nonemergent abdominal CT with and without IV contrast is recommended in the near future to better evaluate this finding".  Has not attended abdominal CT as of yet, recommend she does in future to further assess.

## 2020-11-01 NOTE — Assessment & Plan Note (Signed)
Daughter recently found out she has a disorder with  MTHFR A1298C.  Patient does not wish to pursue genetic counseling at this time, but may in future.  Referral was offered. 

## 2020-11-01 NOTE — Assessment & Plan Note (Signed)
Continue annual lung CT screening and monitor. 

## 2020-11-01 NOTE — Progress Notes (Signed)
BP 136/82   Pulse 80   Temp 98.5 F (36.9 C) (Oral)   Ht 5' 0.32" (1.532 m)   Wt 258 lb 3.2 oz (117.1 kg)   LMP  (LMP Unknown)   SpO2 97%   BMI 49.90 kg/m    Subjective:    Patient ID: Dorothy Cunningham, female    DOB: 1961-04-03, 60 y.o.   MRN: [id number]  HPI: Dorothy Cunningham is a 60 y.o. female  Chief Complaint  Patient presents with  . COPD  . Depression  . Hyperlipidemia   COPD Continues Symbicort daily and Albuterol as needed. She quit smoking 6 years ago, smoked cigarettes for 30 years -- started at age 71 -- smoked about 1 PPD.    Initial lung CT CA screening which noted aortic atherosclerosis, centrilobular and paraseptal emphysema + 3 vessel coronary artery disease.  There was also noted to be some hepatic steatosis and "appears to be a porcelain gallbladder. Further evaluation with nonemergent abdominal CT with and without IV contrast is recommended in the near future to better evaluate this finding".  A CT abdomen was ordered, has not obtained yet.  Her daughter just found out she has a MTHFR A1298C disorder and that it can increase risk for cardiac issues or clots.  Patient does not wish to pursue genetic counseling now, but may in the future. COPD status:stable Satisfied with current treatment?:yes Oxygen use:no Dyspnea frequency: none, only with heavy activity in the heat Cough frequency: none Rescue inhaler frequency:no more than once a week Limitation of activity:no Productive cough: none Last Spirometry:tpday Pneumovax:Not Up To Date Influenza:Up to Date  HYPERLIPIDEMIA Continues on Atorvastatin 20 MG. Hyperlipidemia status:good compliance Satisfied with current treatment?yes Side effects:no Medication compliance:good compliance Past cholesterol meds:Atorvastatin Supplements:none Aspirin:no The 10-year ASCVD risk score Mikey Bussing DC Jr., et al., 2013) is: 4.5%   Values used to calculate the score:     Age: 33 years      Sex: Female     Is Non-Hispanic African American: No     Diabetic: No     Tobacco smoker: No     Systolic Blood Pressure: 540 mmHg     Is BP treated: No     HDL Cholesterol: 37 mg/dL     Total Cholesterol: 138 mg/dL Chest pain:no Coronary artery disease:no Family history CAD:no Family history early CAD:no  DEPRESSION Continues on Wellbutrin 300 MG daily.  Mood status:stable Satisfied with current treatment?:yes Symptom severity:mild Duration of current treatment :chronic Side effects:no Medication compliance:good compliance Psychotherapy/counseling:none Previous psychiatric medications:Wellbutrin Depressed mood:no Anxious mood:no Anhedonia:no Significant weight loss or gain:no Insomnia:none Fatigue:somewhat Feelings of worthlessness or guilt:no Impaired concentration/indecisiveness:no Suicidal ideations:no Hopelessness:no Crying spells:no Depression screen [facility] 2/9 11/01/2020 04/03/2020 10/06/2019 08/24/2018 05/20/2018  Decreased Interest 0 0 0 0 1  Down, Depressed, Hopeless 0 0 0 0 1  PHQ - 2 Score 0 0 0 0 2  Altered sleeping 0 0 0 0 1  Tired, decreased energy 0 1 3 0 3  Change in appetite 0 0 1 0 2  Feeling bad or failure about yourself  0 0 0 0 2  Trouble concentrating 0 0 0 0 1  Moving slowly or fidgety/restless 0 0 0 0 0  Suicidal thoughts 0 0 0 0 0  PHQ-9 Score 0 1 4 0 11  Difficult doing work/chores - Not difficult at all Not difficult at all Not difficult at all -    Relevant past medical, surgical, family and social history reviewed  and updated as indicated. Interim medical history since our last visit reviewed. Allergies and medications reviewed and updated.  Review of Systems  Constitutional: Negative for activity change, appetite change, diaphoresis, fatigue and fever.  Respiratory: Negative for cough, chest tightness and shortness of breath.   Cardiovascular: Negative for chest pain, palpitations and leg swelling.   Gastrointestinal: Negative.   Neurological: Negative.   Psychiatric/Behavioral: Negative.     Per HPI unless specifically indicated above     Objective:    BP 136/82   Pulse 80   Temp 98.5 F (36.9 C) (Oral)   Ht 5' 0.32" (1.532 m)   Wt 258 lb 3.2 oz (117.1 kg)   LMP  (LMP Unknown)   SpO2 97%   BMI 49.90 kg/m   Wt Readings from Last 3 Encounters:  11/01/20 258 lb 3.2 oz (117.1 kg)  04/19/20 (!) 260 lb (117.9 kg)  04/03/20 258 lb 9.6 oz (117.3 kg)    Physical Exam Vitals and nursing note reviewed.  Constitutional:      General: She is awake. She is not in acute distress.    Appearance: She is well-developed and well-groomed. She is morbidly obese. She is not ill-appearing.  HENT:     Head: Normocephalic.     Right Ear: Hearing normal.     Left Ear: Hearing normal.  Eyes:     General: Lids are normal.        Right eye: No discharge.        Left eye: No discharge.     Conjunctiva/sclera: Conjunctivae normal.     Pupils: Pupils are equal, round, and reactive to light.  Neck:     Thyroid: No thyromegaly.     Vascular: No carotid bruit.  Cardiovascular:     Rate and Rhythm: Normal rate and regular rhythm.     Heart sounds: Normal heart sounds. No murmur heard. No gallop.   Pulmonary:     Effort: Pulmonary effort is normal. No accessory muscle usage or respiratory distress.     Breath sounds: Normal breath sounds.  Abdominal:     General: Bowel sounds are normal.     Palpations: Abdomen is soft.  Musculoskeletal:     Cervical back: Normal range of motion and neck supple.     Right lower leg: No edema.     Left lower leg: No edema.  Skin:    General: Skin is warm and dry.  Neurological:     Mental Status: She is alert and oriented to person, place, and time.  Psychiatric:        Attention and Perception: Attention normal.        Mood and Affect: Mood normal.        Speech: Speech normal.        Behavior: Behavior normal. Behavior is cooperative.         Thought Content: Thought content normal.      Results for orders placed or performed in visit on 05/17/20  Vitamin B12  Result Value Ref Range   Vitamin B-12 615 232 - 1,245 pg/mL  CBC with Differential/Platelet  Result Value Ref Range   WBC 8.9 3.4 - 10.8 x10E3/uL   RBC 4.66 3.77 - 5.28 x10E6/uL   Hemoglobin 14.9 11.1 - 15.9 g/dL   Hematocrit 44.6 34.0 - 46.6 %   MCV 96 79 - 97 fL   MCH 32.0 26.6 - 33.0 pg   MCHC 33.4 31.5 - 35.7 g/dL   RDW 12.6 11.7 -  15.4 %   Platelets 280 150 - 450 x10E3/uL   Neutrophils 50 Not Estab. %   Lymphs 41 Not Estab. %   Monocytes 6 Not Estab. %   Eos 2 Not Estab. %   Basos 1 Not Estab. %   Neutrophils Absolute 4.4 1.4 - 7.0 x10E3/uL   Lymphocytes Absolute 3.7 (H) 0.7 - 3.1 x10E3/uL   Monocytes Absolute 0.6 0.1 - 0.9 x10E3/uL   EOS (ABSOLUTE) 0.2 0.0 - 0.4 x10E3/uL   Basophils Absolute 0.1 0.0 - 0.2 x10E3/uL   Immature Granulocytes 0 Not Estab. %   Immature Grans (Abs) 0.0 0.0 - 0.1 x10E3/uL      Assessment & Plan:   Problem List Items Addressed This Visit      Cardiovascular and Mediastinum   Aortic atherosclerosis ([place])    Noted on 04/19/20 lung screening CT, along with 3 vessel coronary artery disease.  Educated patient on this and recommend continue statin daily + add on ASA 81 MG daily for prevention.  Continue cessation of smoking.  Focus on healthy diet and regular exercise.      Relevant Medications   aspirin (ASPIRIN LOW DOSE) 81 MG EC tablet     Respiratory   Centrilobular emphysema ([facility]) - Primary    Chronic, stable with FEV1 88% today, remaining stable from 2020 at 85%, and FEV1/FVC 112%.  Continue Symbicort daily and Albuterol as needed, adjust regimen as needed.  Initial lung screening performed and continue annually, as past smoker with 30 year smoking history.  Plan for repeat spirometry in 2023.  Refills as needed.        Relevant Orders   Spirometry with graph (Completed)   CBC with Differential/Platelet   Basic  metabolic panel     Other   Depression    Chronic, stable on Wellbutrin.  Continue current regimen and adjust as needed.  Denies SI/HI.  Refills as needed.      Hyperlipidemia    Chronic, stable.  Continue current medication regimen and adjust as needed.  Lipid panel today.      Relevant Medications   aspirin (ASPIRIN LOW DOSE) 81 MG EC tablet   Other Relevant Orders   Lipid Panel w/o Chol/HDL Ratio   Morbid obesity ([facility])    BMI 49.90 with COPD and HLD.  Recommended eating smaller high protein, low fat meals more frequently and exercising 30 mins a day 5 times a week with a goal of 10-15lb weight loss in the next 3 months. Patient voiced their understanding and motivation to adhere to these recommendations.       BMI 50.0-59.9, adult ([facility])    Recommended eating smaller high protein, low fat meals more frequently and exercising 30 mins a day 5 times a week with a goal of 10-15lb weight loss in the next 3 months. Patient voiced their understanding and motivation to adhere to these recommendations.       History of smoking    Continue annual lung CT screening and monitor.      Abnormal finding on imaging    Following findings on 04/19/20 lung screening: "appears to be a porcelain gallbladder. Further evaluation with nonemergent abdominal CT with and without IV contrast is recommended in the near future to better evaluate this finding".  Has not attended abdominal CT as of yet, recommend she does in future to further assess.       Genetic disorder    Daughter recently found out she has a disorder with  MTHFR A1298C.  Patient does not wish to pursue genetic counseling at this time, but may in future.  Referral was offered.          Follow up plan: Return in about 5 months (around 04/07/2021) for Annual physical.

## 2020-11-01 NOTE — Patient Instructions (Signed)

## 2020-11-01 NOTE — Assessment & Plan Note (Signed)
Recommended eating smaller high protein, low fat meals more frequently and exercising 30 mins a day 5 times a week with a goal of 10-15lb weight loss in the next 3 months. Patient voiced their understanding and motivation to adhere to these recommendations.  

## 2020-11-01 NOTE — Assessment & Plan Note (Signed)
Chronic, stable on Wellbutrin.  Continue current regimen and adjust as needed.  Denies SI/HI.  Refills as needed. 

## 2020-11-02 LAB — CBC WITH DIFFERENTIAL/PLATELET
Basophils Absolute: 0.1 10*3/uL (ref 0.0–0.2)
Basos: 1 %
EOS (ABSOLUTE): 0.2 10*3/uL (ref 0.0–0.4)
Eos: 2 %
Hematocrit: 43 % (ref 34.0–46.6)
Hemoglobin: 14.8 g/dL (ref 11.1–15.9)
Immature Grans (Abs): 0.1 10*3/uL (ref 0.0–0.1)
Immature Granulocytes: 1 %
Lymphocytes Absolute: 3.2 10*3/uL — ABNORMAL HIGH (ref 0.7–3.1)
Lymphs: 29 %
MCH: 31.8 pg (ref 26.6–33.0)
MCHC: 34.4 g/dL (ref 31.5–35.7)
MCV: 93 fL (ref 79–97)
Monocytes Absolute: 0.6 10*3/uL (ref 0.1–0.9)
Monocytes: 6 %
Neutrophils Absolute: 6.8 10*3/uL (ref 1.4–7.0)
Neutrophils: 61 %
Platelets: 307 10*3/uL (ref 150–450)
RBC: 4.65 x10E6/uL (ref 3.77–5.28)
RDW: 12.6 % (ref 11.7–15.4)
WBC: 11 10*3/uL — ABNORMAL HIGH (ref 3.4–10.8)

## 2020-11-02 LAB — BASIC METABOLIC PANEL
BUN/Creatinine Ratio: 13 (ref 12–28)
BUN: 16 mg/dL (ref 8–27)
CO2: 20 mmol/L (ref 20–29)
Calcium: 9.5 mg/dL (ref 8.7–10.3)
Chloride: 106 mmol/L (ref 96–106)
Creatinine, Ser: 1.25 mg/dL — ABNORMAL HIGH (ref 0.57–1.00)
GFR calc Af Amer: 53 mL/min/{1.73_m2} — ABNORMAL LOW (ref >59)
GFR calc non Af Amer: 46 mL/min/{1.73_m2} — ABNORMAL LOW (ref >59)
Glucose: 92 mg/dL (ref 65–99)
Potassium: 4 mmol/L (ref 3.5–5.2)
Sodium: 141 mmol/L (ref 134–144)

## 2020-11-02 LAB — LIPID PANEL W/O CHOL/HDL RATIO
Cholesterol, Total: 167 mg/dL (ref 100–199)
HDL: 37 mg/dL — ABNORMAL LOW (ref >39)
LDL Chol Calc (NIH): 78 mg/dL (ref 0–99)
Triglycerides: 324 mg/dL — ABNORMAL HIGH (ref 0–149)
VLDL Cholesterol Cal: 52 mg/dL — ABNORMAL HIGH (ref 5–40)

## 2020-11-03 NOTE — Progress Notes (Signed)
Contacted via [place] evening Manus Gunning, your labs have returned.  White blood cell count and lymphocytes are mildly elevated this check again, will recheck next visit -- please ensure to schedule your physical for around 04/04/21, so we can recheck.  Kidney function continues to show some mild kidney disease, but no decline in this.  We will continue to monitor closely.  Cholesterol level show stable LDL, bad cholesterol, but elevations ongoing in triglycerides.  You can take some fish oil 1g daily and recheck next visit. Also should reduce saturated fats and sugars in diet.  Any questions? Keep being awesome!!  Thank you for allowing me to participate in your care. Kindest regards, Jeffie Spivack

## 2020-11-09 ENCOUNTER — Other Ambulatory Visit: Payer: Self-pay | Admitting: Nurse Practitioner

## 2020-12-26 ENCOUNTER — Other Ambulatory Visit: Payer: Self-pay | Admitting: Nurse Practitioner

## 2020-12-26 NOTE — Telephone Encounter (Signed)
Future appt in 3 months

## 2021-01-17 ENCOUNTER — Other Ambulatory Visit: Payer: Self-pay | Admitting: Nurse Practitioner

## 2021-01-17 NOTE — Telephone Encounter (Signed)
Requested medication (s) are due for refill today: yes  Requested medication (s) are on the active medication list: yes  Last refill:  11/09/20  Future visit scheduled: yes  Notes to clinic:  no assigned protocol    Requested Prescriptions  Pending Prescriptions Disp Refills   nystatin ointment (MYCOSTATIN) [Pharmacy Med Name: NYSTATIN OINTMENT 30GM] 60 g 3    Sig: APPLY TOPICALLY TO [place]      Off-Protocol Failed - 01/17/2021  7:01 AM      Failed - Medication not assigned to a protocol, review manually.      Passed - Valid encounter within last 12 months    Recent Outpatient Visits           2 months ago Centrilobular emphysema ([place])   [place] Cannady, Henrine Screws T, NP   7 months ago Centrilobular emphysema ([place])   [place] Cannady, Barbaraann Faster, NP   9 months ago Routine general medical examination at a health care facility   [place], [place] T, NP   1 year ago Mild episode of recurrent major depressive disorder ([place])   [place], Jolene T, NP   2 years ago Chronic bronchitis, unspecified chronic bronchitis type ([place])   [place], Barbaraann Faster, NP       Future Appointments             In 2 months Cannady, Barbaraann Faster, NP [facility]

## 2021-01-17 NOTE — Telephone Encounter (Signed)
Scheduled 7/8

## 2021-02-21 ENCOUNTER — Other Ambulatory Visit: Payer: Self-pay | Admitting: Nurse Practitioner

## 2021-03-10 ENCOUNTER — Other Ambulatory Visit: Payer: Self-pay | Admitting: Nurse Practitioner

## 2021-03-17 ENCOUNTER — Other Ambulatory Visit: Payer: Self-pay | Admitting: Nurse Practitioner

## 2021-03-17 NOTE — Telephone Encounter (Signed)
Scheduled 7/8

## 2021-03-17 NOTE — Telephone Encounter (Signed)
  Notes to clinic: review for refill  Just filled on 02/21/2021 for 48g    Requested Prescriptions  Pending Prescriptions Disp Refills   fluticasone (FLONASE) 50 MCG/ACT nasal spray [Pharmacy Med Name: FLUTICASONE 50MCG NASAL SP (120) RX] 48 g 0    Sig: SHAKE LIQUID AND USE 2 SPRAYS IN EACH NOSTRIL DAILY      Ear, Nose, and Throat: Nasal Preparations - Corticosteroids Passed - 03/17/2021 10:32 AM      Passed - Valid encounter within last 12 months    Recent Outpatient Visits           4 months ago Centrilobular emphysema ([place])   [place] Cannady, Jolene T, NP   9 months ago Centrilobular emphysema ([place])   [place] Cannady, Jolene T, NP   11 months ago Routine general medical examination at a health care facility   [place], [place] T, NP   1 year ago Mild episode of recurrent major depressive disorder ([place])   [place], Jolene T, NP   2 years ago Chronic bronchitis, unspecified chronic bronchitis type ([place])   [place], Barbaraann Faster, NP       Future Appointments             In 2 weeks Cannady, Barbaraann Faster, NP [facility]

## 2021-03-23 ENCOUNTER — Other Ambulatory Visit: Payer: Self-pay | Admitting: Nurse Practitioner

## 2021-03-23 NOTE — Telephone Encounter (Signed)
last RF 03/10/21 100 g

## 2021-04-04 ENCOUNTER — Ambulatory Visit: Payer: Federal, State, Local not specified - PPO | Admitting: Nurse Practitioner

## 2021-04-17 ENCOUNTER — Telehealth (INDEPENDENT_AMBULATORY_CARE_PROVIDER_SITE_OTHER): Payer: Federal, State, Local not specified - PPO | Admitting: Internal Medicine

## 2021-04-17 ENCOUNTER — Other Ambulatory Visit: Payer: Self-pay

## 2021-04-17 ENCOUNTER — Encounter: Payer: Self-pay | Admitting: Internal Medicine

## 2021-04-17 VITALS — Temp 98.1°F

## 2021-04-17 DIAGNOSIS — U071 COVID-19: Secondary | ICD-10-CM | POA: Diagnosis not present

## 2021-04-17 MED ORDER — AZITHROMYCIN 250 MG PO TABS: ORAL_TABLET | ORAL | 0 refills | Status: DC

## 2021-04-17 MED ORDER — MOLNUPIRAVIR EUA 200MG CAPSULE
4.0000 | ORAL_CAPSULE | Freq: Two times a day (BID) | ORAL | 0 refills | Status: AC
  Filled 2021-04-17: qty 40, 5d supply, fill #0

## 2021-04-17 MED ORDER — FEXOFENADINE HCL 180 MG PO TABS: 180.0000 mg | ORAL_TABLET | Freq: Every day | ORAL | 1 refills | Status: DC

## 2021-04-17 NOTE — Progress Notes (Signed)
Temp 98.1 F (36.7 C) (Oral)   LMP  (LMP Unknown)    Subjective:    Patient ID: Dorothy Cunningham, female    DOB: [birth year], 60 y.o.   MRN: [id number]  Chief Complaint  Patient presents with   Covid Positive    Tested positive today, symptoms: achy, headache, sore throat, scratchy dry throat    HPI: Dorothy Cunningham is a 60 y.o. female   This visit was completed via telephone due to the restrictions of the COVID-19 pandemic. All issues as above were discussed and addressed but no physical exam was performed. If it was felt that the patient should be evaluated in the office, they were directed there. The patient verbally consented to this visit. Patient was unable to complete an audio/visual visit due to Technical difficulties. Due to the catastrophic nature of the COVID-19 pandemic, this visit was done through audio contact only. Location of the patient: home Location of the provider: work Those involved with this call:  Provider: Charlynne Cousins, MD CMA: Frazier Butt, [place] Desk/Registration: Jill Side  Time spent on call: 10 minutes on the phone discussing health concerns. 10 minutes total spent in review of patient's record and preparation of their chart.  Patient presents with: Covid Positive: Tested positive today, symptoms: achy, headache, sore throat, scratchy dry throat 213/79 mm hg at home and 235/ 79 mm hg : a little while ago. Bp now is 141/110 mm hg.     Cough Associated symptoms include headaches.  Headache  Associated symptoms include coughing.   Chief Complaint  Patient presents with   Covid Positive    Tested positive today, symptoms: achy, headache, sore throat, scratchy dry throat    Relevant past medical, surgical, family and social history reviewed and updated as indicated. Interim medical history since our last visit reviewed. Allergies and medications reviewed and updated.  Review of Systems  Respiratory:  Positive for cough.    Neurological:  Positive for headaches.   Per HPI unless specifically indicated above     Objective:    Temp 98.1 F (36.7 C) (Oral)   LMP  (LMP Unknown)   Wt Readings from Last 3 Encounters:  11/01/20 258 lb 3.2 oz (117.1 kg)  04/19/20 (!) 260 lb (117.9 kg)  04/03/20 258 lb 9.6 oz (117.3 kg)    Physical Exam  Unable to peform sec to virtual visit.   Results for orders placed or performed in visit on 11/01/20  CBC with Differential/Platelet  Result Value Ref Range   WBC 11.0 (H) 3.4 - 10.8 x10E3/uL   RBC 4.65 3.77 - 5.28 x10E6/uL   Hemoglobin 14.8 11.1 - 15.9 g/dL   Hematocrit 43.0 34.0 - 46.6 %   MCV 93 79 - 97 fL   MCH 31.8 26.6 - 33.0 pg   MCHC 34.4 31.5 - 35.7 g/dL   RDW 12.6 11.7 - 15.4 %   Platelets 307 150 - 450 x10E3/uL   Neutrophils 61 Not Estab. %   Lymphs 29 Not Estab. %   Monocytes 6 Not Estab. %   Eos 2 Not Estab. %   Basos 1 Not Estab. %   Neutrophils Absolute 6.8 1.4 - 7.0 x10E3/uL   Lymphocytes Absolute 3.2 (H) 0.7 - 3.1 x10E3/uL   Monocytes Absolute 0.6 0.1 - 0.9 x10E3/uL   EOS (ABSOLUTE) 0.2 0.0 - 0.4 x10E3/uL   Basophils Absolute 0.1 0.0 - 0.2 x10E3/uL   Immature Granulocytes 1 Not Estab. %   Immature Grans (Abs)  0.1 0.0 - 0.1 M38G6/KZ  Basic metabolic panel  Result Value Ref Range   Glucose 92 65 - 99 mg/dL   BUN 16 8 - 27 mg/dL   Creatinine, Ser 1.25 (H) 0.57 - 1.00 mg/dL   GFR calc non Af Amer 46 (L) >59 mL/min/1.73   GFR calc Af Amer 53 (L) >59 mL/min/1.73   BUN/Creatinine Ratio 13 12 - 28   Sodium 141 134 - 144 mmol/L   Potassium 4.0 3.5 - 5.2 mmol/L   Chloride 106 96 - 106 mmol/L   CO2 20 20 - 29 mmol/L   Calcium 9.5 8.7 - 10.3 mg/dL  Lipid Panel w/o Chol/HDL Ratio  Result Value Ref Range   Cholesterol, Total 167 100 - 199 mg/dL   Triglycerides 324 (H) 0 - 149 mg/dL   HDL 37 (L) >39 mg/dL   VLDL Cholesterol Cal 52 (H) 5 - 40 mg/dL   LDL Chol Calc (NIH) 78 0 - 99 mg/dL        Current Outpatient Medications:    nystatin  ointment (MYCOSTATIN), APPLY TOPICALLY TO THE AFFECTED AREA THREE TIMES DAILY, Disp: 60 g, Rfl: 3   albuterol (VENTOLIN HFA) 108 (90 Base) MCG/ACT inhaler, INHALE 2 PUFFS INTO THE LUNGS EVERY 6 HOURS AS NEEDED FOR WHEEZING OR SHORTNESS OF BREATH, Disp: 6.7 g, Rfl: 3   aspirin (ASPIRIN LOW DOSE) 81 MG EC tablet, , Disp: , Rfl:    atorvastatin (LIPITOR) 20 MG tablet, TAKE 1 TABLET(20 MG) BY MOUTH DAILY, Disp: 90 tablet, Rfl: 1   budesonide-formoterol (SYMBICORT) 160-4.5 MCG/ACT inhaler, Inhale 2 puffs into the lungs 2 (two) times daily., Disp: 30.6 g, Rfl: 11   buPROPion (WELLBUTRIN XL) 300 MG 24 hr tablet, TAKE 1 TABLET(300 MG) BY MOUTH DAILY, Disp: 90 tablet, Rfl: 1   Chlorpheniramine Maleate (ALLERGY RELIEF PO), Take 1 tablet by mouth as needed., Disp: , Rfl:    cyanocobalamin 1000 MCG tablet, Take 1,000 mcg by mouth daily., Disp: , Rfl:    diclofenac Sodium (VOLTAREN) 1 % GEL, APPLY 2 GRAMS TOPICALLY TO THE AFFECTED AREA FOUR TIMES DAILY, Disp: 100 g, Rfl: 0   fluticasone (FLONASE) 50 MCG/ACT nasal spray, SHAKE LIQUID AND USE 2 SPRAYS IN EACH NOSTRIL DAILY, Disp: 48 g, Rfl: 6   Garlic 10 MG CAPS, , Disp: , Rfl:    Glucosamine-Chondroit-Vit C-Mn (GLUCOSAMINE CHONDR 500 COMPLEX) CAPS, Take 2 capsules by mouth daily., Disp: , Rfl:    Multiple Vitamin (MULTIVITAMIN) tablet, Take 1 tablet by mouth daily., Disp: , Rfl:    VITAMIN D PO, Take by mouth., Disp: , Rfl:     Assessment & Plan:  COVID : positive : Increase fluid intake. Headache - tyelnol every 4-6 hrs prn and alternate this with ibubrufen 800 mg q 8 hrly.Sinus pressure: use steam inhalation. OTC -  Allegra / claritin. 5 days quarantine.   Ok to rtw in 5 days if tests -ve follow. Pt verbalized understanding of such, to get to the office at today and get a curb side test for the above.    No orders of the defined types were placed in this encounter.    No orders of the defined types were placed in this encounter.    Follow up  plan: No follow-ups on file.

## 2021-04-19 ENCOUNTER — Other Ambulatory Visit: Payer: Self-pay | Admitting: Nurse Practitioner

## 2021-04-19 NOTE — Telephone Encounter (Signed)
Requested Prescriptions  Pending Prescriptions Disp Refills  . albuterol (VENTOLIN HFA) 108 (90 Base) MCG/ACT inhaler [Pharmacy Med Name: ALBUTEROL HFA INH (200 PUFFS) 6.7GM] 6.7 g 1    Sig: INHALE 2 PUFFS INTO THE LUNGS EVERY 6 HOURS AS NEEDED FOR WHEEZING OR SHORTNESS OF BREATH     Pulmonology:  Beta Agonists Failed - 04/19/2021 10:49 AM      Failed - One inhaler should last at least one month. If the patient is requesting refills earlier, contact the patient to check for uncontrolled symptoms.      Passed - Valid encounter within last 12 months    Recent Outpatient Visits          2 days ago    [place], MD   5 months ago Centrilobular emphysema ([place])   [place], Henrine Screws T, NP   11 months ago Centrilobular emphysema ([place])   [place], Henrine Screws T, NP   1 year ago Routine general medical examination at a health care facility   [place], [place] T, NP   1 year ago Mild episode of recurrent major depressive disorder ([place])   [place], Barbaraann Faster, NP      Future Appointments            In 2 weeks Cannady, Barbaraann Faster, NP [facility]           . budesonide-formoterol (SYMBICORT) 160-4.5 MCG/ACT inhaler [Pharmacy Med Name: BUDESONIDE/FORM 160/4.5MCG(120 INH)] 30.6 g 1    Sig: INHALE 2 PUFFS INTO THE LUNGS TWICE DAILY     Pulmonology:  Combination Products Passed - 04/19/2021 10:49 AM      Passed - Valid encounter within last 12 months    Recent Outpatient Visits          2 days ago    [place], MD   5 months ago Centrilobular emphysema ([place])   [place], Henrine Screws T, NP   11 months ago Centrilobular emphysema ([place])   [place], Henrine Screws T, NP   1 year ago Routine general medical examination at a health care facility   [facility], [place] T, NP   1 year ago  Mild episode of recurrent major depressive disorder ([place])   [place], Barbaraann Faster, NP      Future Appointments            In 2 weeks Cannady, Barbaraann Faster, NP [facility]

## 2021-04-19 NOTE — Telephone Encounter (Signed)
Must attend upcoming appt for further refills

## 2021-05-02 ENCOUNTER — Other Ambulatory Visit: Payer: Self-pay

## 2021-05-02 MED ORDER — DICLOFENAC SODIUM 1 % EX GEL: 2.0000 g | Freq: Four times a day (QID) | CUTANEOUS | 0 refills | Status: DC

## 2021-05-06 ENCOUNTER — Ambulatory Visit: Payer: Federal, State, Local not specified - PPO | Admitting: Nurse Practitioner

## 2021-05-19 ENCOUNTER — Other Ambulatory Visit: Payer: Self-pay | Admitting: Nurse Practitioner

## 2021-05-19 NOTE — Telephone Encounter (Signed)
   Notes to clinic:  Review for refill  Patient canceled appt that was scheduled for 05/06/2021   Requested Prescriptions  Pending Prescriptions Disp Refills   diclofenac Sodium (VOLTAREN) 1 % GEL [Pharmacy Med Name: DICLOFENAC 1% GEL 100GM] 100 g 0    Sig: APPLY 2 GRAMS TOPICALLY TO THE AFFECTED AREA FOUR TIMES DAILY     Analgesics:  Topicals Passed - 05/19/2021  6:48 AM      Passed - Valid encounter within last 12 months    Recent Outpatient Visits           1 month ago [place], MD   6 months ago Centrilobular emphysema ([place])   [place], Amelia T, NP   12 months ago Centrilobular emphysema ([place])   [place], Henrine Screws T, NP   1 year ago Routine general medical examination at a health care facility   [facility], [place] T, NP   1 year ago Mild episode of recurrent major depressive disorder [facility])   [place], Barbaraann Faster, NP

## 2021-05-19 NOTE — Telephone Encounter (Signed)
Patient last seen 11/01/20, over due for follow up. Please call to schedule.

## 2021-05-20 NOTE — Telephone Encounter (Signed)
Left message for patient to call and schedule appointment.

## 2021-06-07 ENCOUNTER — Other Ambulatory Visit: Payer: Self-pay | Admitting: Nurse Practitioner

## 2021-06-07 NOTE — Telephone Encounter (Signed)
Requested Prescriptions  Pending Prescriptions Disp Refills  . diclofenac Sodium (VOLTAREN) 1 % GEL [Pharmacy Med Name: DICLOFENAC 1% GEL 100GM] 100 g     Sig: APPLY 2 GRAMS TOPICALLY TO THE AFFECTED AREA FOUR TIMES DAILY     Analgesics:  Topicals Passed - 06/07/2021 10:42 AM      Passed - Valid encounter within last 12 months    Recent Outpatient Visits          1 month ago [place] Vigg, Avanti, MD   7 months ago Centrilobular emphysema ([place])   [place], [place] T, NP   1 year ago Centrilobular emphysema ([place])   [place], Henrine Screws T, NP   1 year ago Routine general medical examination at a health care facility   [facility] [place] T, NP   1 year ago Mild episode of recurrent major depressive disorder ([place])   [place], Jolene T, NP             . buPROPion (WELLBUTRIN XL) 300 MG 24 hr tablet [Pharmacy Med Name: BUPROPION XL '300MG'$  TABLETS] 90 tablet 1    Sig: TAKE 1 TABLET(300 MG) BY MOUTH DAILY     Psychiatry: Antidepressants - bupropion Passed - 06/07/2021 10:42 AM      Passed - Completed PHQ-2 or PHQ-9 in the last 360 days      Passed - Last BP in normal range    BP Readings from Last 1 Encounters:  11/01/20 136/82         Passed - Valid encounter within last 6 months    Recent Outpatient Visits          1 month ago [place] Vigg, Avanti, MD   7 months ago Centrilobular emphysema ([place])   [place], [place] T, NP   1 year ago Centrilobular emphysema ([place])   [place], [place] T, NP   1 year ago Routine general medical examination at a health care facility   [facility], [place] T, NP   1 year ago Mild episode of recurrent major depressive disorder ([place])   [place], Jolene T, NP             . atorvastatin (LIPITOR) 20 MG tablet  [Pharmacy Med Name: ATORVASTATIN '20MG'$  TABLETS] 90 tablet 1    Sig: TAKE 1 TABLET(20 MG) BY MOUTH DAILY     Cardiovascular:  Antilipid - Statins Failed - 06/07/2021 10:42 AM      Failed - HDL in normal range and within 360 days    HDL  Date Value Ref Range Status  11/01/2020 37 (L) >39 mg/dL Final         Failed - Triglycerides in normal range and within 360 days    Triglycerides  Date Value Ref Range Status  11/01/2020 324 (H) 0 - 149 mg/dL Final   Triglycerides Piccolo,Waived  Date Value Ref Range Status  10/20/2019 276 (H) <150 mg/dL Final    Comment:                            Normal                   <150  Borderline High     150 - 199                         High                200 - 499                         Very High                >499          Passed - Total Cholesterol in normal range and within 360 days    Cholesterol, Total  Date Value Ref Range Status  11/01/2020 167 100 - 199 mg/dL Final   Cholesterol Piccolo, Waived  Date Value Ref Range Status  10/20/2019 166 <200 mg/dL Final    Comment:                            Desirable                <200                         Borderline High      200- 239                         High                     >239          Passed - LDL in normal range and within 360 days    LDL Chol Calc (NIH)  Date Value Ref Range Status  11/01/2020 78 0 - 99 mg/dL Final         Passed - Patient is not pregnant      Passed - Valid encounter within last 12 months    Recent Outpatient Visits          1 month ago [place], MD   7 months ago Centrilobular emphysema ([place])   [place], Henrine Screws T, NP   1 year ago Centrilobular emphysema ([place])   [place], Barbaraann Faster, NP   1 year ago Routine general medical examination at a health care facility   [facility], [place] T, NP   1 year ago Mild episode of  recurrent major depressive disorder [facility])   [place], Barbaraann Faster, NP

## 2021-06-07 NOTE — Telephone Encounter (Signed)
2 weeks since last refill of Voltaren  called pt but LM o VM to see if she needed a refill since it was requested. Attempted numerous times to call this pharmacy- was put on hold.

## 2021-06-07 NOTE — Telephone Encounter (Signed)
Requested Prescriptions  Pending Prescriptions Disp Refills  . diclofenac Sodium (VOLTAREN) 1 % GEL [Pharmacy Med Name: DICLOFENAC 1% GEL 100GM] 100 g 0    Sig: APPLY 2 GRAMS TOPICALLY TO THE AFFECTED AREA FOUR TIMES DAILY     Analgesics:  Topicals Passed - 06/07/2021 10:42 AM      Passed - Valid encounter within last 12 months    Recent Outpatient Visits          1 month ago [place] Vigg, Avanti, MD   7 months ago Centrilobular emphysema ([place])   [place], [place] T, NP   1 year ago Centrilobular emphysema ([place])   [place], Henrine Screws T, NP   1 year ago Routine general medical examination at a health care facility   [facility], [place] T, NP   1 year ago Mild episode of recurrent major depressive disorder ([place])   [place], Barbaraann Faster, NP             Signed Prescriptions Disp Refills   buPROPion (WELLBUTRIN XL) 300 MG 24 hr tablet 90 tablet 1    Sig: TAKE 1 TABLET(300 MG) BY MOUTH DAILY     Psychiatry: Antidepressants - bupropion Passed - 06/07/2021 10:42 AM      Passed - Completed PHQ-2 or PHQ-9 in the last 360 days      Passed - Last BP in normal range    BP Readings from Last 1 Encounters:  11/01/20 136/82         Passed - Valid encounter within last 6 months    Recent Outpatient Visits          1 month ago [place] Vigg, Avanti, MD   7 months ago Centrilobular emphysema ([place])   [place], [place] T, NP   1 year ago Centrilobular emphysema ([place])   [place], [place] T, NP   1 year ago Routine general medical examination at a health care facility   [facility], [place] T, NP   1 year ago Mild episode of recurrent major depressive disorder ([place])   [place], Jolene T, NP              atorvastatin (LIPITOR) 20 MG tablet 90 tablet 1     Sig: TAKE 1 TABLET(20 MG) BY MOUTH DAILY     Cardiovascular:  Antilipid - Statins Failed - 06/07/2021 10:42 AM      Failed - HDL in normal range and within 360 days    HDL  Date Value Ref Range Status  11/01/2020 37 (L) >39 mg/dL Final         Failed - Triglycerides in normal range and within 360 days    Triglycerides  Date Value Ref Range Status  11/01/2020 324 (H) 0 - 149 mg/dL Final   Triglycerides Piccolo,Waived  Date Value Ref Range Status  10/20/2019 276 (H) <150 mg/dL Final    Comment:                            Normal                   <150                         Borderline High  150 - 199                         High                200 - 499                         Very High                >499          Passed - Total Cholesterol in normal range and within 360 days    Cholesterol, Total  Date Value Ref Range Status  11/01/2020 167 100 - 199 mg/dL Final   Cholesterol Piccolo, Waived  Date Value Ref Range Status  10/20/2019 166 <200 mg/dL Final    Comment:                            Desirable                <200                         Borderline High      200- 239                         High                     >239          Passed - LDL in normal range and within 360 days    LDL Chol Calc (NIH)  Date Value Ref Range Status  11/01/2020 78 0 - 99 mg/dL Final         Passed - Patient is not pregnant      Passed - Valid encounter within last 12 months    Recent Outpatient Visits          1 month ago [place], MD   7 months ago Centrilobular emphysema ([place])   [place], Henrine Screws T, NP   1 year ago Centrilobular emphysema ([place])   [place], Barbaraann Faster, NP   1 year ago Routine general medical examination at a health care facility   [facility], [place] T, NP   1 year ago Mild episode of recurrent major depressive disorder [facility])   [place], Barbaraann Faster, NP

## 2021-09-05 ENCOUNTER — Other Ambulatory Visit: Payer: Self-pay | Admitting: Nurse Practitioner

## 2021-09-05 NOTE — Telephone Encounter (Signed)
Requested Prescriptions  Pending Prescriptions Disp Refills  . albuterol (VENTOLIN HFA) 108 (90 Base) MCG/ACT inhaler [Pharmacy Med Name: ALBUTEROL HFA INH (200 PUFFS) 6.7GM] 6.7 g 1    Sig: INHALE 2 PUFFS INTO THE LUNGS EVERY 6 HOURS AS NEEDED FOR WHEEZING OR SHORTNESS OF BREATH     Pulmonology:  Beta Agonists Failed - 09/05/2021 11:05 AM      Failed - One inhaler should last at least one month. If the patient is requesting refills earlier, contact the patient to check for uncontrolled symptoms.      Passed - Valid encounter within last 12 months    Recent Outpatient Visits          4 months ago [place], MD   10 months ago Centrilobular emphysema ([place])   [place], Barbaraann Faster, NP   1 year ago Centrilobular emphysema ([place])   [place], Barbaraann Faster, NP   1 year ago Routine general medical examination at a health care facility   [facility], [place] T, NP   1 year ago Mild episode of recurrent major depressive disorder ([place])   [place], Jolene T, NP             . budesonide-formoterol (SYMBICORT) 160-4.5 MCG/ACT inhaler [Pharmacy Med Name: BUDESONIDE/FORM 160/4.5MCG(120 INH)] 30.6 g 1    Sig: INHALE 2 PUFFS INTO THE LUNGS TWICE DAILY     Pulmonology:  Combination Products Passed - 09/05/2021 11:05 AM      Passed - Valid encounter within last 12 months    Recent Outpatient Visits          4 months ago [place], MD   10 months ago Centrilobular emphysema ([place])   [place], Henrine Screws T, NP   1 year ago Centrilobular emphysema ([place])   [place], Barbaraann Faster, NP   1 year ago Routine general medical examination at a health care facility   [facility], [place] T, NP   1 year ago Mild episode of recurrent major depressive disorder ([place])   [place], Jolene T, NP             . diclofenac Sodium (VOLTAREN) 1 % GEL [Pharmacy Med Name: DICLOFENAC 1% GEL 100GM] 100 g 0    Sig: APPLY 2 GRAMS TOPICALLY TO THE AFFECTED AREA FOUR TIMES DAILY     Analgesics:  Topicals Passed - 09/05/2021 11:05 AM      Passed - Valid encounter within last 12 months    Recent Outpatient Visits          4 months ago [place], MD   10 months ago Centrilobular emphysema ([place])   [place], Geraldine T, NP   1 year ago Centrilobular emphysema ([place])   [place], Barbaraann Faster, NP   1 year ago Routine general medical examination at a health care facility   [facility], [place] T, NP   1 year ago Mild episode of recurrent major depressive disorder [facility])   [place], Barbaraann Faster, NP

## 2021-10-20 ENCOUNTER — Other Ambulatory Visit: Payer: Self-pay

## 2021-10-20 ENCOUNTER — Ambulatory Visit: Payer: Federal, State, Local not specified - PPO | Admitting: Nurse Practitioner

## 2021-10-20 ENCOUNTER — Encounter: Payer: Self-pay | Admitting: Nurse Practitioner

## 2021-10-20 VITALS — BP 142/80 | HR 83 | Temp 98.1°F | Ht 60.32 in | Wt 254.8 lb

## 2021-10-20 DIAGNOSIS — J432 Centrilobular emphysema: Secondary | ICD-10-CM | POA: Diagnosis not present

## 2021-10-20 DIAGNOSIS — G4730 Sleep apnea, unspecified: Secondary | ICD-10-CM

## 2021-10-20 DIAGNOSIS — Q999 Chromosomal abnormality, unspecified: Secondary | ICD-10-CM

## 2021-10-20 DIAGNOSIS — Z6841 Body Mass Index (BMI) 40.0 and over, adult: Secondary | ICD-10-CM

## 2021-10-20 DIAGNOSIS — Z23 Encounter for immunization: Secondary | ICD-10-CM | POA: Diagnosis not present

## 2021-10-20 DIAGNOSIS — Z1211 Encounter for screening for malignant neoplasm of colon: Secondary | ICD-10-CM

## 2021-10-20 DIAGNOSIS — R0602 Shortness of breath: Secondary | ICD-10-CM

## 2021-10-20 DIAGNOSIS — Z87891 Personal history of nicotine dependence: Secondary | ICD-10-CM

## 2021-10-20 DIAGNOSIS — N1831 Chronic kidney disease, stage 3a: Secondary | ICD-10-CM

## 2021-10-20 DIAGNOSIS — I700 Atherosclerosis of aorta: Secondary | ICD-10-CM

## 2021-10-20 DIAGNOSIS — E782 Mixed hyperlipidemia: Secondary | ICD-10-CM | POA: Diagnosis not present

## 2021-10-20 DIAGNOSIS — K760 Fatty (change of) liver, not elsewhere classified: Secondary | ICD-10-CM | POA: Diagnosis not present

## 2021-10-20 DIAGNOSIS — R030 Elevated blood-pressure reading, without diagnosis of hypertension: Secondary | ICD-10-CM | POA: Diagnosis not present

## 2021-10-20 DIAGNOSIS — E559 Vitamin D deficiency, unspecified: Secondary | ICD-10-CM

## 2021-10-20 DIAGNOSIS — N6324 Unspecified lump in the left breast, lower inner quadrant: Secondary | ICD-10-CM | POA: Insufficient documentation

## 2021-10-20 DIAGNOSIS — F321 Major depressive disorder, single episode, moderate: Secondary | ICD-10-CM

## 2021-10-20 LAB — MICROALBUMIN, URINE WAIVED
Creatinine, Urine Waived: 200 mg/dL (ref 10–300)
Microalb, Ur Waived: 150 mg/L — ABNORMAL HIGH (ref 0–19)

## 2021-10-20 MED ORDER — BUPROPION HCL ER (XL) 300 MG PO TB24: ORAL_TABLET | ORAL | 4 refills | Status: DC

## 2021-10-20 NOTE — Assessment & Plan Note (Signed)
Increased over past 6 months with some orthopnea -- obtain echo to further assess and check labs today.  Will consider and discuss addition of Losartan once labs have returned - for both better BP control and kidney protection.  Return in 4 weeks for f/u with spirometry.

## 2021-10-20 NOTE — Assessment & Plan Note (Signed)
Refer to Morbid Obesity plan of care. 

## 2021-10-20 NOTE — Assessment & Plan Note (Signed)
Does not use CPAP, not interested in repeat study at this time.  Plan on repeat study in future if worsening symptoms present. 

## 2021-10-20 NOTE — Assessment & Plan Note (Signed)
Noted on exam today to 6 o'clock position left breast.  Obtain diagnostic imaging, orders in and number provided to patient to scheduled ASAP.  Return in 4 weeks.

## 2021-10-20 NOTE — Assessment & Plan Note (Signed)
Chronic, stable.  Continue current medication regimen and adjust as needed.  Lipid panel today. 

## 2021-10-20 NOTE — Progress Notes (Signed)
BP (!) 142/80    Pulse 83    Temp 98.1 F (36.7 C) (Oral)    Ht 5' 0.32" (1.532 m)    Wt 254 lb 12.8 oz (115.6 kg)    LMP  (LMP Unknown)    SpO2 97%    BMI 49.24 kg/m    Subjective:    Patient ID: Dorothy Cunningham, female    DOB: [birth year], 61 y.o.   MRN: [id number]  HPI: Dorothy Cunningham is a 61 y.o. female  Chief Complaint  Patient presents with   Immunizations    Patient is here to discuss vaccinations with provider. Patient states she is behind on her immunizations. Patient states she would like to discuss with provider on our office getting her set up to have some of these appointments scheduled with our office as she would not like to have everything done once.    Breast Mass    Patient states during a self breast exams and she found a lump in lower L breast and would like to have provider place an order for her to have a Mammogram. Patient states she was in the shower and noticed the lump.    Heart Problem    Patient states she has a lot of heart problems in her family history and states she was diagnosed with COPD and it has been a while since she has been seen in a provider's office. Patient states she would like to discuss family history with provider.    BREAST MASS To left lower breast noticed, reports on past mammograms they have benign issue to this area.   Duration :weeks -- 2 weeks ago Location: left Onset: sudden Redness: no Swelling: no Trauma: no trauma Breastfeeding: no Associated with menstral cycle: no Nipple discharge: no Breast lump: yes Status: stable Treatments attempted: none Previous mammogram: yes   COPD Continues Symbicort daily and Albuterol as needed.  She quit smoking 8 years ago, smoked cigarettes for 30 years -- started at age 36 -- smoked about 1 PPD.    Last CT CA screening was 04/19/20 which noted aortic atherosclerosis, centrilobular and paraseptal emphysema + 3 vessel coronary artery disease.  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 Further evaluation with nonemergent abdominal CT with and without IV contrast is recommended in the near future to better evaluate this finding".  A CT abdomen was ordered, but cost was concern at that time and she did not attend.  Daughter has a MTHFR A1298C disorder and that it can increase risk for cardiac issues or clots.  Patient does not wish to pursue genetic counseling now, but may in the future. COPD status: stable Satisfied with current treatment?: yes Oxygen use: no Dyspnea frequency: increased over past 6 months -- has had to use Albuterol inhaler more, feels better sleeping on two pillows Cough frequency: with activity Rescue inhaler frequency: once a week Limitation of activity: no Productive cough: none Last Spirometry:  11/01/20 Pneumovax: Up To Date Influenza: Up to Date -- today  HYPERLIPIDEMIA Continues on Atorvastatin 20 MG.   Hyperlipidemia status: good compliance Satisfied with current treatment?  yes Side effects:  no Medication compliance: good compliance Past cholesterol meds: Atorvastatin Supplements: none Aspirin:  no The 10-year ASCVD risk score (Arnett DK, et al., 2019) is: 5.5%   Values used to calculate the score:     Age: 59 years     Sex: Female  Is Non-Hispanic African American: No     Diabetic: No     Tobacco smoker: No     Systolic Blood Pressure: 132 mmHg     Is BP treated: No     HDL Cholesterol: 37 mg/dL     Total Cholesterol: 167 mg/dL Chest pain:  no Coronary artery disease:  no Family history CAD:  yes -- father's father and brother and sister Family history early CAD:  no    DEPRESSION Continues on Wellbutrin 300 MG daily.  Has some increased work stressors. Mood status: stable Satisfied with current treatment?: yes Symptom severity: mild  Duration of current treatment : chronic Side effects: no Medication compliance: good compliance Psychotherapy/counseling:  none Previous psychiatric medications: Wellbutrin Depressed mood: no Anxious mood: no Anhedonia: no Significant weight loss or gain: no Insomnia: none Fatigue: somewhat Feelings of worthlessness or guilt: no Impaired concentration/indecisiveness: no Suicidal ideations: no Hopelessness: no Crying spells: no Depression screen [facility] 2/9 10/20/2021 04/17/2021 11/01/2020 04/03/2020 10/06/2019  Decreased Interest 0 0 0 0 0  Down, Depressed, Hopeless 0 0 0 0 0  PHQ - 2 Score 0 0 0 0 0  Altered sleeping 0 - 0 0 0  Tired, decreased energy 3 - 0 1 3  Change in appetite 2 - 0 0 1  Feeling bad or failure about yourself  1 - 0 0 0  Trouble concentrating 1 - 0 0 0  Moving slowly or fidgety/restless 0 - 0 0 0  Suicidal thoughts 0 - 0 0 0  PHQ-9 Score 7 - 0 1 4  Difficult doing work/chores - - - Not difficult at all Not difficult at all   GAD 7 : Generalized Anxiety Score 10/20/2021 05/20/2018  Nervous, Anxious, on Edge 1 1  Control/stop worrying 0 1  Worry too much - different things 0 1  Trouble relaxing 0 0  Restless 0 0  Easily annoyed or irritable 1 1  Afraid - awful might happen 0 0  Total GAD 7 Score 2 4  Anxiety Difficulty Somewhat difficult -   Relevant past medical, surgical, family and social history reviewed and updated as indicated. Interim medical history since our last visit reviewed. Allergies and medications reviewed and updated.  Review of Systems  Constitutional:  Negative for activity change, appetite change, diaphoresis, fatigue and fever.  Respiratory:  Negative for cough, chest tightness and shortness of breath.   Cardiovascular:  Negative for chest pain, palpitations and leg swelling.  Gastrointestinal: Negative.   Neurological: Negative.   Psychiatric/Behavioral: Negative.     Per HPI unless specifically indicated above     Objective:    BP (!) 142/80    Pulse 83    Temp 98.1 F (36.7 C) (Oral)    Ht 5' 0.32" (1.532 m)    Wt 254 lb 12.8 oz (115.6 kg)    LMP  (LMP  Unknown)    SpO2 97%    BMI 49.24 kg/m   Wt Readings from Last 3 Encounters:  10/20/21 254 lb 12.8 oz (115.6 kg)  11/01/20 258 lb 3.2 oz (117.1 kg)  04/19/20 (!) 260 lb (117.9 kg)    Physical Exam Vitals and nursing note reviewed. Exam conducted with a chaperone present.  Constitutional:      General: She is awake. She is not in acute distress.    Appearance: She is well-developed and well-groomed. She is morbidly obese. She is not ill-appearing.  HENT:     Head: Normocephalic.     Right  Ear: Hearing normal.     Left Ear: Hearing normal.  Eyes:     General: Lids are normal.        Right eye: No discharge.        Left eye: No discharge.     Conjunctiva/sclera: Conjunctivae normal.     Pupils: Pupils are equal, round, and reactive to light.  Neck:     Thyroid: No thyromegaly.     Vascular: No carotid bruit.  Cardiovascular:     Rate and Rhythm: Normal rate and regular rhythm.     Heart sounds: Normal heart sounds. No murmur heard.   No gallop.  Pulmonary:     Effort: Pulmonary effort is normal. No accessory muscle usage or respiratory distress.     Breath sounds: Normal breath sounds.  Chest:  Breasts:    Right: Normal.     Left: Mass (to 6 o'clock left breast) and tenderness (at the mass area) present. No swelling, bleeding, inverted nipple, nipple discharge or skin change.     Comments: To 6 o'clock left breast a firm, non mobile walnut size mass is noted.  Tender to touch with no erythema or warmth to area. Abdominal:     General: Bowel sounds are normal.     Palpations: Abdomen is soft.  Musculoskeletal:     Cervical back: Normal range of motion and neck supple.     Right lower leg: No edema.     Left lower leg: No edema.  Skin:    General: Skin is warm and dry.  Neurological:     Mental Status: She is alert and oriented to person, place, and time.  Psychiatric:        Attention and Perception: Attention normal.        Mood and Affect: Mood normal.        Speech:  Speech normal.        Behavior: Behavior normal. Behavior is cooperative.        Thought Content: Thought content normal.     Results for orders placed or performed in visit on 10/20/21  Microalbumin, Urine Waived  Result Value Ref Range   Microalb, Ur Waived 150 (H) 0 - 19 mg/L   Creatinine, Urine Waived 200 10 - 300 mg/dL   Microalb/Creat Ratio 30-300 (H) <30 mg/g      Assessment & Plan:   Problem List Items Addressed This Visit       Cardiovascular and Mediastinum   Aortic atherosclerosis ([place])    Noted on 04/19/20 lung screening CT, along with 3 vessel coronary artery disease.  Educated patient on this and recommend continue statin daily +  ASA 81 MG daily for prevention.  Continue cessation of smoking.  Focus on healthy diet and regular exercise.        Respiratory   Centrilobular emphysema ([place]) - Primary    Chronic, stable with FEV1 88% 2022, 2020 at 85%, and FEV1/FVC 112% 2022.  Continue Symbicort daily and Albuterol as needed, adjust regimen as needed.  Initial lung screening performed and continue annually, as past smoker with 30 year smoking history -- will reach out to team to scheduled.  Plan for repeat spirometry in 2023.  Refills as needed.  Will obtain echo to further assess increase SOB, if reassuring may need to consider change from Symbicort to Anoro or alternate LABA/LAMA inhaler.   Labs today, including Alpha 1 testing.      Relevant Orders   CBC with Differential/Platelet   Alpha-1-antitrypsin  Sleep apnea    Does not use CPAP, not interested in repeat study at this time.  Plan on repeat study in future if worsening symptoms present.        Digestive   Hepatic steatosis    Noted on 04/19/20 lung screening CT.  Educated patient on this.  Focus on healthy diet and regular exercise.      Relevant Orders   Comprehensive metabolic panel     Genitourinary   CKD (chronic kidney disease) stage 3, GFR 30-59 ml/min (HCC)    Recent GFR 53.  Could consider  addition of Losartan if ongoing CKD noted labs today -- urine ALB is 150 today and would benefit from ARB therapy.  Avoid ACE due to COPD.  CMP today.      Relevant Orders   CBC with Differential/Platelet   Comprehensive metabolic panel   TSH   Microalbumin, Urine Waived (Completed)     Other   BMI 50.0-59.9, adult ([place])    Refer to Morbid Obesity plan of care.      Depression    Chronic, stable on Wellbutrin.  Continue current regimen and adjust as needed.  Denies SI/HI.  Refills as needed.      Relevant Medications   buPROPion (WELLBUTRIN XL) 300 MG 24 hr tablet   Elevated BP without diagnosis of hypertension    Ongoing on checks today -- Educated on DASH diet and recommend checking BP at home daily and documenting for provider visits. Will check labs, if ongoing CKD noted along with her urine ALB today 150, will recommend we start Losartan at 25-50 MG daily.  Avoid ACE due to underlying lung disease.  Return in 4 weeks.      Relevant Orders   CBC with Differential/Platelet   Comprehensive metabolic panel   TSH   Microalbumin, Urine Waived (Completed)   Genetic disorder    Daughter has a disorder with  MTHFR A1298C.  Patient does not wish to pursue genetic counseling at this time, but may in future.  Referral was offered.      History of smoking    Continue annual lung CT screening and monitor.      Hyperlipidemia    Chronic, stable.  Continue current medication regimen and adjust as needed.  Lipid panel today.      Relevant Orders   Comprehensive metabolic panel   Lipid Panel w/o Chol/HDL Ratio   Mass of lower inner quadrant of left breast    Noted on exam today to 6 o'clock position left breast.  Obtain diagnostic imaging, orders in and number provided to patient to scheduled ASAP.  Return in 4 weeks.      Relevant Orders   MM DIAG BREAST TOMO BILATERAL   US BREAST LTD UNI LEFT INC AXILLA   Morbid obesity ([place])    BMI 49.24 with COPD and HLD.  Recommended  eating smaller high protein, low fat meals more frequently and exercising 30 mins a day 5 times a week with a goal of 10-15lb weight loss in the next 3 months. Patient voiced their understanding and motivation to adhere to these recommendations.       Shortness of breath    Increased over past 6 months with some orthopnea -- obtain echo to further assess and check labs today.  Will consider and discuss addition of Losartan once labs have returned - for both better BP control and kidney protection.  Return in 4 weeks for f/u with spirometry.  Relevant Orders   ECHOCARDIOGRAM COMPLETE   Vitamin D deficiency    Continue daily supplement and adjust as needed.  Check Vit D level today.      Relevant Orders   VITAMIN D 25 Hydroxy (Vit-D Deficiency, Fractures)   Other Visit Diagnoses     Flu vaccine need       Flu vaccine today.   Relevant Orders   Flu Vaccine QUAD 6+ mos PF IM (Fluarix Quad PF) (Completed)   Colon cancer screening       GI referral placed.   Relevant Orders   Ambulatory referral to Gastroenterology        Follow up plan: Return in about 4 weeks (around 11/17/2021) for COPD and BREAST MASS -- with increase SOB -- spirometry.

## 2021-10-20 NOTE — Assessment & Plan Note (Signed)
Daughter has a disorder with  MTHFR A1298C.  Patient does not wish to pursue genetic counseling at this time, but may in future.  Referral was offered.

## 2021-10-20 NOTE — Patient Instructions (Signed)
Please call to schedule your mammogram and/or bone density: °[facility]  °Address: [address], [place], [place] [address]  °Phone: [phone]  ° °Mammogram °A mammogram is an X-ray of the breasts. This is done to check for changes that are not normal. This test can look for changes that may be caused by breast cancer or other problems. °Mammograms are regularly done on women beginning at age 63. A man may have a mammogram if he has a lump or swelling in his breast. °Tell a doctor: °About any allergies you have. °If you have breast implants. °If you have had breast disease, biopsy, or surgery. °If you have a family history of breast cancer. °If you are breastfeeding. °Whether you are pregnant or may be pregnant. °What are the risks? °Generally, this is a safe procedure. But problems may occur, including: °Being exposed to radiation. Radiation levels are very low with this test. °The need for more tests. °The results were not read properly. °Trouble finding breast cancer in women with dense breasts. °What happens before the test? °Have this test done about 1-2 weeks after your menstrual period. This is often when your breasts are the least tender. °If you are visiting a new doctor or clinic, have any past mammogram images sent to your new doctor's office. °Wash your breasts and under your arms on the day of the test. °Do not use deodorants, perfumes, lotions, or powders on the day of the test. °Take off any jewelry from your neck. °Wear clothes that you can change into and out of easily. °What happens during the test? ° °You will take off your clothes from the waist up. You will put on a gown. °You will stand in front of the X-ray machine. °Each breast will be placed between two plastic or glass plates. The plates will press down on your breast for a few seconds. Try to relax. This does not cause any harm to your breasts. It may not feel comfortable, but it will be very  brief. °X-rays will be taken from different angles of each breast. °The procedure may vary among doctors and hospitals. °What can I expect after the test? °The mammogram will be read by a specialist (radiologist). °You may need to do parts of the test again. This depends on the quality of the images. °You may go back to your normal activities. °It is up to you to get the results of your test. Ask how to get your results when they are ready. °Summary °A mammogram is an X-ray of the breasts. It looks for changes that may be caused by breast cancer or other problems. °A man may have this test if he has a lump or swelling in his breast. °Before the test, tell your doctor about any breast problems that you have had in the past. °Have this test done about 1-2 weeks after your menstrual period. °Ask when your test results will be ready. Make sure you get your test results. °This information is not intended to replace advice given to you by your health care provider. Make sure you discuss any questions you have with your health care provider. °Document Revised: 05/28/2021 Document Reviewed: 07/15/2020 °Elsevier Patient Education © 2022 [employer] ° °

## 2021-10-20 NOTE — Assessment & Plan Note (Signed)
BMI 49.24 with COPD and HLD.  Recommended eating smaller high protein, low fat meals more frequently and exercising 30 mins a day 5 times a week with a goal of 10-15lb weight loss in the next 3 months. Patient voiced their understanding and motivation to adhere to these recommendations.

## 2021-10-20 NOTE — Assessment & Plan Note (Signed)
Noted on 04/19/20 lung screening CT.  Educated patient on this.  Focus on healthy diet and regular exercise. 

## 2021-10-20 NOTE — Assessment & Plan Note (Signed)
Chronic, stable on Wellbutrin.  Continue current regimen and adjust as needed.  Denies SI/HI.  Refills as needed. 

## 2021-10-20 NOTE — Assessment & Plan Note (Signed)
Continue daily supplement and adjust as needed.  Check Vit D level today.

## 2021-10-20 NOTE — Assessment & Plan Note (Addendum)
Chronic, stable with FEV1 88% 2022, 2020 at 85%, and FEV1/FVC 112% 2022.  Continue Symbicort daily and Albuterol as needed, adjust regimen as needed.  Initial lung screening performed and continue annually, as past smoker with 30 year smoking history -- will reach out to team to scheduled.  Plan for repeat spirometry in 2023.  Refills as needed.  Will obtain echo to further assess increase SOB, if reassuring may need to consider change from Symbicort to Anoro or alternate LABA/LAMA inhaler.   Labs today, including Alpha 1 testing.

## 2021-10-20 NOTE — Assessment & Plan Note (Signed)
Noted on 04/19/20 lung screening CT, along with 3 vessel coronary artery disease.  Educated patient on this and recommend continue statin daily +  ASA 81 MG daily for prevention.  Continue cessation of smoking.  Focus on healthy diet and regular exercise.

## 2021-10-20 NOTE — Assessment & Plan Note (Signed)
Continue annual lung CT screening and monitor. 

## 2021-10-20 NOTE — Assessment & Plan Note (Signed)
Recent GFR 53.  Could consider addition of Losartan if ongoing CKD noted labs today -- urine ALB is 150 today and would benefit from ARB therapy.  Avoid ACE due to COPD.  CMP today.

## 2021-10-20 NOTE — Assessment & Plan Note (Signed)
Ongoing on checks today -- Educated on DASH diet and recommend checking BP at home daily and documenting for provider visits. Will check labs, if ongoing CKD noted along with her urine ALB today 150, will recommend we start Losartan at 25-50 MG daily.  Avoid ACE due to underlying lung disease.  Return in 4 weeks.

## 2021-10-21 ENCOUNTER — Other Ambulatory Visit: Payer: Self-pay

## 2021-10-21 ENCOUNTER — Other Ambulatory Visit: Payer: Self-pay | Admitting: Nurse Practitioner

## 2021-10-21 DIAGNOSIS — Z1211 Encounter for screening for malignant neoplasm of colon: Secondary | ICD-10-CM

## 2021-10-21 LAB — COMPREHENSIVE METABOLIC PANEL
ALT: 37 IU/L — ABNORMAL HIGH (ref 0–32)
AST: 31 IU/L (ref 0–40)
Albumin/Globulin Ratio: 1.7 (ref 1.2–2.2)
Albumin: 4.7 g/dL (ref 3.8–4.8)
Alkaline Phosphatase: 70 IU/L (ref 44–121)
BUN/Creatinine Ratio: 11 — ABNORMAL LOW (ref 12–28)
BUN: 14 mg/dL (ref 8–27)
Bilirubin Total: 0.4 mg/dL (ref 0.0–1.2)
CO2: 24 mmol/L (ref 20–29)
Calcium: 10 mg/dL (ref 8.7–10.3)
Chloride: 101 mmol/L (ref 96–106)
Creatinine, Ser: 1.27 mg/dL — ABNORMAL HIGH (ref 0.57–1.00)
Globulin, Total: 2.7 g/dL (ref 1.5–4.5)
Glucose: 91 mg/dL (ref 70–99)
Potassium: 4 mmol/L (ref 3.5–5.2)
Sodium: 144 mmol/L (ref 134–144)
Total Protein: 7.4 g/dL (ref 6.0–8.5)
eGFR: 48 mL/min/{1.73_m2} — ABNORMAL LOW (ref >59)

## 2021-10-21 LAB — TSH: TSH: 2.32 u[IU]/mL (ref 0.450–4.500)

## 2021-10-21 LAB — CBC WITH DIFFERENTIAL/PLATELET
Basophils Absolute: 0.1 10*3/uL (ref 0.0–0.2)
Basos: 1 %
EOS (ABSOLUTE): 0.2 10*3/uL (ref 0.0–0.4)
Eos: 2 %
Hematocrit: 45.7 % (ref 34.0–46.6)
Hemoglobin: 15.9 g/dL (ref 11.1–15.9)
Immature Grans (Abs): 0 10*3/uL (ref 0.0–0.1)
Immature Granulocytes: 0 %
Lymphocytes Absolute: 3.5 10*3/uL — ABNORMAL HIGH (ref 0.7–3.1)
Lymphs: 32 %
MCH: 32.3 pg (ref 26.6–33.0)
MCHC: 34.8 g/dL (ref 31.5–35.7)
MCV: 93 fL (ref 79–97)
Monocytes Absolute: 0.5 10*3/uL (ref 0.1–0.9)
Monocytes: 5 %
Neutrophils Absolute: 6.6 10*3/uL (ref 1.4–7.0)
Neutrophils: 60 %
Platelets: 326 10*3/uL (ref 150–450)
RBC: 4.93 x10E6/uL (ref 3.77–5.28)
RDW: 12.5 % (ref 11.7–15.4)
WBC: 10.9 10*3/uL — ABNORMAL HIGH (ref 3.4–10.8)

## 2021-10-21 LAB — LIPID PANEL W/O CHOL/HDL RATIO
Cholesterol, Total: 138 mg/dL (ref 100–199)
HDL: 39 mg/dL — ABNORMAL LOW (ref >39)
LDL Chol Calc (NIH): 65 mg/dL (ref 0–99)
Triglycerides: 203 mg/dL — ABNORMAL HIGH (ref 0–149)
VLDL Cholesterol Cal: 34 mg/dL (ref 5–40)

## 2021-10-21 LAB — VITAMIN D 25 HYDROXY (VIT D DEFICIENCY, FRACTURES): Vit D, 25-Hydroxy: 39.3 ng/mL (ref 30.0–100.0)

## 2021-10-21 LAB — ALPHA-1-ANTITRYPSIN: A-1 Antitrypsin: 147 mg/dL (ref 101–187)

## 2021-10-21 MED ORDER — PEG 3350-KCL-NA BICARB-NACL 420 G PO SOLR: 4000.0000 mL | Freq: Once | ORAL | 0 refills | Status: AC

## 2021-10-21 MED ORDER — LOSARTAN POTASSIUM 50 MG PO TABS: 50.0000 mg | ORAL_TABLET | Freq: Every day | ORAL | 4 refills | Status: DC

## 2021-10-21 NOTE — Progress Notes (Signed)
Contacted via [place] evening Manus Gunning, your labs have returned: - Cholesterol levels show at goal LDL -- continue Atorvastatin 20 MG daily. - CBC shows mild elevation in white blood cell count and lymphocytes, which have been noted past labs on and off since 2018 -- will recheck this next visit and monitor closely. - Kidney function, creatinine and eGFR, continues to show kidney disease stage 3a.  With your urine having 150 of protein and kidney function levels + mild elevation in BP I would recommend we start Losartan 50 MG daily to help lower BP and also protect kidneys.  I will send this in for you to start and we can discuss further next visit. - Thyroid, Vitamin D and A-1 Antitrypsin testing to check on lung disease are all normal.  Any questions? Keep being awesome!!  Thank you for allowing me to participate in your care.  I appreciate you. Kindest regards, Beauregard Jarrells

## 2021-10-21 NOTE — Progress Notes (Signed)
Gastroenterology Pre-Procedure Review  Request Date: 11/21/2021 Requesting Physician: Dr. Marius Ditch  PATIENT REVIEW QUESTIONS: The patient responded to the following health history questions as indicated:    1. Are you having any GI issues? no 2. Do you have a personal history of Polyps? no 3. Do you have a family history of Colon Cancer or Polyps? no 4. Diabetes Mellitus? no 5. Joint replacements in the past 12 months?no 6. Major health problems in the past 3 months?no 7. Any artificial heart valves, MVP, or defibrillator?no    MEDICATIONS & ALLERGIES:    Patient reports the following regarding taking any anticoagulation/antiplatelet therapy:   Plavix, Coumadin, Eliquis, Xarelto, Lovenox, Pradaxa, Brilinta, or Effient? no Aspirin? no   Patient confirms/reports the following medications:  Current Outpatient Medications  Medication Sig Dispense Refill   albuterol (VENTOLIN HFA) 108 (90 Base) MCG/ACT inhaler INHALE 2 PUFFS INTO THE LUNGS EVERY 6 HOURS AS NEEDED FOR WHEEZING OR SHORTNESS OF BREATH 6.7 g 1   aspirin 81 MG EC tablet      atorvastatin (LIPITOR) 20 MG tablet TAKE 1 TABLET(20 MG) BY MOUTH DAILY 90 tablet 1   budesonide-formoterol (SYMBICORT) 160-4.5 MCG/ACT inhaler INHALE 2 PUFFS INTO THE LUNGS TWICE DAILY 30.6 g 1   buPROPion (WELLBUTRIN XL) 300 MG 24 hr tablet TAKE 1 TABLET(300 MG) BY MOUTH DAILY 90 tablet 4   Chlorpheniramine Maleate (ALLERGY RELIEF PO) Take 1 tablet by mouth as needed.     cyanocobalamin 1000 MCG tablet Take 1,000 mcg by mouth daily.     diclofenac Sodium (VOLTAREN) 1 % GEL APPLY 2 GRAMS TOPICALLY TO THE AFFECTED AREA FOUR TIMES DAILY 100 g 0   fexofenadine (ALLEGRA ALLERGY) 180 MG tablet Take 1 tablet (180 mg total) by mouth daily. 10 tablet 1   fluticasone (FLONASE) 50 MCG/ACT nasal spray SHAKE LIQUID AND USE 2 SPRAYS IN EACH NOSTRIL DAILY 48 g 6   Garlic 10 MG CAPS      Glucosamine-Chondroit-Vit C-Mn (GLUCOSAMINE CHONDR 500 COMPLEX) CAPS Take 2 capsules  by mouth daily.     Multiple Vitamin (MULTIVITAMIN) tablet Take 1 tablet by mouth daily.     nystatin ointment (MYCOSTATIN) APPLY TOPICALLY TO THE AFFECTED AREA THREE TIMES DAILY 60 g 3   VITAMIN D PO Take by mouth.     No current facility-administered medications for this visit.    Patient confirms/reports the following allergies:  Allergies  Allergen Reactions   Penicillins Hives    No orders of the defined types were placed in this encounter.   AUTHORIZATION INFORMATION Primary Insurance: 1D#: Group #:  Secondary Insurance: 1D#: Group #:  SCHEDULE INFORMATION: Date: 11/21/2021 Time: Location:[facility]

## 2021-10-26 ENCOUNTER — Encounter: Payer: Self-pay | Admitting: Nurse Practitioner

## 2021-10-29 DIAGNOSIS — C50912 Malignant neoplasm of unspecified site of left female breast: Secondary | ICD-10-CM

## 2021-10-29 HISTORY — DX: Malignant neoplasm of unspecified site of left female breast: C50.912

## 2021-10-30 ENCOUNTER — Other Ambulatory Visit: Payer: Self-pay | Admitting: Nurse Practitioner

## 2021-10-30 ENCOUNTER — Ambulatory Visit
Admission: RE | Admit: 2021-10-30 | Discharge: 2021-10-30 | Disposition: A | Discharge status: Home / Self Care | Payer: Federal, State, Local not specified - PPO | Source: Ambulatory Visit | Attending: Nurse Practitioner | Admitting: Nurse Practitioner

## 2021-10-30 ENCOUNTER — Other Ambulatory Visit: Payer: Self-pay

## 2021-10-30 DIAGNOSIS — N6320 Unspecified lump in the left breast, unspecified quadrant: Secondary | ICD-10-CM

## 2021-10-30 DIAGNOSIS — R928 Other abnormal and inconclusive findings on diagnostic imaging of breast: Secondary | ICD-10-CM

## 2021-10-30 DIAGNOSIS — N6324 Unspecified lump in the left breast, lower inner quadrant: Secondary | ICD-10-CM | POA: Diagnosis not present

## 2021-10-30 DIAGNOSIS — R922 Inconclusive mammogram: Secondary | ICD-10-CM | POA: Diagnosis not present

## 2021-11-06 ENCOUNTER — Ambulatory Visit
Admission: RE | Admit: 2021-11-06 | Discharge: 2021-11-06 | Disposition: A | Discharge status: Home / Self Care | Payer: Federal, State, Local not specified - PPO | Source: Ambulatory Visit | Attending: Nurse Practitioner | Admitting: Nurse Practitioner

## 2021-11-06 ENCOUNTER — Other Ambulatory Visit: Payer: Self-pay

## 2021-11-06 ENCOUNTER — Telehealth: Payer: Self-pay | Admitting: Acute Care

## 2021-11-06 DIAGNOSIS — R928 Other abnormal and inconclusive findings on diagnostic imaging of breast: Secondary | ICD-10-CM | POA: Insufficient documentation

## 2021-11-06 DIAGNOSIS — N6323 Unspecified lump in the left breast, lower outer quadrant: Secondary | ICD-10-CM | POA: Diagnosis not present

## 2021-11-06 DIAGNOSIS — N6320 Unspecified lump in the left breast, unspecified quadrant: Secondary | ICD-10-CM | POA: Insufficient documentation

## 2021-11-06 DIAGNOSIS — C50512 Malignant neoplasm of lower-outer quadrant of left female breast: Secondary | ICD-10-CM | POA: Diagnosis not present

## 2021-11-06 DIAGNOSIS — Z170 Estrogen receptor positive status [ER+]: Secondary | ICD-10-CM | POA: Diagnosis not present

## 2021-11-06 HISTORY — PX: BREAST BIOPSY: SHX20

## 2021-11-06 NOTE — Telephone Encounter (Signed)
Left message for pt to call back to schedule f/u lung screening CT scan.  ?

## 2021-11-07 NOTE — Progress Notes (Signed)
Navigation initiated.  Patient would like to see Dr. Bary Castilla for surgical consult.  Will schedule this consult and Med/Onc consult on Monday and notify patient.

## 2021-11-10 DIAGNOSIS — C50919 Malignant neoplasm of unspecified site of unspecified female breast: Secondary | ICD-10-CM

## 2021-11-13 DIAGNOSIS — K828 Other specified diseases of gallbladder: Secondary | ICD-10-CM | POA: Diagnosis not present

## 2021-11-13 DIAGNOSIS — C50512 Malignant neoplasm of lower-outer quadrant of left female breast: Secondary | ICD-10-CM | POA: Diagnosis not present

## 2021-11-14 ENCOUNTER — Encounter: Payer: Self-pay | Admitting: Oncology

## 2021-11-14 ENCOUNTER — Other Ambulatory Visit: Payer: Self-pay | Admitting: Nurse Practitioner

## 2021-11-14 ENCOUNTER — Encounter: Payer: Self-pay | Admitting: *Deleted

## 2021-11-14 ENCOUNTER — Inpatient Hospital Stay: Payer: Federal, State, Local not specified - PPO

## 2021-11-14 ENCOUNTER — Other Ambulatory Visit: Payer: Self-pay

## 2021-11-14 ENCOUNTER — Inpatient Hospital Stay: Payer: Federal, State, Local not specified - PPO | Attending: Oncology | Admitting: Oncology

## 2021-11-14 VITALS — BP 169/86 | HR 85 | Temp 97.4°F | Wt 258.0 lb

## 2021-11-14 DIAGNOSIS — Z836 Family history of other diseases of the respiratory system: Secondary | ICD-10-CM | POA: Insufficient documentation

## 2021-11-14 DIAGNOSIS — C50919 Malignant neoplasm of unspecified site of unspecified female breast: Secondary | ICD-10-CM | POA: Diagnosis not present

## 2021-11-14 DIAGNOSIS — E785 Hyperlipidemia, unspecified: Secondary | ICD-10-CM | POA: Diagnosis not present

## 2021-11-14 DIAGNOSIS — Z8349 Family history of other endocrine, nutritional and metabolic diseases: Secondary | ICD-10-CM | POA: Insufficient documentation

## 2021-11-14 DIAGNOSIS — N1831 Chronic kidney disease, stage 3a: Secondary | ICD-10-CM | POA: Insufficient documentation

## 2021-11-14 DIAGNOSIS — Z803 Family history of malignant neoplasm of breast: Secondary | ICD-10-CM | POA: Diagnosis not present

## 2021-11-14 DIAGNOSIS — Z8249 Family history of ischemic heart disease and other diseases of the circulatory system: Secondary | ICD-10-CM | POA: Diagnosis not present

## 2021-11-14 DIAGNOSIS — Z87891 Personal history of nicotine dependence: Secondary | ICD-10-CM | POA: Diagnosis not present

## 2021-11-14 DIAGNOSIS — C50812 Malignant neoplasm of overlapping sites of left female breast: Secondary | ICD-10-CM | POA: Insufficient documentation

## 2021-11-14 DIAGNOSIS — Z833 Family history of diabetes mellitus: Secondary | ICD-10-CM | POA: Insufficient documentation

## 2021-11-14 DIAGNOSIS — D751 Secondary polycythemia: Secondary | ICD-10-CM | POA: Diagnosis not present

## 2021-11-14 DIAGNOSIS — Z7189 Other specified counseling: Secondary | ICD-10-CM | POA: Diagnosis not present

## 2021-11-14 DIAGNOSIS — Z8261 Family history of arthritis: Secondary | ICD-10-CM | POA: Insufficient documentation

## 2021-11-14 DIAGNOSIS — Z79899 Other long term (current) drug therapy: Secondary | ICD-10-CM | POA: Diagnosis not present

## 2021-11-14 DIAGNOSIS — G4730 Sleep apnea, unspecified: Secondary | ICD-10-CM | POA: Insufficient documentation

## 2021-11-14 DIAGNOSIS — Z880 Allergy status to penicillin: Secondary | ICD-10-CM | POA: Diagnosis not present

## 2021-11-14 LAB — CBC WITH DIFFERENTIAL/PLATELET
Abs Immature Granulocytes: 0.04 10*3/uL (ref 0.00–0.07)
Basophils Absolute: 0.1 10*3/uL (ref 0.0–0.1)
Basophils Relative: 1 %
Eosinophils Absolute: 0.2 10*3/uL (ref 0.0–0.5)
Eosinophils Relative: 2 %
HCT: 43.8 % (ref 36.0–46.0)
Hemoglobin: 15.1 g/dL — ABNORMAL HIGH (ref 12.0–15.0)
Immature Granulocytes: 0 %
Lymphocytes Relative: 39 %
Lymphs Abs: 3.7 10*3/uL (ref 0.7–4.0)
MCH: 32.2 pg (ref 26.0–34.0)
MCHC: 34.5 g/dL (ref 30.0–36.0)
MCV: 93.4 fL (ref 80.0–100.0)
Monocytes Absolute: 0.4 10*3/uL (ref 0.1–1.0)
Monocytes Relative: 4 %
Neutro Abs: 5 10*3/uL (ref 1.7–7.7)
Neutrophils Relative %: 54 %
Platelets: 307 10*3/uL (ref 150–400)
RBC: 4.69 MIL/uL (ref 3.87–5.11)
RDW: 12.8 % (ref 11.5–15.5)
WBC: 9.3 10*3/uL (ref 4.0–10.5)
nRBC: 0 % (ref 0.0–0.2)

## 2021-11-14 NOTE — Progress Notes (Signed)
Hematology/Oncology Consult note [phone] [phone]         Patient Care Team: Venita Lick, NP as PCP - General (Nurse Practitioner) Theodore Demark, RN as Oncology Nurse Navigator  REFERRING PROVIDER: Venita Lick, NP  CHIEF COMPLAINTS/REASON FOR VISIT:  Evaluation of left breast cancer  HISTORY OF PRESENTING ILLNESS:   Dorothy Cunningham is a  61 y.o.  female with PMH listed below was seen in consultation at the request of  Dorothy Guarneri T, NP  for evaluation of left breast cancer  Patient felt a left breast mass. 10/30/2021, bilateral diagnostic mammogram showed 21 mm mass in the left breast, 5:00, 9 cm from nipple.  No suspicious left axillary lymphadenopathy.  No mammographic evidence of malignancy in the right breast.  11/06/2021, left breast ultrasound-guided biopsy showed invasive mammary carcinoma, no special type.  Grade 3, DCIS not identified, LVI not identified.  ER/PR HER2 status are pending.  Menarche 61 years of age, patient has 3 children.  She was 49 years old when she gave birth to her first child.  Short-term OCP use from [phone].  Patient has a history of hysterectomy 1990.  She denies family history of breast cancer. Denies any hormone replacement therapy.  Denies any chest radiation.  She recalls left breast biopsy in 2012.  She did not know details of the pathology.  Review of Systems  Constitutional:  Negative for appetite change, chills, fatigue and fever.  HENT:   Negative for hearing loss and voice change.   Eyes:  Negative for eye problems.  Respiratory:  Negative for chest tightness and cough.   Cardiovascular:  Negative for chest pain.  Gastrointestinal:  Negative for abdominal distention, abdominal pain and blood in stool.  Endocrine: Negative for hot flashes.  Genitourinary:  Negative for difficulty urinating and frequency.   Musculoskeletal:  Negative for arthralgias.  Skin:  Negative for itching and rash.   Neurological:  Negative for extremity weakness.  Hematological:  Negative for adenopathy.  Psychiatric/Behavioral:  Negative for confusion.    MEDICAL HISTORY:  Past Medical History:  Diagnosis Date   Allergic rhinitis    Anxiety    COPD (chronic obstructive pulmonary disease) ([place])    Depression    History of epilepsy    as a teenager   Hyperlipidemia    Sleep apnea     SURGICAL HISTORY: Past Surgical History:  Procedure Laterality Date   Bladder Tuck  1995   BREAST BIOPSY Left 2014   benign   BREAST BIOPSY Left 11/06/2021   u/s bx  5 o'clock, VENUS clip- INVASIVE MAMMARY CARCINOMA,   PARTIAL HYSTERECTOMY  1995   due to heavy periods    SOCIAL HISTORY: Social History   Socioeconomic History   Marital status: Married    Spouse name: Not on file   Number of children: Not on file   Years of education: Not on file   Highest education level: Not on file  Occupational History   Not on file  Tobacco Use   Smoking status: Former    Packs/day: 0.75    Years: 40.00    Pack years: 30.00    Types: Cigarettes    Quit date: 09/28/2014    Years since quitting: 7.1   Smokeless tobacco: Never  Vaping Use   Vaping Use: Former  Substance and Sexual Activity   Alcohol use: No   Drug use: No   Sexual activity: Never  Other Topics Concern   Not on  file  Social History Narrative   Not on file   Social Determinants of Health   Financial Resource Strain: Not on file  Food Insecurity: Not on file  Transportation Needs: Not on file  Physical Activity: Not on file  Stress: Not on file  Social Connections: Not on file  Intimate Partner Violence: Not on file    FAMILY HISTORY: Family History  Problem Relation Age of Onset   Thyroid disease Mother    Asthma Mother    Osteoarthritis Father    CAD Father    Diabetes Maternal Uncle    Diabetes Maternal Grandfather    Tuberculosis Maternal Grandfather    Diabetes Paternal Grandmother    Heart attack Paternal  Grandfather    Breast cancer Other        great grandmother    ALLERGIES:  is allergic to penicillins.  MEDICATIONS:  Current Outpatient Medications  Medication Sig Dispense Refill   albuterol (VENTOLIN HFA) 108 (90 Base) MCG/ACT inhaler INHALE 2 PUFFS INTO THE LUNGS EVERY 6 HOURS AS NEEDED FOR WHEEZING OR SHORTNESS OF BREATH 6.7 g 1   aspirin 81 MG EC tablet      atorvastatin (LIPITOR) 20 MG tablet TAKE 1 TABLET(20 MG) BY MOUTH DAILY 90 tablet 1   budesonide-formoterol (SYMBICORT) 160-4.5 MCG/ACT inhaler INHALE 2 PUFFS INTO THE LUNGS TWICE DAILY 30.6 g 1   buPROPion (WELLBUTRIN XL) 300 MG 24 hr tablet TAKE 1 TABLET(300 MG) BY MOUTH DAILY 90 tablet 4   Chlorpheniramine Maleate (ALLERGY RELIEF PO) Take 1 tablet by mouth as needed.     cyanocobalamin 1000 MCG tablet Take 1,000 mcg by mouth daily.     diclofenac Sodium (VOLTAREN) 1 % GEL APPLY 2 GRAMS TOPICALLY TO THE AFFECTED AREA FOUR TIMES DAILY 100 g 0   fexofenadine (ALLEGRA ALLERGY) 180 MG tablet Take 1 tablet (180 mg total) by mouth daily. 10 tablet 1   fluticasone (FLONASE) 50 MCG/ACT nasal spray SHAKE LIQUID AND USE 2 SPRAYS IN EACH NOSTRIL DAILY 48 g 6   Garlic 10 MG CAPS      Glucosamine-Chondroit-Vit C-Mn (GLUCOSAMINE CHONDR 500 COMPLEX) CAPS Take 2 capsules by mouth daily.     losartan (COZAAR) 50 MG tablet Take 1 tablet (50 mg total) by mouth daily. 90 tablet 4   Multiple Vitamin (MULTIVITAMIN) tablet Take 1 tablet by mouth daily.     nystatin ointment (MYCOSTATIN) APPLY TOPICALLY TO THE AFFECTED AREA THREE TIMES DAILY 60 g 3   VITAMIN D PO Take by mouth.     No current facility-administered medications for this visit.     PHYSICAL EXAMINATION: ECOG PERFORMANCE STATUS: 0 - Asymptomatic Vitals:   11/14/21 1102  BP: (!) 169/86  Pulse: 85  Temp: (!) 97.4 F (36.3 C)   Filed Weights   11/14/21 1102  Weight: 258 lb (117 kg)    Physical Exam Constitutional:      General: She is not in acute distress. HENT:      Head: Normocephalic and atraumatic.  Eyes:     General: No scleral icterus. Cardiovascular:     Rate and Rhythm: Normal rate and regular rhythm.     Heart sounds: Normal heart sounds.  Pulmonary:     Effort: Pulmonary effort is normal. No respiratory distress.     Breath sounds: No wheezing.  Abdominal:     General: Bowel sounds are normal. There is no distension.     Palpations: Abdomen is soft.  Musculoskeletal:  General: No deformity. Normal range of motion.     Cervical back: Normal range of motion and neck supple.  Skin:    General: Skin is warm and dry.     Findings: No erythema or rash.  Neurological:     Mental Status: She is alert and oriented to person, place, and time. Mental status is at baseline.     Cranial Nerves: No cranial nerve deficit.     Coordination: Coordination normal.  Psychiatric:        Mood and Affect: Mood normal.   Palpable left lower outer quadrant mass.  No palpable axillary lymphadenopathy bilaterally.  No palpable mass in the right breast.  LABORATORY DATA:  I have reviewed the data as listed Lab Results  Component Value Date   WBC 9.3 11/14/2021   HGB 15.1 (H) 11/14/2021   HCT 43.8 11/14/2021   MCV 93.4 11/14/2021   PLT 307 11/14/2021   Recent Labs    10/20/21 1552  NA 144  K 4.0  CL 101  CO2 24  GLUCOSE 91  BUN 14  CREATININE 1.27*  CALCIUM 10.0  PROT 7.4  ALBUMIN 4.7  AST 31  ALT 37*  ALKPHOS 70  BILITOT 0.4   Iron/TIBC/Ferritin/ %Sat No results found for: IRON, TIBC, FERRITIN, IRONPCTSAT    RADIOGRAPHIC STUDIES: I have personally reviewed the radiological images as listed and agreed with the findings in the report. US BREAST LTD UNI LEFT INC AXILLA  Result Date: 10/30/2021 CLINICAL DATA:  Palpable area in the LEFT breast. EXAM: DIGITAL DIAGNOSTIC BILATERAL MAMMOGRAM WITH TOMOSYNTHESIS AND CAD; ULTRASOUND LEFT BREAST LIMITED TECHNIQUE: Bilateral digital diagnostic mammography and breast tomosynthesis was  performed. The images were evaluated with computer-aided detection.; Targeted ultrasound examination of the left breast was performed. COMPARISON:  Prior mammogram dated April 28, 2011 ACR Breast Density Category b: There are scattered areas of fibroglandular density. FINDINGS: Spot compression tomosynthesis views of the site of palpable concern in the LEFT breast demonstrate an irregular spiculated mass with associated skin retraction. It measures approximately 21 mm. Spot magnification views were performed of the LEFT breast. There are benign dystrophic calcifications in the LEFT lower inner breast at a site of stable LEFT breast mass consistent with a fibroadenoma. There is a 3 mm group of layering calcifications in the LEFT lower inner breast at anterior depth consistent with benign milk of calcium. A questioned asymmetry in the RIGHT breast resolved with additional views, consistent with overlapping tissue. No suspicious mass, distortion, or microcalcifications are identified to suggest presence of malignancy in the RIGHT breast. On physical exam, there is a hard mass in the LEFT lower breast with associated skin dimpling. Targeted ultrasound was performed of the site of palpable concern. At 5 o'clock 9 cm from the nipple, there is an irregular mass with spiculated margins and an echogenic rind. It measures 2.1 x 1.5 x 1.8 cm. Spiculations closely approximate the dermis. Targeted ultrasound was performed of the LEFT axilla. No suspicious axillary lymph nodes are seen. IMPRESSION: 1. A 21 mm mass in the LEFT breast at 5 o'clock 9 cm from the nipple is concerning for malignancy. Recommend ultrasound-guided biopsy for definitive characterization. 2. No suspicious LEFT axillary adenopathy. 3. No mammographic evidence of malignancy in the RIGHT breast. RECOMMENDATION: LEFT breast ultrasound-guided biopsy x1 I have discussed the findings and recommendations with the patient. The biopsy procedure was discussed with the  patient and questions were answered. Patient expressed their understanding of the biopsy recommendation. Patient will be  scheduled for biopsy at her earliest convenience by the schedulers. Ordering provider will be notified. If applicable, a reminder letter will be sent to the patient regarding the next appointment. BI-RADS CATEGORY  5: Highly suggestive of malignancy. Electronically Signed   By: Valentino Saxon M.D.   On: 10/30/2021 13:04  MM DIAG BREAST TOMO BILATERAL  Result Date: 10/30/2021 CLINICAL DATA:  Palpable area in the LEFT breast. EXAM: DIGITAL DIAGNOSTIC BILATERAL MAMMOGRAM WITH TOMOSYNTHESIS AND CAD; ULTRASOUND LEFT BREAST LIMITED TECHNIQUE: Bilateral digital diagnostic mammography and breast tomosynthesis was performed. The images were evaluated with computer-aided detection.; Targeted ultrasound examination of the left breast was performed. COMPARISON:  Prior mammogram dated April 28, 2011 ACR Breast Density Category b: There are scattered areas of fibroglandular density. FINDINGS: Spot compression tomosynthesis views of the site of palpable concern in the LEFT breast demonstrate an irregular spiculated mass with associated skin retraction. It measures approximately 21 mm. Spot magnification views were performed of the LEFT breast. There are benign dystrophic calcifications in the LEFT lower inner breast at a site of stable LEFT breast mass consistent with a fibroadenoma. There is a 3 mm group of layering calcifications in the LEFT lower inner breast at anterior depth consistent with benign milk of calcium. A questioned asymmetry in the RIGHT breast resolved with additional views, consistent with overlapping tissue. No suspicious mass, distortion, or microcalcifications are identified to suggest presence of malignancy in the RIGHT breast. On physical exam, there is a hard mass in the LEFT lower breast with associated skin dimpling. Targeted ultrasound was performed of the site of palpable  concern. At 5 o'clock 9 cm from the nipple, there is an irregular mass with spiculated margins and an echogenic rind. It measures 2.1 x 1.5 x 1.8 cm. Spiculations closely approximate the dermis. Targeted ultrasound was performed of the LEFT axilla. No suspicious axillary lymph nodes are seen. IMPRESSION: 1. A 21 mm mass in the LEFT breast at 5 o'clock 9 cm from the nipple is concerning for malignancy. Recommend ultrasound-guided biopsy for definitive characterization. 2. No suspicious LEFT axillary adenopathy. 3. No mammographic evidence of malignancy in the RIGHT breast. RECOMMENDATION: LEFT breast ultrasound-guided biopsy x1 I have discussed the findings and recommendations with the patient. The biopsy procedure was discussed with the patient and questions were answered. Patient expressed their understanding of the biopsy recommendation. Patient will be scheduled for biopsy at her earliest convenience by the schedulers. Ordering provider will be notified. If applicable, a reminder letter will be sent to the patient regarding the next appointment. BI-RADS CATEGORY  5: Highly suggestive of malignancy. Electronically Signed   By: Valentino Saxon M.D.   On: 10/30/2021 13:04  MM CLIP PLACEMENT LEFT  Result Date: 11/06/2021 CLINICAL DATA:  Post procedure mammogram for clip placement EXAM: 3D DIAGNOSTIC LEFT MAMMOGRAM POST ULTRASOUND BIOPSY COMPARISON:  Previous exam(s). FINDINGS: 3D Mammographic images were obtained following ultrasound guided biopsy of a mass in the left breast at 5 o'clock. The biopsy marking clip is in expected position at the site of biopsy. IMPRESSION: Appropriate positioning of the Venus shaped biopsy marking clip at the site of biopsy in the left breast at 5 o'clock. Final Assessment: Post Procedure Mammograms for Marker Placement Electronically Signed   By: Audie Pinto M.D.   On: 11/06/2021 09:32  [place] LT BREAST BX W LOC DEV 1ST LESION IMG BX SPEC US GUIDE  Addendum Date: 11/07/2021    ADDENDUM REPORT: 11/07/2021 12:59 ADDENDUM: PATHOLOGY revealed: A. BREAST, LEFT; ULTRASOUND-GUIDED BIOPSY: -  INVASIVE MAMMARY CARCINOMA, NO SPECIAL TYPE. Size of invasive carcinoma: 12 mm in this sample. Grade 3. Ductal carcinoma in situ: Not identified. Lymphovascular invasion: Not identified. Pathology results are CONCORDANT with imaging findings, per Dr. Audie Pinto. Pathology results and recommendations were discussed with patient via telephone on 11/07/2021. Patient reported biopsy site doing well with no adverse symptoms, and only slight tenderness at the site. Post biopsy care instructions were reviewed, questions were answered and my direct phone number was provided. Patient was instructed to call [facility] for any additional questions or concerns related to biopsy site. RECOMMENDATIONS: Surgical consultation. Request for surgical consultation relayed to Al Pimple RN and Tanya Nones RN at [facility] by Electa Sniff RN on 11/07/2021. Pathology results reported by Electa Sniff RN on 11/07/2021. Electronically Signed   By: Audie Pinto M.D.   On: 11/07/2021 12:59   Result Date: 11/07/2021 CLINICAL DATA:  61 year old female presenting for biopsy of a mass in the left breast. EXAM: ULTRASOUND GUIDED LEFT BREAST CORE NEEDLE BIOPSY COMPARISON:  Previous exam(s). PROCEDURE: I met with the patient and we discussed the procedure of ultrasound-guided biopsy, including benefits and alternatives. We discussed the high likelihood of a successful procedure. We discussed the risks of the procedure, including infection, bleeding, tissue injury, clip migration, and inadequate sampling. Informed written consent was given. The usual time-out protocol was performed immediately prior to the procedure. Lesion quadrant: Lower outer quadrant Using sterile technique and 1% Lidocaine as local anesthetic, under direct ultrasound visualization, a 14 gauge spring-loaded device was used to  perform biopsy of a mass in the left breast at 5 o'clock using a lateral approach. At the conclusion of the procedure a Venus tissue marker clip was deployed into the biopsy cavity. Follow up 2 view mammogram was performed and dictated separately. IMPRESSION: Ultrasound guided biopsy of a mass in the left breast at 5 o'clock. No apparent complications. Electronically Signed: By: Audie Pinto M.D. On: 11/06/2021 09:33      ASSESSMENT & PLAN:  1. Invasive carcinoma of breast ([place])   2. Goals of care, counseling/discussion   3. Stage 3a chronic kidney disease ([place])     Cancer Staging  Invasive carcinoma of breast ([place]) Staging form: Breast, AJCC 8th Edition - Clinical stage from 11/14/2021: cT2, cN0, cM0, G3, ER: Unknown, PR: Unknown, HER2: Unknown - Signed by Earlie Server, MD on 11/14/2021  # Left breast invasive carcinoma ER/PR/HER2 status pending.  T2 lesion, roles of neoadjuvant/adjuvant chemo is dependant on the receptor status, which is still pending at the time of dictation. Reviewed plan of surgery, adjuvant radiation, and possible endocrine therapy.   CKD, avoid nephrotoxin.  Erythrocytosis mild. Monitor.   Check cbc cmp CA 15.3, CA 27.29, FSH, estradiol.  Orders Placed This Encounter  Procedures   CBC with Differential/Platelet    Standing Status:   Future    Number of Occurrences:   1    Standing Expiration Date:   11/14/2022   Cancer antigen 15-3    Standing Status:   Future    Number of Occurrences:   1    Standing Expiration Date:   11/14/2022   Cancer antigen 27.29    Standing Status:   Future    Number of Occurrences:   1    Standing Expiration Date:   11/14/2022   Estradiol    Standing Status:   Future    Number of Occurrences:   1    Standing Expiration Date:  2/89/7915   Follicle stimulating hormone    Standing Status:   Future    Number of Occurrences:   1    Standing Expiration Date:   11/14/2022    All questions were answered. The patient knows to call the  clinic with any problems questions or concerns.  cc Venita Lick, NP    Return of visit: TBD Thank you for this kind referral and the opportunity to participate in the care of this patient. A copy of today's note is routed to referring provider   Earlie Server, MD, PhD [facility] Hematology Oncology 11/14/2021

## 2021-11-14 NOTE — Progress Notes (Signed)
Janeann Merl, RN gave patient breast cancer educational literature, "My Breast Cancer Treatment Handbook" by Josephine Igo, RN.   Patient to call with any questions or needs.

## 2021-11-15 LAB — CANCER ANTIGEN 15-3: CA 15-3: 8 U/mL (ref 0.0–25.0)

## 2021-11-15 LAB — ESTRADIOL: Estradiol: 9.4 pg/mL

## 2021-11-15 LAB — CANCER ANTIGEN 27.29: CA 27.29: 8.7 U/mL (ref 0.0–38.6)

## 2021-11-15 LAB — FOLLICLE STIMULATING HORMONE: FSH: 52 m[IU]/mL

## 2021-11-15 NOTE — Patient Instructions (Addendum)
Increased Losartan to 100 MG daily (can start taking 2 of your 50 MG pills daily until gone).  Stop Symbicort and start Anoro inhaler.  Breast Cancer, Female Breast cancer is a malignant growth of tissue (tumor) in the breast. Unlike noncancerous (benign) tumors, malignant tumors are cancerous and can spread to other parts of the body. The two most common types of breast cancer start in the milk ducts (ductal carcinoma) or in the lobules where milk is made in the breast (lobular carcinoma). Breast cancer is one of the most common types of cancer in women. What are the causes? The exact cause of female breast cancer is unknown. What increases the risk? The following factors may make you more likely to develop this condition: Being older than 63 years of age. Race and ethnicity. Caucasian women generally have an increased risk, but African-American women are more likely to develop the disease before age 12. Having a family history of breast cancer. Having had breast cancer in the past. Having certain noncancerous conditions of the breast, such as dense breast tissue. Having the BRCA1 and BRCA2 genes. Having a history of radiation exposure. Obesity. Starting menopause after age 82. Starting your menstrual periods before age 52. Having never been pregnant or having your first child after age 37. Having never breastfed. Using hormone therapy after menopause. Using birth control pills. Drinking more than one alcoholic drink a day. Exposure to the drug DES, which was given to pregnant women from the 1940s to the 1970s. What are the signs or symptoms? Symptoms of this condition include: A painless lump or thickening in your breast. Changes in the size or shape of your breast. Breast skin changes, such as puckering or dimpling. Nipple abnormalities, such as scaling, crustiness, redness, or pulling in (retraction). Nipple discharge that is bloody or clear. How is this diagnosed? This condition  may be diagnosed by: Taking your medical history and doing a physical exam. During the exam, your health care provider will feel the tissue around your breast and under your arms. Taking a sample of nipple discharge. The sample will be examined under a microscope. Performing imaging tests, such as breast X-rays (mammogram), breast ultrasound exams, or an MRI. Taking a tissue sample (biopsy) from the breast. The sample will be examined under a microscope to look for cancer cells. Taking a sample from the lymph nodes near the affected breast (sentinel node biopsy). Your cancer will be staged to determine its severity and extent. Staging is a careful attempt to find out the size of the tumor, whether the cancer has spread, and if so, to what parts of the body. Staging also includes testing your tumor for certain receptors, such as estrogen, progesterone, and human epidermal growth factor receptor 2 (HER2). This will help your cancer care team decide on a treatment that will work best for you. You may need to have more tests to determine the stage of your cancer. Stages include the following: Stage 0--The tumor has not spread to other breast tissue. Stage I--The cancer is only found in the breast or may be in the lymph nodes. The tumor may be up to  in (2 cm) wide. Stage II--The cancer has spread to nearby lymph nodes. The tumor may be up to 2 in (5 cm) wide. Stage III--The cancer has spread to more distant lymph nodes. The tumor may be larger than 2 in (5 cm) wide. Stage IV--The cancer has spread to other parts of the body, such as the bones,  brain, liver, or lungs. How is this treated? Treatment for this condition depends on the type and stage of the breast cancer. It may be treated with: Surgery. This may involve breast-conserving surgery (lumpectomy or partial mastectomy) in which only the part of the breast containing the cancer is removed. Some normal tissue surrounding this area may also be  removed. In some cases, surgery may be done to remove the entire breast (mastectomy) and nipple. Lymph nodes may also be removed. Radiation therapy, which uses high-energy rays to kill cancer cells. Chemotherapy, which is the use of drugs to kill cancer cells. Hormone therapy, which involves taking medicine to adjust the hormone levels in your body. You may take medicine to decrease your estrogen levels. This can help stop cancer cells from growing. Targeted therapy, in which drugs are used to block the growth and spread of cancer cells. These drugs target a specific part of the cancer cell and usually cause fewer side effects than chemotherapy. Targeted therapy may be used alone or in combination with chemotherapy. A combination of surgery, radiation, chemotherapy, or hormone therapy may be needed to treat breast cancer. Follow these instructions at home: Take over-the-counter and prescription medicines only as told by your health care provider. Eat a healthy diet. A healthy diet includes lots of fruits and vegetables, low-fat dairy products, lean meats, and fiber. Make sure half your plate is filled with fruits or vegetables. Choose high-fiber foods such as whole-grain breads and cereals. Consider joining a support group. This may help you learn to cope with the stress of having breast cancer. Talk to your health care team about exercise and physical activity. The right exercise program can: Help prevent or reduce symptoms such as fatigue or depression. Improve overall health and survival rates. Keep all follow-up visits as told by your health care provider. This is important. Where to find more information American Cancer Society: www.cancer.[place]: www.cancer.gov Contact a health care provider if: You have a sudden increase in pain. You have any symptoms or changes that concern you. You lose weight without trying. You notice a new lump in either breast or under your  arm. You develop swelling in either arm or hand. You have a fever. You notice new fatigue or weakness. Get help right away if: You have chest pain or trouble breathing. You faint. Summary Breast cancer is a malignant growth of tissue (tumor) in the breast. Your cancer will be staged to determine its severity and extent. Treatment for this condition depends on the type and stage of the breast cancer. This information is not intended to replace advice given to you by your health care provider. Make sure you discuss any questions you have with your health care provider. Document Revised: 08/27/2017 Document Reviewed: 05/10/2017 Elsevier Patient Education  2022 [employer].

## 2021-11-15 NOTE — Telephone Encounter (Signed)
Requested Prescriptions  Pending Prescriptions Disp Refills   albuterol (VENTOLIN HFA) 108 (90 Base) MCG/ACT inhaler [Pharmacy Med Name: ALBUTEROL HFA INH (200 PUFFS) 6.7GM] 6.7 g 0    Sig: INHALE 2 PUFFS INTO THE LUNGS EVERY 6 HOURS AS NEEDED FOR WHEEZING OR SHORTNESS OF BREATH     Pulmonology:  Beta Agonists 2 Failed - 11/14/2021  1:34 PM      Failed - Last BP in normal range    BP Readings from Last 1 Encounters:  11/14/21 (!) 169/86         Passed - Last Heart Rate in normal range    Pulse Readings from Last 1 Encounters:  11/14/21 85         Passed - Valid encounter within last 12 months    Recent Outpatient Visits          3 weeks ago Centrilobular emphysema ([place])   [place], Henrine Screws T, NP   7 months ago COVID-49   [employer], Avanti, MD   1 year ago Centrilobular emphysema ([place])   [place], Henrine Screws T, NP   1 year ago Centrilobular emphysema ([place])   [place], Barbaraann Faster, NP   1 year ago Routine general medical examination at a health care facility   [facility], Barbaraann Faster, NP      Future Appointments            In 2 days Cannady, Barbaraann Faster, NP [employer]

## 2021-11-17 ENCOUNTER — Ambulatory Visit: Payer: Federal, State, Local not specified - PPO | Admitting: Nurse Practitioner

## 2021-11-17 ENCOUNTER — Other Ambulatory Visit: Payer: Self-pay

## 2021-11-17 ENCOUNTER — Encounter: Payer: Self-pay | Admitting: Nurse Practitioner

## 2021-11-17 VITALS — BP 144/90 | HR 79 | Temp 98.2°F | Ht 60.32 in | Wt 253.4 lb

## 2021-11-17 DIAGNOSIS — N1831 Chronic kidney disease, stage 3a: Secondary | ICD-10-CM

## 2021-11-17 DIAGNOSIS — I10 Essential (primary) hypertension: Secondary | ICD-10-CM | POA: Diagnosis not present

## 2021-11-17 DIAGNOSIS — C50919 Malignant neoplasm of unspecified site of unspecified female breast: Secondary | ICD-10-CM | POA: Diagnosis not present

## 2021-11-17 DIAGNOSIS — R808 Other proteinuria: Secondary | ICD-10-CM

## 2021-11-17 DIAGNOSIS — J432 Centrilobular emphysema: Secondary | ICD-10-CM | POA: Diagnosis not present

## 2021-11-17 LAB — MICROALBUMIN, URINE WAIVED
Creatinine, Urine Waived: 300 mg/dL (ref 10–300)
Microalb, Ur Waived: 80 mg/L — ABNORMAL HIGH (ref 0–19)

## 2021-11-17 MED ORDER — LOSARTAN POTASSIUM 100 MG PO TABS: 100.0000 mg | ORAL_TABLET | Freq: Every day | ORAL | 4 refills | Status: DC

## 2021-11-17 MED ORDER — UMECLIDINIUM-VILANTEROL 62.5-25 MCG/ACT IN AEPB: 1.0000 | INHALATION_SPRAY | Freq: Every day | RESPIRATORY_TRACT | 4 refills | Status: DC

## 2021-11-17 NOTE — Progress Notes (Signed)
BP (!) 144/90 (BP Location: Left Arm, Patient Position: Sitting, Cuff Size: Large)    Pulse 79    Temp 98.2 F (36.8 C) (Oral)    Ht 5' 0.32" (1.532 m)    Wt 253 lb 6.4 oz (114.9 kg)    LMP  (LMP Unknown)    SpO2 97%    BMI 48.97 kg/m    Subjective:    Patient ID: Dorothy Cunningham, female    DOB: [birth year], 61 y.o.   MRN: [id number]  HPI: Dorothy Cunningham is a 61 y.o. female  Chief Complaint  Patient presents with   COPD    Patient states she doing OK with the COPD and states she is not out of breathe as much anymore, but states she has been walking a bit more.    Breast Mass    Patient states she is currently waiting on some testing and states they did inform her that it was malignant and deciding what the next steps are going to do.    BREAST CANCER: New diagnosis on 11/14/21 with invasive carcinoma left breast, no lymph node involvement noted.  Seen by oncology last on 11/14/21 with further assessment being performed to determine next steps.  She endorses her greatest sadness comes from thought of losing hair.    COPD Continues Symbicort daily and Albuterol as needed.  She quit smoking 8 years ago, smoked cigarettes for 30 years -- started at age 76 -- smoked about 1 PPD.     CT CA screening on 04/19/20 which noted aortic atherosclerosis, centrilobular and paraseptal emphysema + 3 vessel coronary artery disease.  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 Further evaluation with nonemergent abdominal CT with and without IV contrast is recommended in the near future to better evaluate this finding".  A CT abdomen was ordered, but cost issue at that time and she did not attend.  Has not attended for repeat CT lung screening.   Daughter has a MTHFR A1298C disorder and that it can increase risk for cardiac issues or clots.  Patient does not wish to pursue genetic counseling now, but may in the future. COPD status: stable Satisfied  with current treatment?: yes Oxygen use: no Dyspnea frequency: improved some Cough frequency: with activity Rescue inhaler frequency: once a week Limitation of activity: no Productive cough: none Last Spirometry:  11/17/21 FEV1 62% and FEV1/FVC 81%, previous 88% and 112% Pneumovax: Up To Date Influenza: Up to Date  CHRONIC KIDNEY DISEASE Noted recent labs with proteinuria at 150 - taking Losartan 50 MG daily and tolerating well. CKD status: stable Medications renally dose: yes Previous renal evaluation: no Pneumovax:  Up to Date Influenza Vaccine:  Up to Date   Relevant past medical, surgical, family and social history reviewed and updated as indicated. Interim medical history since our last visit reviewed. Allergies and medications reviewed and updated.  Review of Systems  Constitutional:  Negative for activity change, appetite change, diaphoresis, fatigue and fever.  Respiratory:  Positive for shortness of breath. Negative for cough, chest tightness and wheezing.   Cardiovascular:  Negative for chest pain, palpitations and leg swelling.  Gastrointestinal: Negative.   Neurological: Negative.   Psychiatric/Behavioral: Negative.     Per HPI unless specifically indicated above     Objective:    BP (!) 144/90 (BP Location: Left Arm, Patient Position: Sitting, Cuff Size: Large)    Pulse 79    Temp 98.2 F (  36.8 C) (Oral)    Ht 5' 0.32" (1.532 m)    Wt 253 lb 6.4 oz (114.9 kg)    LMP  (LMP Unknown)    SpO2 97%    BMI 48.97 kg/m   Wt Readings from Last 3 Encounters:  11/17/21 253 lb 6.4 oz (114.9 kg)  11/14/21 258 lb (117 kg)  10/20/21 254 lb 12.8 oz (115.6 kg)    Physical Exam Vitals and nursing note reviewed.  Constitutional:      General: She is awake. She is not in acute distress.    Appearance: She is well-developed and well-groomed. She is morbidly obese. She is not ill-appearing.  HENT:     Head: Normocephalic.     Right Ear: Hearing normal.     Left Ear: Hearing  normal.  Eyes:     General: Lids are normal.        Right eye: No discharge.        Left eye: No discharge.     Conjunctiva/sclera: Conjunctivae normal.     Pupils: Pupils are equal, round, and reactive to light.  Neck:     Thyroid: No thyromegaly.     Vascular: No carotid bruit.  Cardiovascular:     Rate and Rhythm: Normal rate and regular rhythm.     Heart sounds: Normal heart sounds. No murmur heard.   No gallop.  Pulmonary:     Effort: Pulmonary effort is normal. No accessory muscle usage or respiratory distress.     Breath sounds: Normal breath sounds.  Abdominal:     General: Bowel sounds are normal.     Palpations: Abdomen is soft.  Musculoskeletal:     Cervical back: Normal range of motion and neck supple.     Right lower leg: No edema.     Left lower leg: No edema.  Skin:    General: Skin is warm and dry.  Neurological:     Mental Status: She is alert and oriented to person, place, and time.  Psychiatric:        Attention and Perception: Attention normal.        Mood and Affect: Mood normal.        Speech: Speech normal.        Behavior: Behavior normal. Behavior is cooperative.        Thought Content: Thought content normal.   Results for orders placed or performed in visit on 25/05/39  Follicle stimulating hormone  Result Value Ref Range   FSH 52.0 mIU/mL  Estradiol  Result Value Ref Range   Estradiol 9.4 pg/mL  Cancer antigen 27.29  Result Value Ref Range   CA 27.29 8.7 0.0 - 38.6 U/mL  Cancer antigen 15-3  Result Value Ref Range   CA 15-3 8.0 0.0 - 25.0 U/mL  CBC with Differential/Platelet  Result Value Ref Range   WBC 9.3 4.0 - 10.5 K/uL   RBC 4.69 3.87 - 5.11 MIL/uL   Hemoglobin 15.1 (H) 12.0 - 15.0 g/dL   HCT 43.8 36.0 - 46.0 %   MCV 93.4 80.0 - 100.0 fL   MCH 32.2 26.0 - 34.0 pg   MCHC 34.5 30.0 - 36.0 g/dL   RDW 12.8 11.5 - 15.5 %   Platelets 307 150 - 400 K/uL   nRBC 0.0 0.0 - 0.2 %   Neutrophils Relative % 54 %   Neutro Abs 5.0 1.7 -  7.7 K/uL   Lymphocytes Relative 39 %   Lymphs Abs 3.7 0.7 -  4.0 K/uL   Monocytes Relative 4 %   Monocytes Absolute 0.4 0.1 - 1.0 K/uL   Eosinophils Relative 2 %   Eosinophils Absolute 0.2 0.0 - 0.5 K/uL   Basophils Relative 1 %   Basophils Absolute 0.1 0.0 - 0.1 K/uL   Immature Granulocytes 0 %   Abs Immature Granulocytes 0.04 0.00 - 0.07 K/uL      Assessment & Plan:   Problem List Items Addressed This Visit       Cardiovascular and Mediastinum   Essential hypertension    Chronic, ongoing with elevations.  Will increase Losartan to 100 MG daily and adjust as needed. Recheck BMP and urine ALB today.  Educated on [place] and recommend checking BP at home daily and documenting for provider visits.  Avoid ACE due to underlying lung disease.  Return in 6 weeks.      Relevant Medications   losartan (COZAAR) 100 MG tablet   Other Relevant Orders   Basic metabolic panel     Respiratory   Centrilobular emphysema (HCC)    Chronic, ongoing with FEV1 62% today, 2022 at 88%, and FEV1/FVC 81% today.  Stop Symbicort and start Anoro + continue Albuterol as needed, discussed at length change with patient.  Continue lung screening annually, as past smoker with 30 year smoking history -- will reach out to team to scheduled.  Refills as needed.  Return in 6 weeks.      Relevant Medications   umeclidinium-vilanterol (ANORO ELLIPTA) 62.5-25 MCG/ACT AEPB   Other Relevant Orders   Spirometry with Graph (Completed)     Genitourinary   Stage 3a chronic kidney disease ([place])    Ongoing with recent urine ALB 150, continue ARB for kidney protection.  Recheck BMP and urine ALB today.  Avoid ACE due to COPD.  Consider nephrology referral if decline in future.      Relevant Orders   Basic metabolic panel   Microalbumin, Urine Waived     Other   Invasive carcinoma of breast ([place]) - Primary    Continue collaboration with oncology at this time.  Recent notes reviewed.      Other proteinuria     Noted past labs at 150 and Losartan started, increasing dose today.  Check BMP and urine ALB today.      Relevant Orders   Microalbumin, Urine Waived     Follow up plan: Return in about 6 weeks (around 12/29/2021) for HTN and COPD .

## 2021-11-17 NOTE — Assessment & Plan Note (Signed)
Noted past labs at 150 and Losartan started, increasing dose today.  Check BMP and urine ALB today.

## 2021-11-17 NOTE — Assessment & Plan Note (Signed)
Continue collaboration with oncology at this time.  Recent notes reviewed. 

## 2021-11-17 NOTE — Assessment & Plan Note (Signed)
Chronic, ongoing with elevations.  Will increase Losartan to 100 MG daily and adjust as needed. Recheck BMP and urine ALB today.  Educated on [place] and recommend checking BP at home daily and documenting for provider visits.  Avoid ACE due to underlying lung disease.  Return in 6 weeks.

## 2021-11-17 NOTE — Assessment & Plan Note (Signed)
Ongoing with recent urine ALB 150, continue ARB for kidney protection.  Recheck BMP and urine ALB today.  Avoid ACE due to COPD.  Consider nephrology referral if decline in future.

## 2021-11-17 NOTE — Assessment & Plan Note (Signed)
Chronic, ongoing with FEV1 62% today, 2022 at 88%, and FEV1/FVC 81% today.  Stop Symbicort and start Anoro + continue Albuterol as needed, discussed at length change with patient.  Continue lung screening annually, as past smoker with 30 year smoking history -- will reach out to team to scheduled.  Refills as needed.  Return in 6 weeks.

## 2021-11-18 LAB — BASIC METABOLIC PANEL
BUN/Creatinine Ratio: 8 — ABNORMAL LOW (ref 12–28)
BUN: 10 mg/dL (ref 8–27)
CO2: 24 mmol/L (ref 20–29)
Calcium: 10.1 mg/dL (ref 8.7–10.3)
Chloride: 102 mmol/L (ref 96–106)
Creatinine, Ser: 1.18 mg/dL — ABNORMAL HIGH (ref 0.57–1.00)
Glucose: 89 mg/dL (ref 70–99)
Potassium: 3.7 mmol/L (ref 3.5–5.2)
Sodium: 141 mmol/L (ref 134–144)
eGFR: 52 mL/min/{1.73_m2} — ABNORMAL LOW (ref >59)

## 2021-11-18 NOTE — Progress Notes (Signed)
Contacted via [place] morning Manus Gunning, your labs have returned and kidney function is showing some improvement in creatinine and eGFR -- GREAT NEWS!!  Still some mild kidney disease present, but Losartan is offering benefit, as urine protein has gone from 150 to now 80 too.  Any questions?  Ensure you are drinking plenty of water at home and add a little lemon.  Keep being amazing!!  Thank you for allowing me to participate in your care.  I appreciate you. Kindest regards, Dianelly Ferran

## 2021-11-19 ENCOUNTER — Encounter: Payer: Self-pay | Admitting: Nurse Practitioner

## 2021-11-21 ENCOUNTER — Ambulatory Visit
Admission: RE | Admit: 2021-11-21 | Payer: Federal, State, Local not specified - PPO | Source: Home / Self Care | Admitting: Gastroenterology

## 2021-11-21 ENCOUNTER — Encounter: Admission: RE | Payer: Self-pay | Source: Home / Self Care

## 2021-11-21 SURGERY — COLONOSCOPY WITH PROPOFOL
Anesthesia: General

## 2021-11-25 ENCOUNTER — Encounter: Payer: Self-pay | Admitting: General Practice

## 2021-11-25 LAB — SURGICAL PATHOLOGY

## 2021-11-26 ENCOUNTER — Inpatient Hospital Stay: Payer: Federal, State, Local not specified - PPO | Attending: Oncology | Admitting: Hospice and Palliative Medicine

## 2021-11-26 ENCOUNTER — Other Ambulatory Visit: Payer: Self-pay

## 2021-11-26 DIAGNOSIS — C50919 Malignant neoplasm of unspecified site of unspecified female breast: Secondary | ICD-10-CM

## 2021-11-26 NOTE — Progress Notes (Signed)
Multidisciplinary Oncology Council Documentation ? ?Dorothy Cunningham was presented by our [facility] on 11/26/2021, which included representatives from:  ?Palliative Care ?Dietitian  ?Physical/Occupational Therapist ?Nurse Navigator ?Genetics ?Speech Therapist ?Social work ?Survivorship RN ?Financial Navigator ?Research RN ? ? ?Dorothy Cunningham currently presents with history of breast cancer ? ?We reviewed previous medical and familial history, history of present illness, and recent lab results along with all available histopathologic and imaging studies. [facility] considered available treatment options and made the following recommendations/referrals: ? ?Social work, Soil scientist, rehab screening ? ?The MOC is a meeting of clinicians from various specialty areas who evaluate and discuss patients for whom a multidisciplinary approach is being considered. Final determinations in the plan of care are those of the provider(s).  ? ?Today's extended care, comprehensive team conference, Dorothy Cunningham was not present for the discussion and was not examined.  ? ?

## 2021-11-28 ENCOUNTER — Telehealth: Payer: Self-pay | Admitting: *Deleted

## 2021-12-01 ENCOUNTER — Encounter: Payer: Self-pay | Admitting: Licensed Clinical Social Worker

## 2021-12-01 NOTE — Progress Notes (Signed)
[facility] Clinical Social Work  ?Initial Assessment ? ? ?Dorothy Cunningham is a 61 y.o. year old female contacted by phone. Clinical Social Work was referred by  Palliative Care NP  for assessment of psychosocial needs.  ? ?SDOH (Social Determinants of Health) assessments performed: Yes ?  ?Distress Screen completed: No ?No flowsheet data found. ? ? ? ?Family/Social Information:  ?Housing Arrangement: patient lives with spouse   [name] [phone] (work) ?Family members/support persons in your life? Family and Friends/Colleagues ?Transportation concerns: no  ?Employment: Working full time. Income source: Employment ?Financial concerns:  No, but is concerned about possible loss of income as treatment continues.  Patient is main breadwinner in the home. ?Type of concern: Medical bills ?Food access concerns: no ?Religious or spiritual practice: N/A ?Services Currently in place:  [facility] insurance ? ?Coping/ Adjustment to diagnosis: ?Patient understands treatment plan and what happens next? yes ?Concerns about diagnosis and/or treatment: Pain or discomfort during procedures, How I will care for other members of my family, Losing my job, and How I will pay for the services I need ?Patient reported stressors: Work/ school, Finances, and Adjusting to my illness ?Hopes and priorities: Patient hopes to continue working during treatment. ?Patient enjoys reading and time with family/ friends ?Current coping skills/ strengths: Ability for insight , Active sense of humor , Average or above average intelligence , Capable of independent living , Communication skills , Financial means , Motivation for treatment/growth , and Work skills  ? ? ? SUMMARY: ?Current SDOH Barriers:  ?Limited social support, Mental Health Concerns , and financial stressors ? ?Clinical Social Work Clinical Goal(s):  ?patient will follow up with HR department and request information on ADA accommodations, FMLA and short-term disability * as directed by  SW ? ?Interventions: ?Discussed common feeling and emotions when being diagnosed with cancer, and the importance of support during treatment ?Informed patient of the support team roles and support services at [facility] ?Provided CSW contact information and encouraged patient to call with any questions or concerns ?Referred patient to employer's HR Dept and Provided patient with information about role of CSW in patient care and available resources. ? ? ?Follow Up Plan: Patient will contact CSW with any support or resource needs ?Patient verbalizes understanding of plan: Yes ? ? ? ?[name], LCSW ?

## 2021-12-04 ENCOUNTER — Telehealth: Payer: Self-pay | Admitting: *Deleted

## 2021-12-04 NOTE — Telephone Encounter (Signed)
Spoke with patient regarding MOC referral for OT screen and genetics. Discussed referrals with patient. Patient is agreeable to these referrals. ? ?Patient is planning a lumpectomy with Dr. Bary Castilla. She will meet with the surgeon next week. She would like to wait until 4/19 to have the OT screen and genetics as she is uncertain of her exact surgery date. ? ?Pt thanked me for calling her regarding the referrals. ?

## 2021-12-04 NOTE — Telephone Encounter (Signed)
Duplicate encounter

## 2021-12-11 ENCOUNTER — Other Ambulatory Visit: Payer: Self-pay | Admitting: Nurse Practitioner

## 2021-12-11 DIAGNOSIS — C50512 Malignant neoplasm of lower-outer quadrant of left female breast: Secondary | ICD-10-CM | POA: Diagnosis not present

## 2021-12-11 NOTE — Telephone Encounter (Signed)
Requested Prescriptions  ?Pending Prescriptions Disp Refills  ?? albuterol (VENTOLIN HFA) 108 (90 Base) MCG/ACT inhaler [Pharmacy Med Name: ALBUTEROL HFA INH (200 PUFFS) 6.7GM] 6.7 g 0  ?  Sig: INHALE 2 PUFFS INTO THE LUNGS EVERY 6 HOURS AS NEEDED FOR WHEEZING OR SHORTNESS OF BREATH  ?  ? Pulmonology:  Beta Agonists 2 Failed - 12/11/2021  8:28 AM  ?  ?  Failed - Last BP in normal range  ?  BP Readings from Last 1 Encounters:  ?11/17/21 (!) 144/90  ?   ?  ?  Passed - Last Heart Rate in normal range  ?  Pulse Readings from Last 1 Encounters:  ?11/17/21 79  ?   ?  ?  Passed - Valid encounter within last 12 months  ?  Recent Outpatient Visits   ?      ? 3 weeks ago Invasive carcinoma of breast ([place])  ? [place], [place] T, NP  ? 1 month ago Centrilobular emphysema ([place])  ? [facility], [place] T, NP  ? 7 months ago COVID-19  ? [facility] [place], Avanti, MD  ? 1 year ago Centrilobular emphysema ([place])  ? [place], [place] T, NP  ? 1 year ago Centrilobular emphysema ([place])  ? [facility] [place], Henrine Screws T, NP  ?  ?  ?Future Appointments   ?        ? In 2 weeks Cannady, Barbaraann Faster, NP [employer] [facility]  ?  ? ?  ?  ?  ? ? ?

## 2021-12-12 ENCOUNTER — Other Ambulatory Visit: Payer: Self-pay | Admitting: General Surgery

## 2021-12-12 NOTE — Progress Notes (Signed)
Subjective:  ?  ? Patient ID: Dorothy Cunningham is a 61 y.o. female. ?  ?HPI ?  ?The following portions of the patient's history were reviewed and updated as appropriate. ?  ?This an established patient is here today for: office visit. The patient is here today to discuss options for breast cancer surgery.  ?  ?She is here with her husband, Dorothy Cunningham. ?  ?Bra size: 42 DDD ?  ?   ?Chief Complaint  ?Patient presents with  ? Pre-op Exam  ?  ?  ?BP (!) 153/83   Pulse 89   Temp 36.5 ?C (97.7 ?F)   Ht 154.9 cm (_0 )   Wt (!) 117 kg (258 lb)   SpO2 97%   BMI 48.75 kg/m?  ?  ?    ?Past Medical History:  ?Diagnosis Date  ? Allergic rhinitis    ? Anxiety    ? Breast cancer (CMS-HCC) 11/06/2021  ?  left  ? COPD (chronic obstructive pulmonary disease) (CMS-HCC)    ? Depression    ? History of epilepsy    ? Hyperlipidemia    ? Sleep apnea    ?  ?  ?     ?Past Surgical History:  ?Procedure Laterality Date  ? bladder tuck   1995  ? ABDOMINAL HYSTERECTOMY   1995  ?  partial- due to heavy periods  ? INCISIONAL BIOPSY BREAST Left 2014  ?  benign  ? ultrasound guided breast biopsy Left 11/06/2021  ? COLONOSCOPY      ?  ?  ?  ?        ?OB History   ?  Gravida  ?3  ? Para  ?3  ? Term  ?   ? Preterm  ?   ? AB  ?   ? Living  ?   ?  ?  SAB  ?   ? IAB  ?   ? Ectopic  ?   ? Molar  ?   ? Multiple  ?   ? Live Births  ?   ?  ?  ?  Obstetric Comments  ?Age at first period 54 ?Age of first pregnancy 30 ?Partial Hysterectomy 1995 ?   ?  ?   ?  ?  ?Social History  ?  ?  ?     ?Socioeconomic History  ? Marital status: Married  ?Tobacco Use  ? Smoking status: Former  ?    Packs/day: 0.75  ?    Years: 40.00  ?    Pack years: 30.00  ?    Types: Cigarettes  ?    Quit date: 09/28/2014  ?    Years since quitting: 7.2  ? Smokeless tobacco: Never  ?Substance and Sexual Activity  ? Alcohol use: Never  ? Drug use: Never  ?  ?  ?  ?    ?Allergies  ?Allergen Reactions  ? Penicillin Hives  ?  ?  ?Current Medications  ?      ?Current Outpatient Medications   ?Medication Sig Dispense Refill  ? albuterol 90 mcg/actuation inhaler Inhale 2 inhalations into the lungs every 6 (six) hours as needed      ? aspirin 81 MG EC tablet once daily      ? atorvastatin (LIPITOR) 20 MG tablet Take 20 mg by mouth once daily      ? budesonide-formoteroL (SYMBICORT) 160-4.5 mcg/actuation inhaler Inhale 2 inhalations into the lungs 2 (two) times daily      ?  buPROPion (WELLBUTRIN XL) 300 MG XL tablet once daily      ? cholecalciferol (VITAMIN D3) 1000 unit capsule Take 1,000 Units by mouth once daily      ? diclofenac (VOLTAREN) 1 % topical gel as needed      ? fexofenadine (ALLEGRA) 180 MG tablet Take by mouth as needed      ? fluticasone propionate (FLONASE) 50 mcg/actuation nasal spray once daily      ? glucosamine-chondroit-vit C-Mn (GLUCOSAMINE-CHONDROITIN COMPLX) capsule Take 2 capsules by mouth once daily      ? losartan (COZAAR) 50 MG tablet Take 50 mg by mouth once daily      ? multivitamin tablet Take 1 tablet by mouth once daily      ? omega 3-dha-epa-fish oil (FISH OIL) 100-160-1,000 mg Cap Take by mouth once daily      ?  ?No current facility-administered medications for this visit.  ?  ?  ?  ?     ?Family History  ?Problem Relation Age of Onset  ? Asthma Mother    ? Thyroid disease Mother    ? Osteoarthritis Father    ? Coronary Artery Disease (Blocked arteries around heart) Father    ? Breast cancer Maternal Grandmother    ?      great grandmother  ? Diabetes Maternal Grandfather    ? Tuberculosis Maternal Grandfather    ? Diabetes Paternal Grandmother    ? Myocardial Infarction (Heart attack) Paternal Grandfather    ? Diabetes Maternal Uncle    ?  ?  ?  ?Labs and Radiology:  ?  ?Clinical T2, N0 carcinoma of the left breast.  ER/PR positive, HER2 negative. ?  ?July 2021 CT showing evidence of porcelain gallbladder.  Classically considered to be caused by chronic cholecystitis.  Relative risk of gallbladder cancer is unknown. ?  ?  ?  ?  ?Review of Systems  ?Constitutional:  Negative for chills and fever.  ?Respiratory: Negative for cough.   ?  ?  ?   ?Objective:  ? Physical Exam ?Constitutional:   ?   Appearance: Normal appearance.  ?Cardiovascular:  ?   Rate and Rhythm: Normal rate and regular rhythm.  ?   Pulses: Normal pulses.  ?   Heart sounds: Normal heart sounds.  ?Pulmonary:  ?   Effort: Pulmonary effort is normal.  ?   Breath sounds: Normal breath sounds.  ?Musculoskeletal:  ?   Cervical back: Neck supple.  ?Skin: ?   General: Skin is warm and dry.  ?Neurological:  ?   Mental Status: She is alert and oriented to person, place, and time.  ?Psychiatric:     ?   Mood and Affect: Mood normal.     ?   Behavior: Behavior normal.  ?  ?  ?  ?   ?Assessment:  ?   ?Breast cancer, candidate for breast conservation. ?   ?Plan:  ?   ?The patient has been seen by medical oncology.  Upfront surgery and sentinel node biopsy is planned. ?  ?The patient may be a candidate for elective cholecystectomy in the future based on evidence of her porcelain gallbladder.  This will be deferred till after her cancer treatment is completed. ?   ?This note is partially prepared by Ledell Noss, CMA acting as a scribe in the presence of Dr. Hervey Ard, MD.  ?  ?The documentation recorded by the scribe accurately reflects the service I personally performed and the decisions made by  me.  ?  ?Robert Bellow, MD FACS ?

## 2021-12-15 ENCOUNTER — Other Ambulatory Visit: Payer: Self-pay | Admitting: General Surgery

## 2021-12-15 DIAGNOSIS — C50919 Malignant neoplasm of unspecified site of unspecified female breast: Secondary | ICD-10-CM

## 2021-12-15 NOTE — Progress Notes (Unsigned)
Subjective:  ?  ? Patient ID: Dorothy Cunningham is a 61 y.o. female. ?  ?HPI ?  ?The following portions of the patient's history were reviewed and updated as appropriate. ?  ?This an established patient is here today for: office visit. The patient is here today to discuss options for breast cancer surgery.  ?  ?She is here with her husband, [name]. ?  ?Bra size: 42 DDD ?  ?   ?Chief Complaint  ?Patient presents with  ? Pre-op Exam  ?  ?  ?BP (!) 153/83   Pulse 89   Temp 36.5 ?C (97.7 ?F)   Ht 154.9 cm (_0 )   Wt (!) 117 kg (258 lb)   SpO2 97%   BMI 48.75 kg/m?  ?  ?    ?Past Medical History:  ?Diagnosis Date  ? Allergic rhinitis    ? Anxiety    ? Breast cancer (CMS-HCC) 11/06/2021  ?  left  ? COPD (chronic obstructive pulmonary disease) (CMS-HCC)    ? Depression    ? History of epilepsy    ? Hyperlipidemia    ? Sleep apnea    ?  ?  ?     ?Past Surgical History:  ?Procedure Laterality Date  ? bladder tuck   1995  ? ABDOMINAL HYSTERECTOMY   1995  ?  partial- due to heavy periods  ? INCISIONAL BIOPSY BREAST Left 2014  ?  benign  ? ultrasound guided breast biopsy Left 11/06/2021  ? COLONOSCOPY      ?  ?  ?  ?        ?OB History   ?  Gravida  ?3  ? Para  ?3  ? Term  ?   ? Preterm  ?   ? AB  ?   ? Living  ?   ?  ?  SAB  ?   ? IAB  ?   ? Ectopic  ?   ? Molar  ?   ? Multiple  ?   ? Live Births  ?   ?  ?  ?  Obstetric Comments  ?Age at first period 54 ?Age of first pregnancy 30 ?Partial Hysterectomy 1995 ?   ?  ?   ?  ?  ?Social History  ?  ?  ?     ?Socioeconomic History  ? Marital status: Married  ?Tobacco Use  ? Smoking status: Former  ?    Packs/day: 0.75  ?    Years: 40.00  ?    Pack years: 30.00  ?    Types: Cigarettes  ?    Quit date: 09/28/2014  ?    Years since quitting: 7.2  ? Smokeless tobacco: Never  ?Substance and Sexual Activity  ? Alcohol use: Never  ? Drug use: Never  ?  ?  ?  ?    ?Allergies  ?Allergen Reactions  ? Penicillin Hives  ?  ?  ?Current Medications  ?      ?Current Outpatient Medications   ?Medication Sig Dispense Refill  ? albuterol 90 mcg/actuation inhaler Inhale 2 inhalations into the lungs every 6 (six) hours as needed      ? aspirin 81 MG EC tablet once daily      ? atorvastatin (LIPITOR) 20 MG tablet Take 20 mg by mouth once daily      ? budesonide-formoteroL (SYMBICORT) 160-4.5 mcg/actuation inhaler Inhale 2 inhalations into the lungs 2 (two) times daily      ?  buPROPion (WELLBUTRIN XL) 300 MG XL tablet once daily      ? cholecalciferol (VITAMIN D3) 1000 unit capsule Take 1,000 Units by mouth once daily      ? diclofenac (VOLTAREN) 1 % topical gel as needed      ? fexofenadine (ALLEGRA) 180 MG tablet Take by mouth as needed      ? fluticasone propionate (FLONASE) 50 mcg/actuation nasal spray once daily      ? glucosamine-chondroit-vit C-Mn (GLUCOSAMINE-CHONDROITIN COMPLX) capsule Take 2 capsules by mouth once daily      ? losartan (COZAAR) 50 MG tablet Take 50 mg by mouth once daily      ? multivitamin tablet Take 1 tablet by mouth once daily      ? omega 3-dha-epa-fish oil (FISH OIL) 100-160-1,000 mg Cap Take by mouth once daily      ?  ?No current facility-administered medications for this visit.  ?  ?  ?  ?     ?Family History  ?Problem Relation Age of Onset  ? Asthma Mother    ? Thyroid disease Mother    ? Osteoarthritis Father    ? Coronary Artery Disease (Blocked arteries around heart) Father    ? Breast cancer Maternal Grandmother    ?      great grandmother  ? Diabetes Maternal Grandfather    ? Tuberculosis Maternal Grandfather    ? Diabetes Paternal Grandmother    ? Myocardial Infarction (Heart attack) Paternal Grandfather    ? Diabetes Maternal Uncle    ?  ?  ?  ?Labs and Radiology:  ?  ?Clinical T2, N0 carcinoma of the left breast.  ER/PR positive, HER2 negative. ?  ?July 2021 CT showing evidence of porcelain gallbladder.  Classically considered to be caused by chronic cholecystitis.  Relative risk of gallbladder cancer is unknown. ?  ?  ?  ?  ?Review of Systems  ?Constitutional:  Negative for chills and fever.  ?Respiratory: Negative for cough.   ?  ?  ?   ?Objective:  ? Physical Exam ?Constitutional:   ?   Appearance: Normal appearance.  ?Cardiovascular:  ?   Rate and Rhythm: Normal rate and regular rhythm.  ?   Pulses: Normal pulses.  ?   Heart sounds: Normal heart sounds.  ?Pulmonary:  ?   Effort: Pulmonary effort is normal.  ?   Breath sounds: Normal breath sounds.  ?Musculoskeletal:  ?   Cervical back: Neck supple.  ?Skin: ?   General: Skin is warm and dry.  ?Neurological:  ?   Mental Status: She is alert and oriented to person, place, and time.  ?Psychiatric:     ?   Mood and Affect: Mood normal.     ?   Behavior: Behavior normal.  ?  ?  ?  ?   ?Assessment:  ?   ?Breast cancer, candidate for breast conservation. ?   ?Plan:  ?   ?The patient has been seen by medical oncology.  Upfront surgery and sentinel node biopsy is planned. ?  ?The patient desires to defer surgery until the first week of May due to responsibilities at work.  We discussed the statistically the increased risk of advancing disease with surgery outside 60 days from diagnosis.  Realistically, this is a tiny statistical variant. ?  ?The patient may be a candidate for elective cholecystectomy in the future based on evidence of her porcelain gallbladder.  This will be deferred till after her cancer treatment is completed. ?   ?  This note is partially prepared by Ledell Noss, CMA acting as a scribe in the presence of Dr. Hervey Ard, MD.  ?  ?The documentation recorded by the scribe accurately reflects the service I personally performed and the decisions made by me.  ?  ?Robert Bellow, MD FACS ?  ?

## 2021-12-27 NOTE — Patient Instructions (Signed)

## 2021-12-29 ENCOUNTER — Encounter: Payer: Self-pay | Admitting: Nurse Practitioner

## 2021-12-29 ENCOUNTER — Ambulatory Visit: Payer: Federal, State, Local not specified - PPO | Admitting: Nurse Practitioner

## 2021-12-29 VITALS — BP 130/82 | HR 85 | Temp 98.4°F | Ht 60.32 in | Wt 259.8 lb

## 2021-12-29 DIAGNOSIS — K760 Fatty (change of) liver, not elsewhere classified: Secondary | ICD-10-CM

## 2021-12-29 DIAGNOSIS — R0602 Shortness of breath: Secondary | ICD-10-CM

## 2021-12-29 DIAGNOSIS — J301 Allergic rhinitis due to pollen: Secondary | ICD-10-CM

## 2021-12-29 DIAGNOSIS — K828 Other specified diseases of gallbladder: Secondary | ICD-10-CM

## 2021-12-29 DIAGNOSIS — I10 Essential (primary) hypertension: Secondary | ICD-10-CM | POA: Diagnosis not present

## 2021-12-29 DIAGNOSIS — C50919 Malignant neoplasm of unspecified site of unspecified female breast: Secondary | ICD-10-CM

## 2021-12-29 DIAGNOSIS — J432 Centrilobular emphysema: Secondary | ICD-10-CM | POA: Diagnosis not present

## 2021-12-29 DIAGNOSIS — Z9049 Acquired absence of other specified parts of digestive tract: Secondary | ICD-10-CM | POA: Insufficient documentation

## 2021-12-29 NOTE — Assessment & Plan Note (Signed)
Noted on 04/19/20 lung screening CT.  Educated patient on this.  Focus on healthy diet and regular exercise. 

## 2021-12-29 NOTE — Assessment & Plan Note (Signed)
Improving with Anoro -- obtain echo to further assess, ordered in January and not yet obtained.  Continue Losartan for BP control.  Recent labs reassuring. ?

## 2021-12-29 NOTE — Assessment & Plan Note (Signed)
Possible noted on imaging 04/19/20, was to have a non emergent CT abdomen with and without IV contrast, was ordered but she did not obtain due to cost.  Has missed repeat lung screening.  At this time she wishes to focus on new breast cancer dx and will plan on repeat abdomen imaging in future.   ?

## 2021-12-29 NOTE — Assessment & Plan Note (Signed)
Chronic, ongoing with some recent exacerbation with increased pollen.  Continue current medication regimen, but discussed with her addition of Singulair if worsening symptoms continue and discussed with her use of this + BLACK BOX warning present.  She will alert provider if wishes to try this.   ?

## 2021-12-29 NOTE — Assessment & Plan Note (Signed)
Chronic, ongoing with FEV1 62% and FEV1/FVC 81% in February 2023.  Continue Anoro (which has improved SOB) + continue Albuterol as needed, discussed at length change with patient.  Continue lung screening annually, as past smoker with 30 year smoking history -- recommend she schedule this.  Refills as needed.   ?

## 2021-12-29 NOTE — Progress Notes (Signed)
? ?BP 130/82 (BP Location: Left Arm, Patient Position: Sitting, Cuff Size: Large)   Pulse 85   Temp 98.4 ?F (36.9 ?C) (Oral)   Ht 5' 0.32" (1.532 m)   Wt 259 lb 12.8 oz (117.8 kg)   LMP  (LMP Unknown)   SpO2 96%   BMI 50.20 kg/m?   ? ?Subjective:  ? ? Patient ID: Dorothy Cunningham, female    DOB: 09-21-1961, 61 y.o.   MRN: [id number] ? ?HPI: ?Dorothy Cunningham is a 61 y.o. female ? ?Chief Complaint  ?Patient presents with  ? COPD  ? Hypertension  ? Cough  ?  Patient states she is not sure if it is her allergies or the season changes, but she is noticing she is coughing more. Patient states since the change of her inhalers, she says she does not really helping or if the cough she has is coming from the way the new inhaler works. Patient states it feels like sand or something is lingering in her throat.   ? Skin Discoloration  ? ?BREAST CANCER: ?New diagnosis on 11/14/21 with invasive carcinoma left breast, no lymph node involvement noted.  Seen by oncology last on 12/11/21 with further assessment being performed to determine next steps -- having lumpectomy 01/26/22.  No current chemotherapy or treatments. ? ?COPD ?Switched to [employer] daily on 11/17/21 and Albuterol as needed.  She quit smoking 8 years ago, smoked cigarettes for 30 years -- started at age 65 -- smoked about 1 PPD.  Reports tolerating Anoro well, has noticed some improvement with SOB -- none with short walks or moving to sofa, does still have on occasion with uphill and steps.  Has not gone for echo as ordered. ? ?Has noticed a dry cough over past week, thinks it is allergies, not consistent.  Notices cough more when winded.  Not consistent.  Currently takes [employer] planner.   ?  ?CT CA screening on 04/19/20 which noted aortic atherosclerosis, centrilobular and paraseptal emphysema + 3 vessel coronary artery disease.  There was also noted to be some hepatic steatosis and possible porcelain gallbladder. Further evaluation with nonemergent  abdominal CT with and without IV contrast is recommended in the near future to better evaluate this finding.  A CT abdomen was ordered, but cost issue at that time and she did not attend.  Has not attended for repeat CT lung screening. ?COPD status: stable ?Satisfied with current treatment?: yes ?Oxygen use: no ?Dyspnea frequency: improved some ?Cough frequency: with activity ?Rescue inhaler frequency: once a week ?Limitation of activity: no ?Productive cough: none ?Last Spirometry:  11/17/21 FEV1 62% and FEV1/FVC 81% ?Pneumovax: Up To Date ?Influenza: Up to Date ? ?HYPERTENSION ?Continues on Losartan 100 MG daily, recently increased. ?Hypertension status: stable  ?Satisfied with current treatment? yes ?Duration of hypertension: chronic ?BP monitoring frequency:  not checking ?BP range:  ?BP medication side effects:  no ?Medication compliance: good compliance ?Previous BP meds: as above ?Aspirin: no ?Recurrent headaches: no ?Visual changes: no ?Palpitations: no ?Dyspnea: no ?Chest pain: no ?Lower extremity edema: no ?Dizzy/lightheaded: no  ? ?Relevant past medical, surgical, family and social history reviewed and updated as indicated. Interim medical history since our last visit reviewed. ?Allergies and medications reviewed and updated. ? ?Review of Systems  ?Constitutional:  Negative for activity change, appetite change, diaphoresis, fatigue and fever.  ?Respiratory:  Positive for shortness of breath (improving). Negative for cough, chest tightness and wheezing.   ?Cardiovascular:  Negative for chest pain, palpitations and  leg swelling.  ?Gastrointestinal: Negative.   ?Neurological: Negative.   ?Psychiatric/Behavioral: Negative.    ? ?Per HPI unless specifically indicated above ? ?   ?Objective:  ?  ?BP 130/82 (BP Location: Left Arm, Patient Position: Sitting, Cuff Size: Large)   Pulse 85   Temp 98.4 ?F (36.9 ?C) (Oral)   Ht 5' 0.32" (1.532 m)   Wt 259 lb 12.8 oz (117.8 kg)   LMP  (LMP Unknown)   SpO2 96%    BMI 50.20 kg/m?   ?Wt Readings from Last 3 Encounters:  ?12/29/21 259 lb 12.8 oz (117.8 kg)  ?11/17/21 253 lb 6.4 oz (114.9 kg)  ?11/14/21 258 lb (117 kg)  ?  ?Physical Exam ?Vitals and nursing note reviewed.  ?Constitutional:   ?   General: She is awake. She is not in acute distress. ?   Appearance: She is well-developed and well-groomed. She is morbidly obese. She is not ill-appearing.  ?HENT:  ?   Head: Normocephalic.  ?   Right Ear: Hearing normal.  ?   Left Ear: Hearing normal.  ?Eyes:  ?   General: Lids are normal.     ?   Right eye: No discharge.     ?   Left eye: No discharge.  ?   Conjunctiva/sclera: Conjunctivae normal.  ?   Pupils: Pupils are equal, round, and reactive to light.  ?Neck:  ?   Thyroid: No thyromegaly.  ?   Vascular: No carotid bruit.  ?Cardiovascular:  ?   Rate and Rhythm: Normal rate and regular rhythm.  ?   Heart sounds: Normal heart sounds. No murmur heard. ?  No gallop.  ?Pulmonary:  ?   Effort: Pulmonary effort is normal. No accessory muscle usage or respiratory distress.  ?   Breath sounds: Normal breath sounds.  ?Abdominal:  ?   General: Bowel sounds are normal.  ?   Palpations: Abdomen is soft.  ?Musculoskeletal:  ?   Cervical back: Normal range of motion and neck supple.  ?   Right lower leg: No edema.  ?   Left lower leg: No edema.  ?Skin: ?   General: Skin is warm and dry.  ?Neurological:  ?   Mental Status: She is alert and oriented to person, place, and time.  ?Psychiatric:     ?   Attention and Perception: Attention normal.     ?   Mood and Affect: Mood normal.     ?   Speech: Speech normal.     ?   Behavior: Behavior normal. Behavior is cooperative.     ?   Thought Content: Thought content normal.  ? ?Results for orders placed or performed in visit on 11/17/21  ?Basic metabolic panel  ?Result Value Ref Range  ? Glucose 89 70 - 99 mg/dL  ? BUN 10 8 - 27 mg/dL  ? Creatinine, Ser 1.18 (H) 0.57 - 1.00 mg/dL  ? eGFR 52 (L) >59 mL/min/1.73  ? BUN/Creatinine Ratio 8 (L) 12 - 28  ?  Sodium 141 134 - 144 mmol/L  ? Potassium 3.7 3.5 - 5.2 mmol/L  ? Chloride 102 96 - 106 mmol/L  ? CO2 24 20 - 29 mmol/L  ? Calcium 10.1 8.7 - 10.3 mg/dL  ?Microalbumin, Urine Waived  ?Result Value Ref Range  ? Microalb, Ur Waived 80 (H) 0 - 19 mg/L  ? Creatinine, Urine Waived 300 10 - 300 mg/dL  ? Microalb/Creat Ratio 30-300 (H) <30 mg/g  ? ?   ?  Assessment & Plan:  ? ?Problem List Items Addressed This Visit   ? ?  ? Cardiovascular and Mediastinum  ? Essential hypertension  ?  Chronic and improved with Losartan on board.  Educated on [place] and recommend checking BP at home daily and documenting for provider visits. Continue Losartan daily, which is beneficial for proteinuria (80) as well.  Avoid ACE due to underlying lung disease.  ?  ?  ?  ? Respiratory  ? Allergic rhinitis  ?  Chronic, ongoing with some recent exacerbation with increased pollen.  Continue current medication regimen, but discussed with her addition of Singulair if worsening symptoms continue and discussed with her use of this + BLACK BOX warning present.  She will alert provider if wishes to try this.   ?  ?  ? Centrilobular emphysema ([place])  ?  Chronic, ongoing with FEV1 62% and FEV1/FVC 81% in February 2023.  Continue Anoro (which has improved SOB) + continue Albuterol as needed, discussed at length change with patient.  Continue lung screening annually, as past smoker with 30 year smoking history -- recommend she schedule this.  Refills as needed.   ?  ?  ?  ? Digestive  ? Hepatic steatosis  ?  Noted on 04/19/20 lung screening CT.  Educated patient on this.  Focus on healthy diet and regular exercise. ?  ?  ? Porcelain gallbladder  ?  Possible noted on imaging 04/19/20, was to have a non emergent CT abdomen with and without IV contrast, was ordered but she did not obtain due to cost.  Has missed repeat lung screening.  At this time she wishes to focus on new breast cancer dx and will plan on repeat abdomen imaging in future.   ?  ?  ?  ? Other  ?  Invasive carcinoma of breast ([place]) - Primary  ?  Continue collaboration with oncology at this time.  Recent notes reviewed. ?  ?  ? Shortness of breath  ?  Improving with Anoro -- obtain echo to further assess, ordered in

## 2021-12-29 NOTE — Assessment & Plan Note (Signed)
Chronic and improved with Losartan on board.  Educated on [place] and recommend checking BP at home daily and documenting for provider visits. Continue Losartan daily, which is beneficial for proteinuria (80) as well.  Avoid ACE due to underlying lung disease.  ?

## 2021-12-29 NOTE — Assessment & Plan Note (Signed)
Continue collaboration with oncology at this time.  Recent notes reviewed. 

## 2021-12-31 ENCOUNTER — Other Ambulatory Visit: Payer: Self-pay | Admitting: Nurse Practitioner

## 2022-01-01 ENCOUNTER — Ambulatory Visit: Admission: RE | Admit: 2022-01-01 | Payer: Federal, State, Local not specified - PPO | Source: Ambulatory Visit

## 2022-01-01 NOTE — Telephone Encounter (Signed)
Requested Prescriptions  ?Pending Prescriptions Disp Refills  ?? atorvastatin (LIPITOR) 20 MG tablet [Pharmacy Med Name: ATORVASTATIN '20MG'$  TABLETS] 90 tablet 2  ?  Sig: TAKE 1 TABLET(20 MG) BY MOUTH DAILY  ?  ? Cardiovascular:  Antilipid - Statins Failed - 12/31/2021  8:09 AM  ?  ?  Failed - Lipid Panel in normal range within the last 12 months  ?  Cholesterol, Total  ?Date Value Ref Range Status  ?10/20/2021 138 100 - 199 mg/dL Final  ? ?Cholesterol Piccolo, [place]  ?Date Value Ref Range Status  ?10/20/2019 166 <200 mg/dL Final  ?  Comment:  ?                          Desirable                <200 ?                        Borderline High      200- 239 ?                        High                     >239 ?  ? ?LDL Chol Calc (NIH)  ?Date Value Ref Range Status  ?10/20/2021 65 0 - 99 mg/dL Final  ? ?HDL  ?Date Value Ref Range Status  ?10/20/2021 39 (L) >39 mg/dL Final  ? ?Triglycerides  ?Date Value Ref Range Status  ?10/20/2021 203 (H) 0 - 149 mg/dL Final  ? ?Triglycerides Piccolo,Waived  ?Date Value Ref Range Status  ?10/20/2019 276 (H) <150 mg/dL Final  ?  Comment:  ?                          Normal                   <150 ?                        Borderline High     150 - 199 ?                        High                200 - 499 ?                        Very High                >499 ?  ? ?  ?  ?  Passed - Patient is not pregnant  ?  ?  Passed - Valid encounter within last 12 months  ?  Recent Outpatient Visits   ?      ? 3 days ago Invasive carcinoma of breast ([place])  ? [place], [place] T, NP  ? 1 month ago Invasive carcinoma of breast ([place])  ? [place], [place] T, NP  ? 2 months ago Centrilobular emphysema ([place])  ? [facility] [place], [place] T, NP  ? 8 months ago COVID-19  ? [facility] [place], [place], MD  ? 1 year ago Centrilobular emphysema ([place])  ? [facility] [place], Henrine Screws T, NP  ?  ?  ?  Future Appointments   ?        ? In 4  months Cannady, Barbaraann Faster, NP [employer]  ?  ? ?  ?  ?  ? ?

## 2022-01-14 ENCOUNTER — Inpatient Hospital Stay: Payer: Federal, State, Local not specified - PPO | Admitting: Occupational Therapy

## 2022-01-14 ENCOUNTER — Inpatient Hospital Stay: Payer: Federal, State, Local not specified - PPO

## 2022-01-14 ENCOUNTER — Inpatient Hospital Stay: Payer: Federal, State, Local not specified - PPO | Attending: Oncology | Admitting: Licensed Clinical Social Worker

## 2022-01-14 DIAGNOSIS — E785 Hyperlipidemia, unspecified: Secondary | ICD-10-CM | POA: Insufficient documentation

## 2022-01-14 DIAGNOSIS — Z803 Family history of malignant neoplasm of breast: Secondary | ICD-10-CM | POA: Diagnosis not present

## 2022-01-14 DIAGNOSIS — C50919 Malignant neoplasm of unspecified site of unspecified female breast: Secondary | ICD-10-CM

## 2022-01-14 DIAGNOSIS — Z79899 Other long term (current) drug therapy: Secondary | ICD-10-CM | POA: Insufficient documentation

## 2022-01-14 DIAGNOSIS — I10 Essential (primary) hypertension: Secondary | ICD-10-CM | POA: Insufficient documentation

## 2022-01-14 DIAGNOSIS — Z8349 Family history of other endocrine, nutritional and metabolic diseases: Secondary | ICD-10-CM | POA: Insufficient documentation

## 2022-01-14 DIAGNOSIS — Z87891 Personal history of nicotine dependence: Secondary | ICD-10-CM | POA: Insufficient documentation

## 2022-01-14 DIAGNOSIS — Z170 Estrogen receptor positive status [ER+]: Secondary | ICD-10-CM | POA: Insufficient documentation

## 2022-01-14 DIAGNOSIS — C50512 Malignant neoplasm of lower-outer quadrant of left female breast: Secondary | ICD-10-CM | POA: Insufficient documentation

## 2022-01-14 DIAGNOSIS — Z836 Family history of other diseases of the respiratory system: Secondary | ICD-10-CM | POA: Insufficient documentation

## 2022-01-14 DIAGNOSIS — Z833 Family history of diabetes mellitus: Secondary | ICD-10-CM | POA: Insufficient documentation

## 2022-01-14 DIAGNOSIS — Z8249 Family history of ischemic heart disease and other diseases of the circulatory system: Secondary | ICD-10-CM | POA: Insufficient documentation

## 2022-01-14 DIAGNOSIS — G4730 Sleep apnea, unspecified: Secondary | ICD-10-CM | POA: Insufficient documentation

## 2022-01-14 NOTE — Therapy (Signed)
[place] ?[facility] ?[address], [address] ?[place], [place], [address] ?Phone: [phone]   Fax:  [phone] ? ?Occupational Therapy Screen: ? ?Patient Details  ?Name: Dorothy Cunningham ?MRN: [id number] ?Date of Birth: [birth year] ?No data recorded ? ?Encounter Date: 01/14/2022 ? ? OT End of Session - 01/14/22 1003   ? ? Visit Number 0   ? ?  ?  ? ?  ? ? ?Past Medical History:  ?Diagnosis Date  ? Allergic rhinitis   ? Anxiety   ? COPD (chronic obstructive pulmonary disease) ([place])   ? Depression   ? History of epilepsy   ? as a teenager  ? Hyperlipidemia   ? Sleep apnea   ? ? ?Past Surgical History:  ?Procedure Laterality Date  ? Bladder Tuck  1995  ? BREAST BIOPSY Left 2014  ? benign  ? BREAST BIOPSY Left 11/06/2021  ? u/s bx  5 o'clock, VENUS clip- INVASIVE MAMMARY CARCINOMA,  ? PARTIAL HYSTERECTOMY  1995  ? due to heavy periods  ? ? ?There were no vitals filed for this visit. ? ? Subjective Assessment - 01/14/22 1001   ? ? Subjective  I am doing okay I am having my surgery May 1st -and then  they will decide if I need any more treatment   ? Currently in Pain? No/denies   ? ?  ?  ? ?  ? ? ?DR Bary Castilla 12/11/21:  ?Breast cancer, candidate for breast conservation. ?   ?Plan:  ?   ?The patient has been seen by medical oncology.  Upfront surgery and sentinel node biopsy is planned. May 1st lumpectomy schedule ? ?OT SCREEN 01/14/22: ?Patient was referred by [facility] n meeting for education of signs and symptoms for lymphedema as well as prevention. ?Patient was educated on home exercise program for range of motion, scar massage after surgery when okayed by surgeon. ?Handout was provided that was in the cancer journal about lymphedema signs and symptoms and prevention. ?Patient and husband verbalized understanding ?Patient scheduled for May 1st for lumpectomy on the left side ?Patient is right-handed and works from home on the computer.  Likes to work in the yard ?Patient to contact  me as needed after surgery. ? ? ? ? ? ? ? ? ? ? ? ? ? ? ? ? ? ? ? ? ? ? ? ? ? ? ? ? ? ? ? ?  ?  ?  ? ? ?Visit Diagnosis: ?Invasive carcinoma of breast ([place]) ? ? ? ?Problem List ?Patient Active Problem List  ? Diagnosis Date Noted  ? Porcelain gallbladder 12/29/2021  ? Other proteinuria 11/17/2021  ? Invasive carcinoma of breast ([place]) 11/14/2021  ? Goals of care, counseling/discussion 11/14/2021  ? Shortness of breath 10/20/2021  ? Aortic atherosclerosis ([place]) 05/23/2020  ? Hepatic steatosis 05/23/2020  ? Genetic disorder 05/23/2020  ? BMI 50.0-59.9, adult ([place]) 04/03/2020  ? History of smoking 04/03/2020  ? Morbid obesity ([place]) 12/02/2018  ? Essential hypertension 12/02/2018  ? Stage 3a chronic kidney disease ([place]) 12/01/2018  ? Vitamin D deficiency 05/23/2018  ? Depression   ? Allergic rhinitis   ? Centrilobular emphysema ([place])   ? Hyperlipidemia   ? Sleep apnea   ? ? ?Rosalyn Gess, OTR/L,CLT ?01/14/2022, 10:06 AM ? ?[place] ?[facility] ?[address], [address] ?[place], [place], [address] ?Phone: [phone]   Fax:  [phone] ? ?Name: Dorothy Cunningham ?MRN: [id number] ?Date of Birth: [birth year] ? ?

## 2022-01-14 NOTE — Progress Notes (Signed)
REFERRING PROVIDER: ?Earlie Server, MD ?[address],  [place] [address] ? ?PRIMARY PROVIDER:  ?Venita Lick, NP ? ?PRIMARY REASON FOR VISIT:  ?1. Invasive carcinoma of breast ([place])   ?2. Family history of breast cancer   ? ? ? ?HISTORY OF PRESENT ILLNESS:   ?Ms. Deininger, a 61 y.o. female, was seen for a [place] cancer genetics consultation at the request of Dr. Tasia Catchings due to a personal and family history of cancer.  Ms. Rudden presents to clinic today to discuss the possibility of a hereditary predisposition to cancer, genetic testing, and to further clarify her future cancer risks, as well as potential cancer risks for family members.  ? ?In 2023, at the age of 17, Ms. Luhmann was diagnosed with invasive mammary carcinoma of the left breast. The treatment plan includes lumpectomy, scheduled for 5/1. Patient would not change surgical decision based on genetic testing.  ? ? ?CANCER HISTORY:  ?Oncology History  ?Invasive carcinoma of breast ([place])  ?11/14/2021 Initial Diagnosis  ? Invasive carcinoma of breast ([place]) ? ?  ?11/14/2021 Cancer Staging  ? Staging form: Breast, AJCC 8th Edition ?- Clinical stage from 11/14/2021: cT2, cN0, cM0, G3, ER: Unknown, PR: Unknown, HER2: Unknown - Signed by Earlie Server, MD on 11/14/2021 ?Stage prefix: Initial diagnosis ?Histologic grading system: 3 grade system ? ?  ? ? ? ?RISK FACTORS:  ?Menarche was at age 67.  ?First live birth at age 58.  ?OCP use for approximately 3 years.  ?Ovaries intact: yes.  ?Hysterectomy: yes.  ?Menopausal status: postmenopausal.  ?HRT use: 0 years. ?Colonoscopy: yes; normal. ? ?Past Medical History:  ?Diagnosis Date  ? Allergic rhinitis   ? Anxiety   ? COPD (chronic obstructive pulmonary disease) ([place])   ? Depression   ? History of epilepsy   ? as a teenager  ? Hyperlipidemia   ? Sleep apnea   ? ? ?Past Surgical History:  ?Procedure Laterality Date  ? Bladder Tuck  1995  ? BREAST BIOPSY Left 2014  ? benign  ? BREAST BIOPSY Left 11/06/2021  ? u/s  bx  5 o'clock, VENUS clip- INVASIVE MAMMARY CARCINOMA,  ? PARTIAL HYSTERECTOMY  1995  ? due to heavy periods  ? ? ?Social History  ? ?Socioeconomic History  ? Marital status: Married  ?  Spouse name: Not on file  ? Number of children: Not on file  ? Years of education: Not on file  ? Highest education level: Not on file  ?Occupational History  ? Not on file  ?Tobacco Use  ? Smoking status: Former  ?  Packs/day: 0.75  ?  Years: 40.00  ?  Pack years: 30.00  ?  Types: Cigarettes  ?  Quit date: 09/28/2014  ?  Years since quitting: 7.3  ? Smokeless tobacco: Never  ?Vaping Use  ? Vaping Use: Former  ?Substance and Sexual Activity  ? Alcohol use: No  ? Drug use: No  ? Sexual activity: Never  ?Other Topics Concern  ? Not on file  ?Social History Narrative  ? Not on file  ? ?Social Determinants of Health  ? ?Financial Resource Strain: Low Risk   ? Difficulty of Paying Living Expenses: Not hard at all  ?Food Insecurity: No Food Insecurity  ? Worried About Charity fundraiser in the Last Year: Never true  ? Ran Out of Food in the Last Year: Never true  ?Transportation Needs: No Transportation Needs  ? Lack of Transportation (Medical): No  ?  Lack of Transportation (Non-Medical): No  ?Physical Activity: Inactive  ? Days of Exercise per Week: 0 days  ? Minutes of Exercise per Session: 0 min  ?Stress: Stress Concern Present  ? Feeling of Stress : To some extent  ?Social Connections: Moderately Integrated  ? Frequency of Communication with Friends and Family: Twice a week  ? Frequency of Social Gatherings with Friends and Family: Once a week  ? Attends Religious Services: 1 to 4 times per year  ? Active Member of Clubs or Organizations: No  ? Attends Archivist Meetings: Never  ? Marital Status: Married  ?  ? ?FAMILY HISTORY:  ?We obtained a detailed, 4-generation family history.  Significant diagnoses are listed below: ?Family History  ?Problem Relation Age of Onset  ? Thyroid disease Mother   ? Asthma Mother   ?  Osteoarthritis Father   ? CAD Father   ? Diabetes Maternal Uncle   ? Diabetes Maternal Grandfather   ? Tuberculosis Maternal Grandfather   ? Diabetes Paternal Grandmother   ? Heart attack Paternal Grandfather   ? Breast cancer Other   ?     great grandmother  ? ?Ms. Edick has 3 daughters (27, 81, 69). They  have not had cancer. She does not have siblings. ? ?Ms. Mccowan's mother is living at 28 and has had benign breast cysts, no history of cancer. Patient had 1 maternal aunt, 1 maternal uncle, no cancer history. Maternal grandmother died at 29 with no history of cancer, but her mother did have breast cancer and passed at 60. Maternal grandfather died at 56. ? ?Ms. Coiro's father is living at 57. Patient had 2 paternal aunts, 2 paternal uncles. One aunt died from cancer recently, unknown type. Paternal grandparents did not have cancer. ? ?Ms. Meditz is unaware of previous family history of genetic testing for hereditary cancer risks. Patient's maternal ancestors are of Irish/European descent, and paternal ancestors are of European descent. There is no reported Ashkenazi Jewish ancestry. There is no known consanguinity. ? ? ? ?GENETIC COUNSELING ASSESSMENT: Ms. Vollmer is a 61 y.o. female with a personal and family history of breast cancer which is somewhat suggestive of a hereditary cancer syndrome and predisposition to cancer. We, therefore, discussed and recommended the following at today's visit.  ? ?DISCUSSION: We discussed that approximately 10% of breast cancer is hereditary. Most cases of hereditary breast cancer are associated with BRCA1/BRCA2 genes, although there are other genes associated with hereditary cancer as well. Cancers and risks are gene specific. We discussed that testing is beneficial for several reasons including knowing about cancer risks, identifying potential screening and risk-reduction options that may be appropriate, and to understand if other family members could be at risk  for cancer and allow them to undergo genetic testing.  ? ?We reviewed the characteristics, features and inheritance patterns of hereditary cancer syndromes. We also discussed genetic testing, including the appropriate family members to test, the process of testing, insurance coverage and turn-around-time for results. We discussed the implications of a negative, positive and/or variant of uncertain significant result. We recommended Ms. Rausch pursue genetic testing for the [facility] CancerNext-Expanded+RNA gene panel.  ? ?Based on Ms. Wasser's personal and family history of cancer, she meets medical criteria for genetic testing. Despite that she meets criteria, she may still have an out of pocket cost. We discussed that if her out of pocket cost for testing is over $100, the laboratory will call and confirm whether she wants to proceed with  testing.  If the out of pocket cost of testing is less than $100 she will be billed by the genetic testing laboratory.  ? ?PLAN: After considering the risks, benefits, and limitations, Ms. Emond provided informed consent to pursue genetic testing and the blood sample was sent to [facility] for analysis of the CancerNext-Expanded+RNA panel. Results should be available within approximately 2-3 weeks' time, at which point they will be disclosed by telephone to Ms. Fortune, as will any additional recommendations warranted by these results. Ms. Stilley will receive a summary of her genetic counseling visit and a copy of her results once available. This information will also be available in Epic.  ? ?Ms. Fedak's questions were answered to her satisfaction today. Our contact information was provided should additional questions or concerns arise. Thank you for the referral and allowing [place] to share in the care of your patient.  ? ?Faith Rogue, MS, LCGC ?Genetic Counselor ?[email or web address] ?Phone: [phone] ? ?The patient was seen for a total of 30  minutes in face-to-face genetic counseling. Patient was seen with her husband, [name].  Dr. Grayland Ormond was available for discussion regarding this case.  ? ?______________________________________________________________

## 2022-01-19 ENCOUNTER — Encounter
Admission: RE | Admit: 2022-01-19 | Discharge: 2022-01-19 | Disposition: A | Discharge status: Home / Self Care | Payer: Federal, State, Local not specified - PPO | Source: Ambulatory Visit | Attending: General Surgery | Admitting: General Surgery

## 2022-01-19 ENCOUNTER — Other Ambulatory Visit: Payer: Self-pay

## 2022-01-19 HISTORY — DX: Essential (primary) hypertension: I10

## 2022-01-19 NOTE — Patient Instructions (Addendum)
Your procedure is scheduled on: Monday 01/26/22 ?Report to the Registration Desk on the 1st floor of the [facility]. ?To find out your arrival time, please call [phone] between 1PM - 3PM on: Friday 01/23/22 ? ?REMEMBER: ?Instructions that are not followed completely may result in serious medical risk, up to and including death; or upon the discretion of your surgeon and anesthesiologist your surgery may need to be rescheduled. ? ?Do not eat food after midnight the night before surgery.  ?No gum chewing, lozengers or hard candies. ? ?You may however, drink CLEAR liquids up to 2 hours before you are scheduled to arrive for your surgery. Do not drink anything within 2 hours of your scheduled arrival time. ? ?Clear liquids include: ?- water  ?- apple juice without pulp ?- gatorade (not RED colors) ?- black coffee or tea (Do NOT add milk or creamers to the coffee or tea) ?Do NOT drink anything that is not on this list. ? ?TAKE THESE MEDICATIONS THE MORNING OF SURGERY WITH A SIP OF WATER: ?atorvastatin (LIPITOR) 20 MG tablet ?buPROPion (WELLBUTRIN XL) 300 MG 24 hr tablet ? ?Use your umeclidinium-vilanterol (ANORO ELLIPTA) 62.5-25 MCG/ACT AEPB, albuterol (VENTOLIN HFA) 108 (90 Base) MCG/ACT inhaler on the day of surgery and bring the albuterol to the hospital. ? ?Hold Aspirin on the day of surgery per Dr. Bary Castilla. ? ?One week prior to surgery: ?Stop Anti-inflammatories (NSAIDS) such as Advil, Aleve, Ibuprofen, Motrin, Naproxen, Naprosyn and Aspirin based products such as Excedrin, Goodys Powder, BC Powder. ? ?Stop taking your b complex vitamins capsule, Cholecalciferol (VITAMIN D) 50 MCG (9371 UT) CAPS, Garlic 10 MG CAPS, Glucosamine-Chondroit-Vit C-Mn (GLUCOSAMINE CHONDR 500 COMPLEX) CAPS, Multiple Vitamin (MULTIVITAMIN) tablet, Omega-3 Fatty Acids (FISH OIL PO), and ANY OVER THE COUNTER supplements until after surgery. ? ?You may however, continue to take Tylenol if needed for pain up until the day of  surgery. ? ?No Alcohol for 24 hours before or after surgery. ? ?No Smoking including e-cigarettes for 24 hours prior to surgery.  ?No chewable tobacco products for at least 6 hours prior to surgery.  ?No nicotine patches on the day of surgery. ? ?Do not use any "recreational" drugs for at least a week prior to your surgery.  ?Please be advised that the combination of cocaine and anesthesia may have negative outcomes, up to and including death. ?If you test positive for cocaine, your surgery will be cancelled. ? ?On the morning of surgery brush your teeth with toothpaste and water, you may rinse your mouth with mouthwash if you wish. ?Do not swallow any toothpaste or mouthwash. ? ?Use CHG wipes as directed on instruction sheet. ? ?Patient was instructed to apply Lidocaine and saran wrap 1 hour prior to arrival for surgery per Dr. Bary Castilla. ? ?Do not wear jewelry, make-up, hairpins, clips or nail polish. ? ?Do not wear lotions, powders, or perfumes.  ? ?Do not shave body from the neck down 48 hours prior to surgery just in case you cut yourself which could leave a site for infection.  ?Also, freshly shaved skin may become irritated if using the CHG soap. ? ?Do not bring valuables to the hospital. [facility] is not responsible for any missing/lost belongings or valuables.  ? ?Notify your doctor if there is any change in your medical condition (cold, fever, infection). ? ?Wear comfortable clothing (specific to your surgery type) to the hospital. ? ?After surgery, you can help prevent lung complications by doing breathing exercises.  ?Take deep breaths and  cough every 1-2 hours.  ? ?If you are being discharged the day of surgery, you will not be allowed to drive home. ?You will need a responsible adult (18 years or older) to drive you home and stay with you that night.  ? ?If you are taking public transportation, you will need to have a responsible adult (18 years or older) with you. ?Please confirm with your physician  that it is acceptable to use public transportation.  ? ?Please call the [facility] Dept. at [phone] if you have any questions about these instructions. ? ?Surgery Visitation Policy: ? ?Patients undergoing a surgery or procedure may have two family members or support persons with them as long as the person is not COVID-19 positive or experiencing its symptoms.  ? ?Inpatient Visitation:   ? ?Visiting hours are 7 a.m. to 8 p.m. ?Up to four visitors are allowed at one time in a patient room, including children. The visitors may rotate out with other people during the day. One designated support person (adult) may remain overnight.  ?

## 2022-01-23 ENCOUNTER — Other Ambulatory Visit
Admission: RE | Admit: 2022-01-23 | Discharge: 2022-01-23 | Disposition: A | Discharge status: Home / Self Care | Payer: Federal, State, Local not specified - PPO | Source: Ambulatory Visit | Attending: General Surgery | Admitting: General Surgery

## 2022-01-23 ENCOUNTER — Other Ambulatory Visit: Payer: Self-pay | Admitting: Radiology

## 2022-01-23 DIAGNOSIS — Z6841 Body Mass Index (BMI) 40.0 and over, adult: Secondary | ICD-10-CM | POA: Diagnosis not present

## 2022-01-23 DIAGNOSIS — J449 Chronic obstructive pulmonary disease, unspecified: Secondary | ICD-10-CM | POA: Diagnosis not present

## 2022-01-23 DIAGNOSIS — Z87891 Personal history of nicotine dependence: Secondary | ICD-10-CM | POA: Diagnosis not present

## 2022-01-23 DIAGNOSIS — D0512 Intraductal carcinoma in situ of left breast: Secondary | ICD-10-CM | POA: Diagnosis not present

## 2022-01-23 DIAGNOSIS — C50512 Malignant neoplasm of lower-outer quadrant of left female breast: Secondary | ICD-10-CM | POA: Diagnosis not present

## 2022-01-23 DIAGNOSIS — C50912 Malignant neoplasm of unspecified site of left female breast: Secondary | ICD-10-CM | POA: Diagnosis not present

## 2022-01-23 DIAGNOSIS — F419 Anxiety disorder, unspecified: Secondary | ICD-10-CM | POA: Diagnosis not present

## 2022-01-23 DIAGNOSIS — G4730 Sleep apnea, unspecified: Secondary | ICD-10-CM | POA: Diagnosis not present

## 2022-01-23 DIAGNOSIS — Z01818 Encounter for other preprocedural examination: Secondary | ICD-10-CM | POA: Diagnosis not present

## 2022-01-23 DIAGNOSIS — F32A Depression, unspecified: Secondary | ICD-10-CM | POA: Diagnosis not present

## 2022-01-23 DIAGNOSIS — Z01810 Encounter for preprocedural cardiovascular examination: Secondary | ICD-10-CM | POA: Insufficient documentation

## 2022-01-23 DIAGNOSIS — I10 Essential (primary) hypertension: Secondary | ICD-10-CM | POA: Diagnosis not present

## 2022-01-25 MED ORDER — CHLORHEXIDINE GLUCONATE 0.12 % MT SOLN
15.0000 mL | Freq: Once | OROMUCOSAL | Status: AC
Start: 2022-01-25 — End: 2022-01-26

## 2022-01-25 MED ORDER — FAMOTIDINE 20 MG PO TABS
20.0000 mg | ORAL_TABLET | Freq: Once | ORAL | Status: AC
Start: 2022-01-25 — End: 2022-01-26

## 2022-01-25 MED ORDER — ORAL CARE MOUTH RINSE: 15.0000 mL | Freq: Once | OROMUCOSAL | Status: AC

## 2022-01-25 MED ORDER — CHLORHEXIDINE GLUCONATE CLOTH 2 % EX PADS
6.0000 | MEDICATED_PAD | Freq: Once | CUTANEOUS | Status: AC
  Administered 2022-01-25: 6 via TOPICAL

## 2022-01-25 MED ORDER — LACTATED RINGERS IV SOLN
INTRAVENOUS | Status: DC
Start: 2022-01-25 — End: 2022-01-26

## 2022-01-25 MED ORDER — CHLORHEXIDINE GLUCONATE CLOTH 2 % EX PADS
6.0000 | MEDICATED_PAD | Freq: Once | CUTANEOUS | Status: AC
  Administered 2022-01-26: 6 via TOPICAL

## 2022-01-26 ENCOUNTER — Other Ambulatory Visit: Payer: Self-pay

## 2022-01-26 ENCOUNTER — Ambulatory Visit
Admission: RE | Admit: 2022-01-26 | Discharge: 2022-01-26 | Disposition: A | Discharge status: Home / Self Care | Payer: Federal, State, Local not specified - PPO | Attending: General Surgery | Admitting: General Surgery

## 2022-01-26 ENCOUNTER — Ambulatory Visit
Admission: RE | Admit: 2022-01-26 | Discharge: 2022-01-26 | Disposition: A | Discharge status: Home / Self Care | Payer: Federal, State, Local not specified - PPO | Source: Home / Self Care | Attending: General Surgery | Admitting: General Surgery

## 2022-01-26 ENCOUNTER — Ambulatory Visit
Admission: RE | Admit: 2022-01-26 | Discharge: 2022-01-26 | Disposition: A | Discharge status: Home / Self Care | Payer: Federal, State, Local not specified - PPO | Source: Ambulatory Visit | Attending: General Surgery | Admitting: General Surgery

## 2022-01-26 ENCOUNTER — Ambulatory Visit: Payer: Federal, State, Local not specified - PPO | Admitting: Anesthesiology

## 2022-01-26 ENCOUNTER — Encounter: Payer: Self-pay | Admitting: General Surgery

## 2022-01-26 ENCOUNTER — Encounter
Admission: RE | Disposition: A | Discharge status: Home / Self Care | Payer: Self-pay | Source: Home / Self Care | Attending: General Surgery

## 2022-01-26 DIAGNOSIS — F32A Depression, unspecified: Secondary | ICD-10-CM | POA: Diagnosis not present

## 2022-01-26 DIAGNOSIS — Z6841 Body Mass Index (BMI) 40.0 and over, adult: Secondary | ICD-10-CM | POA: Diagnosis not present

## 2022-01-26 DIAGNOSIS — C50912 Malignant neoplasm of unspecified site of left female breast: Secondary | ICD-10-CM | POA: Diagnosis not present

## 2022-01-26 DIAGNOSIS — G4730 Sleep apnea, unspecified: Secondary | ICD-10-CM | POA: Insufficient documentation

## 2022-01-26 DIAGNOSIS — F419 Anxiety disorder, unspecified: Secondary | ICD-10-CM | POA: Insufficient documentation

## 2022-01-26 DIAGNOSIS — I10 Essential (primary) hypertension: Secondary | ICD-10-CM | POA: Diagnosis not present

## 2022-01-26 DIAGNOSIS — D0512 Intraductal carcinoma in situ of left breast: Secondary | ICD-10-CM | POA: Diagnosis not present

## 2022-01-26 DIAGNOSIS — C50919 Malignant neoplasm of unspecified site of unspecified female breast: Secondary | ICD-10-CM

## 2022-01-26 DIAGNOSIS — Z87891 Personal history of nicotine dependence: Secondary | ICD-10-CM | POA: Diagnosis not present

## 2022-01-26 DIAGNOSIS — C50512 Malignant neoplasm of lower-outer quadrant of left female breast: Secondary | ICD-10-CM | POA: Diagnosis not present

## 2022-01-26 DIAGNOSIS — J449 Chronic obstructive pulmonary disease, unspecified: Secondary | ICD-10-CM | POA: Diagnosis not present

## 2022-01-26 HISTORY — PX: BREAST LUMPECTOMY WITH SENTINEL LYMPH NODE BIOPSY: SHX5597

## 2022-01-26 SURGERY — BREAST LUMPECTOMY WITH SENTINEL LYMPH NODE BX
Anesthesia: General | Laterality: Left

## 2022-01-26 MED ORDER — BUPIVACAINE-EPINEPHRINE (PF) 0.5% -1:200000 IJ SOLN
INTRAMUSCULAR | Status: AC
  Filled 2022-01-26: qty 30

## 2022-01-26 MED ORDER — OXYCODONE HCL 5 MG PO TABS
ORAL_TABLET | ORAL | Status: AC
  Filled 2022-01-26: qty 1

## 2022-01-26 MED ORDER — BUPIVACAINE-EPINEPHRINE (PF) 0.5% -1:200000 IJ SOLN
INTRAMUSCULAR | Status: DC | PRN
  Administered 2022-01-26: 20 mL
  Administered 2022-01-26: 10 mL

## 2022-01-26 MED ORDER — TECHNETIUM TC 99M TILMANOCEPT KIT
1.1000 | PACK | Freq: Once | INTRAVENOUS | Status: AC | PRN
  Administered 2022-01-26: 1.1 via INTRADERMAL

## 2022-01-26 MED ORDER — PROPOFOL 10 MG/ML IV BOLUS
INTRAVENOUS | Status: AC
  Filled 2022-01-26: qty 20

## 2022-01-26 MED ORDER — DEXAMETHASONE SODIUM PHOSPHATE 10 MG/ML IJ SOLN
INTRAMUSCULAR | Status: DC | PRN
Start: 2022-01-26 — End: 2022-01-26
  Administered 2022-01-26: 8 mg via INTRAVENOUS

## 2022-01-26 MED ORDER — MIDAZOLAM HCL 2 MG/2ML IJ SOLN
INTRAMUSCULAR | Status: DC | PRN
  Administered 2022-01-26: 2 mg via INTRAVENOUS

## 2022-01-26 MED ORDER — GLYCOPYRROLATE 0.2 MG/ML IJ SOLN
INTRAMUSCULAR | Status: DC | PRN
Start: 2022-01-26 — End: 2022-01-26
  Administered 2022-01-26: .2 mg via INTRAVENOUS

## 2022-01-26 MED ORDER — ONDANSETRON HCL 4 MG/2ML IJ SOLN
INTRAMUSCULAR | Status: DC | PRN
Start: 2022-01-26 — End: 2022-01-26
  Administered 2022-01-26: 4 mg via INTRAVENOUS

## 2022-01-26 MED ORDER — DEXMEDETOMIDINE (PRECEDEX) IN NS 20 MCG/5ML (4 MCG/ML) IV SYRINGE
PREFILLED_SYRINGE | INTRAVENOUS | Status: DC | PRN
  Administered 2022-01-26: 8 ug via INTRAVENOUS
  Administered 2022-01-26: 12 ug via INTRAVENOUS

## 2022-01-26 MED ORDER — ACETAMINOPHEN 10 MG/ML IV SOLN: 1000.0000 mg | Freq: Once | INTRAVENOUS | Status: DC | PRN

## 2022-01-26 MED ORDER — PROPOFOL 10 MG/ML IV BOLUS
INTRAVENOUS | Status: DC | PRN
  Administered 2022-01-26: 170 mg via INTRAVENOUS

## 2022-01-26 MED ORDER — FENTANYL CITRATE (PF) 100 MCG/2ML IJ SOLN: 25.0000 ug | INTRAMUSCULAR | Status: DC | PRN

## 2022-01-26 MED ORDER — FENTANYL CITRATE (PF) 100 MCG/2ML IJ SOLN
INTRAMUSCULAR | Status: DC | PRN
  Administered 2022-01-26: 100 ug via INTRAVENOUS

## 2022-01-26 MED ORDER — ACETAMINOPHEN 10 MG/ML IV SOLN
INTRAVENOUS | Status: AC
Start: 2022-01-26 — End: ?
  Filled 2022-01-26: qty 100

## 2022-01-26 MED ORDER — FENTANYL CITRATE (PF) 100 MCG/2ML IJ SOLN
INTRAMUSCULAR | Status: AC
  Filled 2022-01-26: qty 2

## 2022-01-26 MED ORDER — LIDOCAINE HCL (CARDIAC) PF 100 MG/5ML IV SOSY
PREFILLED_SYRINGE | INTRAVENOUS | Status: DC | PRN
Start: 2022-01-26 — End: 2022-01-26
  Administered 2022-01-26: 100 mg via INTRAVENOUS

## 2022-01-26 MED ORDER — METHYLENE BLUE 1 % INJ SOLN
INTRAVENOUS | Status: AC
  Filled 2022-01-26: qty 10

## 2022-01-26 MED ORDER — ONDANSETRON HCL 4 MG/2ML IJ SOLN: 4.0000 mg | Freq: Once | INTRAMUSCULAR | Status: DC | PRN

## 2022-01-26 MED ORDER — OXYCODONE HCL 5 MG/5ML PO SOLN: 5.0000 mg | Freq: Once | ORAL | Status: AC | PRN

## 2022-01-26 MED ORDER — HYDROCODONE-ACETAMINOPHEN 5-325 MG PO TABS: 1.0000 | ORAL_TABLET | ORAL | 0 refills | Status: DC | PRN

## 2022-01-26 MED ORDER — LACTATED RINGERS IV SOLN: INTRAVENOUS | Status: DC

## 2022-01-26 MED ORDER — SUCCINYLCHOLINE CHLORIDE 200 MG/10ML IV SOSY
PREFILLED_SYRINGE | INTRAVENOUS | Status: DC | PRN
  Administered 2022-01-26: 120 mg via INTRAVENOUS

## 2022-01-26 MED ORDER — CHLORHEXIDINE GLUCONATE 0.12 % MT SOLN
OROMUCOSAL | Status: AC
  Administered 2022-01-26: 15 mL via OROMUCOSAL
  Filled 2022-01-26: qty 15

## 2022-01-26 MED ORDER — MIDAZOLAM HCL 2 MG/2ML IJ SOLN
INTRAMUSCULAR | Status: AC
  Filled 2022-01-26: qty 2

## 2022-01-26 MED ORDER — ACETAMINOPHEN 10 MG/ML IV SOLN
INTRAVENOUS | Status: DC | PRN
  Administered 2022-01-26: 1000 mg via INTRAVENOUS

## 2022-01-26 MED ORDER — METHYLENE BLUE 1 % INJ SOLN
INTRAVENOUS | Status: DC | PRN
  Administered 2022-01-26: 5 mL via SUBMUCOSAL

## 2022-01-26 MED ORDER — OXYCODONE HCL 5 MG PO TABS
5.0000 mg | ORAL_TABLET | Freq: Once | ORAL | Status: AC | PRN
  Administered 2022-01-26: 5 mg via ORAL

## 2022-01-26 MED ORDER — FAMOTIDINE 20 MG PO TABS
ORAL_TABLET | ORAL | Status: AC
  Administered 2022-01-26: 20 mg via ORAL
  Filled 2022-01-26: qty 1

## 2022-01-26 MED ORDER — STERILE WATER FOR IRRIGATION IR SOLN
Status: DC | PRN
  Administered 2022-01-26: 500 mL

## 2022-01-26 MED ORDER — PHENYLEPHRINE HCL (PRESSORS) 10 MG/ML IV SOLN
INTRAVENOUS | Status: DC | PRN
Start: 2022-01-26 — End: 2022-01-26
  Administered 2022-01-26 (×6): 80 ug via INTRAVENOUS

## 2022-01-26 SURGICAL SUPPLY — 62 items
APL PRP STRL LF DISP 70% ISPRP (MISCELLANEOUS) ×1
BLADE BOVIE TIP EXT 4 (BLADE) IMPLANT
BLADE SURG 15 STRL SS SAFETY (BLADE) ×4 IMPLANT
BULB RESERV EVAC DRAIN JP 100C (MISCELLANEOUS) IMPLANT
CATH FOL 2WAY LX 18X30 (CATHETERS) ×1 IMPLANT
CHLORAPREP W/TINT 26 (MISCELLANEOUS) ×2 IMPLANT
CNTNR SPEC 2.5X3XGRAD LEK (MISCELLANEOUS)
CONT SPEC 4OZ STER OR WHT (MISCELLANEOUS)
CONT SPEC 4OZ STRL OR WHT (MISCELLANEOUS)
CONTAINER SPEC 2.5X3XGRAD LEK (MISCELLANEOUS) IMPLANT
COVER PROBE FLX POLY STRL (MISCELLANEOUS) ×2 IMPLANT
DEVICE DUBIN SPECIMEN MAMMOGRA (MISCELLANEOUS) ×2 IMPLANT
DRAIN CHANNEL JP 15F RND 16 (MISCELLANEOUS) IMPLANT
DRAPE LAPAROTOMY TRNSV 106X77 (MISCELLANEOUS) ×2 IMPLANT
DRSG GAUZE FLUFF 36X18 (GAUZE/BANDAGES/DRESSINGS) ×6 IMPLANT
DRSG TELFA 3X8 NADH (GAUZE/BANDAGES/DRESSINGS) ×2 IMPLANT
ELECT CAUTERY BLADE TIP 2.5 (TIP) ×2
ELECT REM PT RETURN 9FT ADLT (ELECTROSURGICAL) ×2
ELECTRODE CAUTERY BLDE TIP 2.5 (TIP) ×1 IMPLANT
ELECTRODE REM PT RTRN 9FT ADLT (ELECTROSURGICAL) ×1 IMPLANT
GAUZE 4X4 16PLY ~~LOC~~+RFID DBL (SPONGE) ×2 IMPLANT
GLOVE SURG ENC MOIS LTX SZ7.5 (GLOVE) ×2 IMPLANT
GLOVE SURG UNDER LTX SZ8 (GLOVE) ×2 IMPLANT
GOWN STRL REUS W/ TWL LRG LVL3 (GOWN DISPOSABLE) ×2 IMPLANT
GOWN STRL REUS W/TWL LRG LVL3 (GOWN DISPOSABLE) ×4
KIT TURNOVER KIT A (KITS) ×2 IMPLANT
LABEL OR SOLS (LABEL) ×2 IMPLANT
MANIFOLD NEPTUNE II (INSTRUMENTS) ×2 IMPLANT
MARGIN MAP 10MM (MISCELLANEOUS) ×2 IMPLANT
NDL HYPO 25X1 1.5 SAFETY (NEEDLE) ×2 IMPLANT
NDL SPNL 20GX3.5 QUINCKE YW (NEEDLE) IMPLANT
NEEDLE HYPO 22GX1.5 SAFETY (NEEDLE) ×2 IMPLANT
NEEDLE HYPO 25X1 1.5 SAFETY (NEEDLE) ×4 IMPLANT
NEEDLE SPNL 20GX3.5 QUINCKE YW (NEEDLE) IMPLANT
PACK BASIN MINOR ARMC (MISCELLANEOUS) ×2 IMPLANT
PAD DRESSING TELFA 3X8 NADH (GAUZE/BANDAGES/DRESSINGS) ×1 IMPLANT
PENCIL ELECTRO HAND CTR (MISCELLANEOUS) ×2 IMPLANT
RETRACTOR RING XSMALL (MISCELLANEOUS) IMPLANT
RTRCTR WOUND ALEXIS 13CM XS SH (MISCELLANEOUS) ×2
SHEARS FOC LG CVD HARMONIC 17C (MISCELLANEOUS) IMPLANT
SHEARS HARMONIC 9CM CVD (BLADE) IMPLANT
SLEVE PROBE SENORX GAMMA FIND (MISCELLANEOUS) ×1 IMPLANT
STAPLER SKIN PROX 35W (STAPLE) ×1 IMPLANT
STRIP CLOSURE SKIN 1/2X4 (GAUZE/BANDAGES/DRESSINGS) ×2 IMPLANT
SUT ETHILON 3-0 FS-10 30 BLK (SUTURE) ×2
SUT SILK 2 0 (SUTURE) ×2
SUT SILK 2-0 18XBRD TIE 12 (SUTURE) ×1 IMPLANT
SUT VIC AB 2-0 CT1 27 (SUTURE) ×12
SUT VIC AB 2-0 CT1 TAPERPNT 27 (SUTURE) ×2 IMPLANT
SUT VIC AB 3-0 54X BRD REEL (SUTURE) ×1 IMPLANT
SUT VIC AB 3-0 BRD 54 (SUTURE) ×2
SUT VIC AB 3-0 SH 27 (SUTURE) ×4
SUT VIC AB 3-0 SH 27X BRD (SUTURE) ×2 IMPLANT
SUT VIC AB 4-0 FS2 27 (SUTURE) ×4 IMPLANT
SUTURE EHLN 3-0 FS-10 30 BLK (SUTURE) ×1 IMPLANT
SWABSTK COMLB BENZOIN TINCTURE (MISCELLANEOUS) ×2 IMPLANT
SYR 10ML LL (SYRINGE) ×2 IMPLANT
SYR BULB IRRIG 60ML STRL (SYRINGE) ×2 IMPLANT
TAPE TRANSPORE STRL 2 31045 (GAUZE/BANDAGES/DRESSINGS) ×2 IMPLANT
TRAP NEPTUNE SPECIMEN COLLECT (MISCELLANEOUS) ×2 IMPLANT
WATER STERILE IRR 1000ML POUR (IV SOLUTION) ×2 IMPLANT
WATER STERILE IRR 500ML POUR (IV SOLUTION) ×2 IMPLANT

## 2022-01-26 NOTE — Transfer of Care (Signed)
Immediate Anesthesia Transfer of Care Note ? ?Patient: Dorothy Cunningham ? ?Procedure(s) Performed: BREAST LUMPECTOMY WITH SENTINEL LYMPH NODE BX (Left) ? ?Patient Location: PACU ? ?Anesthesia Type:General ? ?Level of Consciousness: drowsy ? ?Airway & Oxygen Therapy: Patient Spontanous Breathing and Patient connected to face mask oxygen ? ?Post-op Assessment: Report given to RN ? ?Post vital signs: stable ? ?Last Vitals:  ?Vitals Value Taken Time  ?BP    ?Temp    ?Pulse    ?Resp    ?SpO2    ? ? ?Last Pain:  ?Vitals:  ? 01/26/22 0825  ?TempSrc: Temporal  ?PainSc: 0-No pain  ?   ? ?Patients Stated Pain Goal: 0 (01/26/22 0825) ? ?Complications: No notable events documented. ?

## 2022-01-26 NOTE — Anesthesia Preprocedure Evaluation (Addendum)
Anesthesia Evaluation  ?Patient identified by MRN, date of birth, ID band ?Patient awake ? ? ? ?Reviewed: ?Allergy & Precautions, NPO status , Patient's Chart, lab work & pertinent test results ? ?History of Anesthesia Complications ?Negative for: history of anesthetic complications ? ?Airway ?Mallampati: I ? ? ?Neck ROM: Full ? ? ? Dental ? ?(+) Missing ?  ?Pulmonary ?sleep apnea , COPD, former smoker (quit 2016),  ?  ?Pulmonary exam normal ?breath sounds clear to auscultation ? ? ? ? ? ? Cardiovascular ?hypertension, Normal cardiovascular exam ?Rhythm:Regular Rate:Normal ? ?ECG 01/23/22:  ?Normal sinus rhythm ?Nonspecific ST and T wave abnormality ?  ?Neuro/Psych ?Seizures - (as a child),  PSYCHIATRIC DISORDERS Anxiety Depression   ? GI/Hepatic ?negative GI ROS,   ?Endo/Other  ?Class 3 obesity ? Renal/GU ?negative Renal ROS  ? ?  ?Musculoskeletal ? ? Abdominal ?  ?Peds ? Hematology ?Breast CA   ?Anesthesia Other Findings ? ? Reproductive/Obstetrics ? ?  ? ? ? ? ? ? ? ? ? ? ? ? ? ?  ?  ? ? ? ? ? ? ? ?Anesthesia Physical ?Anesthesia Plan ? ?ASA: 3 ? ?Anesthesia Plan: General  ? ?Post-op Pain Management:   ? ?Induction: Intravenous ? ?PONV Risk Score and Plan: 3 and Ondansetron, Dexamethasone and Treatment may vary due to age or medical condition ? ?Airway Management Planned: Oral ETT ? ?Additional Equipment:  ? ?Intra-op Plan:  ? ?Post-operative Plan: Extubation in OR ? ?Informed Consent: I have reviewed the patients History and Physical, chart, labs and discussed the procedure including the risks, benefits and alternatives for the proposed anesthesia with the patient or authorized representative who has indicated his/her understanding and acceptance.  ? ? ? ?Dental advisory given ? ?Plan Discussed with: CRNA ? ?Anesthesia Plan Comments: (Patient consented for risks of anesthesia including but not limited to:  ?- adverse reactions to medications ?- damage to eyes, teeth, lips or  other oral mucosa ?- nerve damage due to positioning  ?- sore throat or hoarseness ?- damage to heart, brain, nerves, lungs, other parts of body or loss of life ? ?Informed patient about role of CRNA in peri- and intra-operative care.  Patient voiced understanding.)  ? ? ? ? ? ? ?Anesthesia Quick Evaluation ? ?

## 2022-01-26 NOTE — Anesthesia Postprocedure Evaluation (Signed)
Anesthesia Post Note ? ?Patient: [name] ? ?Procedure(s) Performed: BREAST LUMPECTOMY WITH SENTINEL LYMPH NODE BX (Left) ? ?Patient location during evaluation: PACU ?Anesthesia Type: General ?Level of consciousness: awake and alert, oriented and patient cooperative ?Pain management: pain level controlled ?Vital Signs Assessment: post-procedure vital signs reviewed and stable ?Respiratory status: spontaneous breathing, nonlabored ventilation and respiratory function stable ?Cardiovascular status: blood pressure returned to baseline and stable ?Postop Assessment: adequate PO intake ?Anesthetic complications: no ? ? ?No notable events documented. ? ? ?Last Vitals:  ?Vitals:  ? 01/26/22 1245 01/26/22 1256  ?BP:  (!) 166/77  ?Pulse: 88 89  ?Resp: 18 16  ?Temp:  37.3 ?C  ?SpO2: 93% 94%  ?  ?Last Pain:  ?Vitals:  ? 01/26/22 1256  ?TempSrc: Temporal  ?PainSc: 3   ? ? ?  ?  ?  ?  ?  ?  ? ?Darrin Nipper ? ? ? ? ?

## 2022-01-26 NOTE — H&P (Signed)
Dorothy Cunningham ?[id number] ?[birth year] ? ?  ? ?HPI:  61 y/o with a T2,N0 carcinoma of the left breast. For breast conservation Tolerated SLN injection well.  ? ?Medications Prior to Admission  ?Medication Sig Dispense Refill Last Dose  ? albuterol (VENTOLIN HFA) 108 (90 Base) MCG/ACT inhaler INHALE 2 PUFFS INTO THE LUNGS EVERY 6 HOURS AS NEEDED FOR WHEEZING OR SHORTNESS OF BREATH 6.7 g 2 01/26/2022  ? aspirin 81 MG EC tablet Take 81 mg by mouth daily.   01/25/2022  ? atorvastatin (LIPITOR) 20 MG tablet TAKE 1 TABLET(20 MG) BY MOUTH DAILY 90 tablet 2 01/26/2022  ? b complex vitamins capsule Take 1 capsule by mouth daily.   Past Week  ? buPROPion (WELLBUTRIN XL) 300 MG 24 hr tablet TAKE 1 TABLET(300 MG) BY MOUTH DAILY 90 tablet 4 01/26/2022  ? Cholecalciferol (VITAMIN D) 50 MCG (2000 UT) CAPS Take 4,000 Units by mouth daily.   Past Week  ? diclofenac Sodium (VOLTAREN) 1 % GEL APPLY 2 GRAMS TOPICALLY TO THE AFFECTED AREA FOUR TIMES DAILY (Patient taking differently: Apply 2 g topically daily as needed (pain).) 100 g 0 Past Month  ? diphenhydrAMINE (BENADRYL) 25 MG tablet Take 25 mg by mouth daily.   Past Week  ? fexofenadine (ALLEGRA ALLERGY) 180 MG tablet Take 1 tablet (180 mg total) by mouth daily. 10 tablet 1 Past Week  ? fluticasone (FLONASE) 50 MCG/ACT nasal spray SHAKE LIQUID AND USE 2 SPRAYS IN EACH NOSTRIL DAILY 48 g 6 01/25/2022  ? Garlic 10 MG CAPS Take 20 mg by mouth daily.   Past Week  ? Glucosamine-Chondroit-Vit C-Mn (GLUCOSAMINE CHONDR 500 COMPLEX) CAPS Take 1 capsule by mouth daily.   Past Week  ? losartan (COZAAR) 100 MG tablet Take 1 tablet (100 mg total) by mouth daily. 90 tablet 4 Past Week  ? Multiple Vitamin (MULTIVITAMIN) tablet Take 1 tablet by mouth daily.   Past Week  ? nystatin ointment (MYCOSTATIN) APPLY TOPICALLY TO THE AFFECTED AREA THREE TIMES DAILY (Patient taking differently: Apply 1 application. topically daily as needed (rash).) 60 g 3 Past Week  ? Omega-3 Fatty Acids (FISH OIL PO) Take 1  capsule by mouth daily.   Past Week  ? umeclidinium-vilanterol (ANORO ELLIPTA) 62.5-25 MCG/ACT AEPB Inhale 1 puff into the lungs daily at 6 (six) AM. 60 each 4 01/26/2022  ? ?Allergies  ?Allergen Reactions  ? Penicillins Hives  ? ?Past Medical History:  ?Diagnosis Date  ? Allergic rhinitis   ? Anxiety   ? COPD (chronic obstructive pulmonary disease) ([place])   ? Depression   ? History of epilepsy   ? as a teenager  ? Hyperlipidemia   ? Hypertension   ? Sleep apnea   ? ?Past Surgical History:  ?Procedure Laterality Date  ? Bladder Tuck  1995  ? BREAST BIOPSY Left 2014  ? benign  ? BREAST BIOPSY Left 11/06/2021  ? u/s bx  5 o'clock, VENUS clip- INVASIVE MAMMARY CARCINOMA,  ? PARTIAL HYSTERECTOMY  1995  ? due to heavy periods  ? ?Social History  ? ?Socioeconomic History  ? Marital status: Married  ?  Spouse name: Dorothy Cunningham  ? Number of children: 4  ? Years of education: Not on file  ? Highest education level: Not on file  ?Occupational History  ? Not on file  ?Tobacco Use  ? Smoking status: Former  ?  Packs/day: 0.75  ?  Years: 40.00  ?  Pack years: 30.00  ?  Types: Cigarettes  ?  Quit date: 09/28/2014  ?  Years since quitting: 7.3  ? Smokeless tobacco: Never  ?Vaping Use  ? Vaping Use: Former  ?Substance and Sexual Activity  ? Alcohol use: No  ? Drug use: No  ? Sexual activity: Yes  ?Other Topics Concern  ? Not on file  ?Social History Narrative  ? Not on file  ? ?Social Determinants of Health  ? ?Financial Resource Strain: Low Risk   ? Difficulty of Paying Living Expenses: Not hard at all  ?Food Insecurity: No Food Insecurity  ? Worried About Charity fundraiser in the Last Year: Never true  ? Ran Out of Food in the Last Year: Never true  ?Transportation Needs: No Transportation Needs  ? Lack of Transportation (Medical): No  ? Lack of Transportation (Non-Medical): No  ?Physical Activity: Inactive  ? Days of Exercise per Week: 0 days  ? Minutes of Exercise per Session: 0 min  ?Stress: Stress Concern Present  ? Feeling of  Stress : To some extent  ?Social Connections: Moderately Integrated  ? Frequency of Communication with Friends and Family: Twice a week  ? Frequency of Social Gatherings with Friends and Family: Once a week  ? Attends Religious Services: 1 to 4 times per year  ? Active Member of Clubs or Organizations: No  ? Attends Archivist Meetings: Never  ? Marital Status: Married  ?Intimate Partner Violence: Not At Risk  ? Fear of Current or Ex-Partner: No  ? Emotionally Abused: No  ? Physically Abused: No  ? Sexually Abused: No  ? ?Social History  ? ?Social History Narrative  ? Not on file  ? ? ? ?ROS: Negative.  ? ? ? ?PE: ?HEENT: Negative. ?Lungs: Clear. ?Cardio: RR. ?Left breast: palpation in the lower outer quadrant of the left breast.   ? ? ? ?Assessment/Plan: ? ?Proceed with planned left breast wide excision.  ? ? ?Dorothy Cunningham ?01/26/2022 ?  ?

## 2022-01-26 NOTE — Op Note (Signed)
Preoperative diagnosis: Invasive mammary carcinoma of the left breast, lower outer quadrant.  Desire for breast conservation. ? ?Postoperative diagnosis: Same. ? ?Operative procedure left breast wide excision with ultrasound guidance, sentinel node biopsy, tissue transfer. ? ?Operating surgeon: Hervey Ard, MD. ? ?Assistant Elsie Stain, RNFA. ? ?Anesthesia: General endotracheal, Marcaine 0.5% with 1: 200,000's of epinephrine, 30 cc. ? ?Estimated blood loss: 5 cc. ? ?Clinical note: This 61 year old woman was recent identified with a 2 cm mass in the lower outer quadrant of the left breast.  She desired breast conservation.  She was injected with technetium sulfur colloid prior to the procedure.  Antibiotics were not indicated.  SCD stockings for DVT prevention. ? ?Operative note:The patient was injected with 5 cc of 1 half-strength methylene blue prior to prepping.  The patient has a very large breast, 44 triple D, and these were taped superiorly and medially to provide exposure to the lesion just above the level of the inframammary fold in the lower outer quadrant as well as to leave access to the axilla.  The breast chest and axilla was then cleansed with ChloraPrep and draped.  Ultrasound was used to identify the boundaries of the large tumor mass and local anesthesia was infiltrated for postoperative analgesia.  The tumor mass was found to be abutting the overlying dermis And for that reason a 4 x 9 cm ellipse of skin was resected in a radial fashion.  The skin was incised sharply remaining dissection completed with electrocautery.  The block of tissue was orientated and specimen radiograph showed the previously placed clip within the specimen.  The tumor mass appeared to be within the center of the specimen.  Subsequent report from the pathologist showed widely negative gross margins. ? ?While the breast was being processed attention was turned to the axilla.  The node seeker device was used to identify  the area of increased uptake and local anesthesia was infiltrated.  A transverse incision was made with the remaining dissection completed by electrocautery.  It was approximately 6 cm down to the axillary envelope.  An extra small Alexis wound protector was placed to provide better exposure.  3 hot, non-blue nodes were identified.  Scanning through the axilla showed counts had fallen from 5000-90 after these were removed.  Hemostasis was electrocautery.  The axillary envelope was closed with 2-0 Vicryl figure-of-eight sutures as well as the superficial adipose layer.  The skin was closed with a running 4-0 Vicryl subcuticular suture.  At the end of the procedure benzoin and Steri-Strips followed by Telfa pad were applied. ? ?With the final report from the pathologist available attention was turned to closing the breast defect.  Due to the breast volume the hopes were to be able to make use of MammoSite partial breast radiation.  For that reason a Foley catheter was brought in from a lateral stab wound incision and inflated to 45 cc.  The tissue was then approximated in multiple layers with interrupted 2-0 Vicryl sutures after mobilizing approximate the 15+ centimeter square of tissue to allow closure.  The skin approximation was under the most tension at the level of the inframammary fold.  Multiple rows of 2-0 Vicryl figure-of-eight sutures were used and the dermis was approximated with interrupted 2-0 Vicryl simple sutures.  Staples were applied.  The balloon was removed and the entrance site closed with a single staple.  Telfa, fluff gauze and a compressive wrap were applied. ? ?The patient tolerated the procedure well and was taken to the  PACU in stable condition. ?

## 2022-01-26 NOTE — Discharge Instructions (Signed)
AMBULATORY SURGERY  ?DISCHARGE INSTRUCTIONS ? ? ?The drugs that you were given will stay in your system until tomorrow so for the next 24 hours you should not: ? ?Drive an automobile ?Make any legal decisions ?Drink any alcoholic beverage ? ? ?You may resume regular meals tomorrow.  Today it is better to start with liquids and gradually work up to solid foods. ? ?You may eat anything you prefer, but it is better to start with liquids, then soup and crackers, and gradually work up to solid foods. ? ? ?Please notify your doctor immediately if you have any unusual bleeding, trouble breathing, redness and pain at the surgery site, drainage, fever, or pain not relieved by medication. ? ? ? ?Additional Instructions: ? ? ? ?Please contact your physician with any problems or Same Day Surgery at [phone], Monday through Friday 6 am to 4 pm, or [place] [facility] number at [phone].  ?

## 2022-01-26 NOTE — Anesthesia Procedure Notes (Signed)
Procedure Name: Intubation ?Date/Time: 01/26/2022 10:01 AM ?Performed by: Lerry Liner, CRNA ?Pre-anesthesia Checklist: Patient identified, Emergency Drugs available, Suction available and Patient being monitored ?Patient Re-evaluated:Patient Re-evaluated prior to induction ?Oxygen Delivery Method: Circle system utilized ?Preoxygenation: Pre-oxygenation with 100% oxygen ?Induction Type: IV induction ?Ventilation: Mask ventilation without difficulty ?Laryngoscope Size: McGraph and 3 ?Grade View: Grade I ?Tube type: Oral ?Tube size: 7.0 mm ?Number of attempts: 1 ?Airway Equipment and Method: Stylet and Oral airway ?Placement Confirmation: ETT inserted through vocal cords under direct vision, positive ETCO2 and breath sounds checked- equal and bilateral ?Secured at: 22 cm ?Tube secured with: Tape ?Dental Injury: Teeth and Oropharynx as per pre-operative assessment  ? ? ? ? ?

## 2022-01-27 ENCOUNTER — Telehealth: Payer: Self-pay | Admitting: Licensed Clinical Social Worker

## 2022-01-27 ENCOUNTER — Encounter: Payer: Self-pay | Admitting: General Surgery

## 2022-01-28 ENCOUNTER — Other Ambulatory Visit: Payer: Self-pay | Admitting: Pathology

## 2022-01-28 LAB — SURGICAL PATHOLOGY

## 2022-02-02 ENCOUNTER — Telehealth: Payer: Self-pay | Admitting: Oncology

## 2022-02-02 ENCOUNTER — Telehealth: Payer: Self-pay

## 2022-02-02 NOTE — Telephone Encounter (Signed)
request for Oncotype DX testing submitted online on breast specimen [phone].  ?

## 2022-02-02 NOTE — Telephone Encounter (Signed)
called pt to confirm appt time, pt states that appt for 05/26 was better for her schedule...KJ  ?

## 2022-02-02 NOTE — Telephone Encounter (Signed)
Request for Ominiseq testing has been faxed to [facility] pathology. Fax confirmation received. ? ?Specimen ID# [id number] ?collected on  5/1.  ?

## 2022-02-02 NOTE — Telephone Encounter (Signed)
-----   Message from Earlie Server, MD sent at 01/31/2022  9:40 PM EDT ----- ?Please send Oncotype dx on this specimen and schedule patient to see me 2.5 weeks after test is sent. Thanks. MD only.  ?

## 2022-02-02 NOTE — Telephone Encounter (Signed)
Spoke to [place] at [facility] path and asked to cancel [facility] testing. Will request OncotypeDx.  ? ?

## 2022-02-03 ENCOUNTER — Other Ambulatory Visit: Payer: Self-pay | Admitting: General Surgery

## 2022-02-03 DIAGNOSIS — C50919 Malignant neoplasm of unspecified site of unspecified female breast: Secondary | ICD-10-CM

## 2022-02-03 DIAGNOSIS — C50512 Malignant neoplasm of lower-outer quadrant of left female breast: Secondary | ICD-10-CM | POA: Diagnosis not present

## 2022-02-09 ENCOUNTER — Telehealth: Payer: Self-pay | Admitting: Oncology

## 2022-02-09 NOTE — Telephone Encounter (Signed)
pt called in req for appt to be r/s on day of other appt for transportation reasons...KJ  ?

## 2022-02-10 ENCOUNTER — Encounter: Payer: Self-pay | Admitting: Oncology

## 2022-02-10 ENCOUNTER — Other Ambulatory Visit: Payer: Self-pay

## 2022-02-10 NOTE — Telephone Encounter (Signed)
Oncotype results available in media.  ?

## 2022-02-11 ENCOUNTER — Ambulatory Visit: Payer: Federal, State, Local not specified - PPO | Admitting: Radiation Oncology

## 2022-02-16 ENCOUNTER — Encounter: Payer: Self-pay | Admitting: Licensed Clinical Social Worker

## 2022-02-16 NOTE — Telephone Encounter (Signed)
Attempted to reach Dorothy Cunningham x3 with genetic test results. Will send patient letter with instructions on how to reach [place] to let us know if she would still like her test results or if she has decided she no longer wants them.

## 2022-02-17 ENCOUNTER — Ambulatory Visit: Payer: Federal, State, Local not specified - PPO | Admitting: Oncology

## 2022-02-20 ENCOUNTER — Ambulatory Visit: Payer: Federal, State, Local not specified - PPO | Admitting: Oncology

## 2022-02-27 ENCOUNTER — Other Ambulatory Visit: Payer: Self-pay | Admitting: Nurse Practitioner

## 2022-02-27 NOTE — Telephone Encounter (Signed)
Requested Prescriptions  Pending Prescriptions Disp Refills  . diclofenac Sodium (VOLTAREN) 1 % GEL [Pharmacy Med Name: DICLOFENAC 1% GEL 100GM (RX)] 100 g 0    Sig: APPLY 2 GRAMS TOPICALLY TO THE AFFECTED AREA FOUR TIMES DAILY     Analgesics:  Topicals Failed - 02/27/2022  6:34 AM      Failed - Manual Review: Labs are only required if the patient has taken medication for more than 8 weeks.      Failed - HGB in normal range and within 360 days    Hemoglobin  Date Value Ref Range Status  11/14/2021 15.1 (H) 12.0 - 15.0 g/dL Final  10/20/2021 15.9 11.1 - 15.9 g/dL Final         Failed - Cr in normal range and within 360 days    Creatinine, Ser  Date Value Ref Range Status  11/17/2021 1.18 (H) 0.57 - 1.00 mg/dL Final         Passed - PLT in normal range and within 360 days    Platelets  Date Value Ref Range Status  11/14/2021 307 150 - 400 K/uL Final  10/20/2021 326 150 - 450 x10E3/uL Final         Passed - HCT in normal range and within 360 days    HCT  Date Value Ref Range Status  11/14/2021 43.8 36.0 - 46.0 % Final   Hematocrit  Date Value Ref Range Status  10/20/2021 45.7 34.0 - 46.6 % Final         Passed - eGFR is 30 or above and within 360 days    GFR calc Af Amer  Date Value Ref Range Status  11/01/2020 53 (L) >59 mL/min/1.73 Final    Comment:    **In accordance with recommendations from the NKF-ASN Task force,**   [facility] is in the process of updating its eGFR calculation to the   2021 CKD-EPI creatinine equation that estimates kidney function   without a race variable.    GFR calc non Af Amer  Date Value Ref Range Status  11/01/2020 46 (L) >59 mL/min/1.73 Final   eGFR  Date Value Ref Range Status  11/17/2021 52 (L) >59 mL/min/1.73 Final         Passed - Patient is not pregnant      Passed - Valid encounter within last 12 months    Recent Outpatient Visits          2 months ago Invasive carcinoma of breast ([place])   [place],  Jolene T, NP   3 months ago Invasive carcinoma of breast ([place])   [place], Jolene T, NP   4 months ago Centrilobular emphysema ([place])   [place], Barbaraann Faster, NP   10 months ago COVID-19   [employer], [place], MD   1 year ago Centrilobular emphysema ([place])   [place], Barbaraann Faster, NP      Future Appointments            In 2 months [place], Barbaraann Faster, NP [employer]

## 2022-03-11 ENCOUNTER — Encounter: Payer: Self-pay | Admitting: Oncology

## 2022-03-11 ENCOUNTER — Ambulatory Visit
Admission: RE | Admit: 2022-03-11 | Discharge: 2022-03-11 | Disposition: A | Discharge status: Home / Self Care | Payer: Federal, State, Local not specified - PPO | Source: Ambulatory Visit | Attending: Radiation Oncology | Admitting: Radiation Oncology

## 2022-03-11 ENCOUNTER — Inpatient Hospital Stay: Payer: Federal, State, Local not specified - PPO | Attending: Oncology | Admitting: Oncology

## 2022-03-11 VITALS — BP 193/98 | HR 84 | Temp 98.9°F | Wt 256.0 lb

## 2022-03-11 VITALS — BP 193/98 | HR 84 | Temp 98.9°F | Resp 22 | Ht 60.5 in | Wt 256.8 lb

## 2022-03-11 DIAGNOSIS — Z803 Family history of malignant neoplasm of breast: Secondary | ICD-10-CM | POA: Diagnosis not present

## 2022-03-11 DIAGNOSIS — E785 Hyperlipidemia, unspecified: Secondary | ICD-10-CM | POA: Diagnosis not present

## 2022-03-11 DIAGNOSIS — Z87891 Personal history of nicotine dependence: Secondary | ICD-10-CM | POA: Insufficient documentation

## 2022-03-11 DIAGNOSIS — Z833 Family history of diabetes mellitus: Secondary | ICD-10-CM | POA: Insufficient documentation

## 2022-03-11 DIAGNOSIS — C50812 Malignant neoplasm of overlapping sites of left female breast: Secondary | ICD-10-CM | POA: Diagnosis not present

## 2022-03-11 DIAGNOSIS — C50919 Malignant neoplasm of unspecified site of unspecified female breast: Secondary | ICD-10-CM

## 2022-03-11 DIAGNOSIS — Z7189 Other specified counseling: Secondary | ICD-10-CM

## 2022-03-11 DIAGNOSIS — J449 Chronic obstructive pulmonary disease, unspecified: Secondary | ICD-10-CM | POA: Diagnosis not present

## 2022-03-11 DIAGNOSIS — Z8261 Family history of arthritis: Secondary | ICD-10-CM | POA: Diagnosis not present

## 2022-03-11 DIAGNOSIS — Z79899 Other long term (current) drug therapy: Secondary | ICD-10-CM | POA: Insufficient documentation

## 2022-03-11 DIAGNOSIS — C50512 Malignant neoplasm of lower-outer quadrant of left female breast: Secondary | ICD-10-CM | POA: Diagnosis not present

## 2022-03-11 DIAGNOSIS — Z8349 Family history of other endocrine, nutritional and metabolic diseases: Secondary | ICD-10-CM | POA: Insufficient documentation

## 2022-03-11 DIAGNOSIS — N6310 Unspecified lump in the right breast, unspecified quadrant: Secondary | ICD-10-CM | POA: Insufficient documentation

## 2022-03-11 DIAGNOSIS — G4730 Sleep apnea, unspecified: Secondary | ICD-10-CM | POA: Insufficient documentation

## 2022-03-11 DIAGNOSIS — N1831 Chronic kidney disease, stage 3a: Secondary | ICD-10-CM | POA: Diagnosis not present

## 2022-03-11 DIAGNOSIS — F321 Major depressive disorder, single episode, moderate: Secondary | ICD-10-CM | POA: Diagnosis not present

## 2022-03-11 DIAGNOSIS — I129 Hypertensive chronic kidney disease with stage 1 through stage 4 chronic kidney disease, or unspecified chronic kidney disease: Secondary | ICD-10-CM | POA: Diagnosis not present

## 2022-03-11 DIAGNOSIS — Z836 Family history of other diseases of the respiratory system: Secondary | ICD-10-CM | POA: Insufficient documentation

## 2022-03-11 DIAGNOSIS — E559 Vitamin D deficiency, unspecified: Secondary | ICD-10-CM

## 2022-03-11 DIAGNOSIS — Z8249 Family history of ischemic heart disease and other diseases of the circulatory system: Secondary | ICD-10-CM | POA: Diagnosis not present

## 2022-03-11 DIAGNOSIS — G40909 Epilepsy, unspecified, not intractable, without status epilepticus: Secondary | ICD-10-CM | POA: Insufficient documentation

## 2022-03-11 DIAGNOSIS — Z7982 Long term (current) use of aspirin: Secondary | ICD-10-CM | POA: Insufficient documentation

## 2022-03-11 DIAGNOSIS — F32A Depression, unspecified: Secondary | ICD-10-CM | POA: Insufficient documentation

## 2022-03-11 DIAGNOSIS — Z170 Estrogen receptor positive status [ER+]: Secondary | ICD-10-CM | POA: Diagnosis not present

## 2022-03-11 DIAGNOSIS — I10 Essential (primary) hypertension: Secondary | ICD-10-CM | POA: Diagnosis not present

## 2022-03-11 DIAGNOSIS — Z880 Allergy status to penicillin: Secondary | ICD-10-CM | POA: Diagnosis not present

## 2022-03-11 NOTE — Assessment & Plan Note (Signed)
Avoid nephrotoxins.  Encourage oral hydration  

## 2022-03-11 NOTE — Assessment & Plan Note (Signed)
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 Recommend patient to establish care with radiation oncology for adjuvant radiation. Discussed about the rationale and potential side effects of adjuvant endocrine therapy. Recommend DEXA scan for baseline bone density evaluation.

## 2022-03-11 NOTE — Progress Notes (Signed)
Hematology/Oncology progress note [phone] [phone]         Patient Care Team: Venita Lick, NP as PCP - General (Nurse Practitioner) Theodore Demark, RN (Inactive) as Oncology Nurse Navigator  ASSESSMENT & PLAN:    Goals of care, counseling/discussion Discussed with patient  Invasive carcinoma of breast ([place]) pT2 pN0 ER+, PR+, HER- Oncotype Dx 14, no adjuvant chemotherapy benefit. Recommend patient to establish care with radiation oncology for adjuvant radiation. Discussed about the rationale and potential side effects of adjuvant endocrine therapy. Recommend DEXA scan for baseline bone density evaluation.  Stage 3a chronic kidney disease (HCC) Avoid nephrotoxins.  Encourage oral hydration   Orders Placed This Encounter  Procedures   DG Bone Density    Standing Status:   Future    Standing Expiration Date:   03/12/2023    Order Specific Question:   Reason for Exam (SYMPTOM  OR DIAGNOSIS REQUIRED)    Answer:   breast cancer    Order Specific Question:   Preferred imaging location?    Answer:   [facility]   Follow-up 2 to 3 weeks after radiation. All questions were answered. The patient knows to call the clinic with any problems, questions or concerns.  Earlie Server, MD, PhD [facility] Hematology Oncology 03/11/2022   CHIEF COMPLAINTS/REASON FOR VISIT:  Follow up for left breast cancer  HISTORY OF PRESENTING ILLNESS:   Dorothy Cunningham is a  61 y.o.  female with PMH listed below was seen for follow-up of left breast cancer Oncological history summary as below. Oncology History  Invasive carcinoma of breast ([place])  11/14/2021 Initial Diagnosis   Invasive carcinoma of breast [facility]) Patient felt a left breast mass. 10/30/2021, bilateral diagnostic mammogram showed 21 mm mass in the left breast, 5:00, 9 cm from nipple.  No suspicious left axillary lymphadenopathy.  No mammographic evidence of malignancy in the right breast.    11/06/2021, left breast ultrasound-guided biopsy showed invasive mammary carcinoma, no special type.  Grade 3, DCIS not identified, LVI not identified.  ER+/PR +HER2 - Menarche 61 years of age, patient has 3 children.  She was 60 years old when she gave birth to her first child.  Short-term OCP use from [phone].  Patient has a history of hysterectomy 1990.  She denies family history of breast cancer. Denies any hormone replacement therapy.  Denies any chest radiation.  She recalls left breast biopsy in 2012.  She did not know details of the pathology.   11/14/2021 Cancer Staging   Staging form: Breast, AJCC 8th Edition - Clinical stage from 11/14/2021: cT2, cN0, cM0, G3, ER: Unknown, PR: Unknown, HER2: Unknown - Signed by Earlie Server, MD on 11/14/2021 Stage prefix: Initial diagnosis Histologic grading system: 3 grade system   01/26/2022 Surgery   Left breast lumpectomy and sentinel lymph node biopsy. Invasive mammary carcinoma, DCIS, 3 lymph nodes negative for malignancy.  Grade 3, all margins negative for invasive carcinoma.  Distance from closest margin 4 mm.  All margins negative for DCIS. pT2 pN0   01/26/2022 Oncotype testing   Oncotype DX breast recurrence score  14    Patient reports feeling well.  Surgical site is healing well.  No new complaints.  Review of Systems  Constitutional:  Negative for appetite change, chills, fatigue and fever.  HENT:   Negative for hearing loss and voice change.   Eyes:  Negative for eye problems.  Respiratory:  Negative for chest tightness and cough.   Cardiovascular:  Negative  for chest pain.  Gastrointestinal:  Negative for abdominal distention, abdominal pain and blood in stool.  Endocrine: Negative for hot flashes.  Genitourinary:  Negative for difficulty urinating and frequency.   Musculoskeletal:  Negative for arthralgias.  Skin:  Negative for itching and rash.  Neurological:  Negative for extremity weakness.  Hematological:  Negative for  adenopathy.  Psychiatric/Behavioral:  Negative for confusion.     MEDICAL HISTORY:  Past Medical History:  Diagnosis Date   Allergic rhinitis    Anxiety    COPD (chronic obstructive pulmonary disease) ([place])    Depression    History of epilepsy    as a teenager   Hyperlipidemia    Hypertension    Sleep apnea     SURGICAL HISTORY: Past Surgical History:  Procedure Laterality Date   Bladder Tuck  1995   BREAST BIOPSY Left 2014   benign   BREAST BIOPSY Left 11/06/2021   u/s bx  5 o'clock, VENUS clip- INVASIVE MAMMARY CARCINOMA,   BREAST LUMPECTOMY WITH SENTINEL LYMPH NODE BIOPSY Left 01/26/2022   Procedure: BREAST LUMPECTOMY WITH SENTINEL LYMPH NODE BX;  Surgeon: Robert Bellow, MD;  Location: [facility] ORS;  Service: General;  Laterality: Left;   PARTIAL HYSTERECTOMY  1995   due to heavy periods    SOCIAL HISTORY: Social History   Socioeconomic History   Marital status: Married    Spouse name: Charles   Number of children: 4   Years of education: Not on file   Highest education level: Not on file  Occupational History   Not on file  Tobacco Use   Smoking status: Former    Packs/day: 0.75    Years: 40.00    Total pack years: 30.00    Types: Cigarettes    Quit date: 09/28/2014    Years since quitting: 7.4   Smokeless tobacco: Never  Vaping Use   Vaping Use: Former  Substance and Sexual Activity   Alcohol use: No   Drug use: No   Sexual activity: Yes  Other Topics Concern   Not on file  Social History Narrative   Not on file   Social Determinants of Health   Financial Resource Strain: Low Risk  (12/01/2021)   Overall Financial Resource Strain (CARDIA)    Difficulty of Paying Living Expenses: Not hard at all  Food Insecurity: No Food Insecurity (12/01/2021)   Hunger Vital Sign    Worried About Running Out of Food in the Last Year: Never true    Ran Out of Food in the Last Year: Never true  Transportation Needs: No Transportation Needs (12/01/2021)   PRAPARE -  Hydrologist (Medical): No    Lack of Transportation (Non-Medical): No  Physical Activity: Inactive (12/01/2021)   Exercise Vital Sign    Days of Exercise per Week: 0 days    Minutes of Exercise per Session: 0 min  Stress: Stress Concern Present (12/01/2021)   [place]    Feeling of Stress : To some extent  Social Connections: Moderately Integrated (12/01/2021)   Social Connection and Isolation Panel [NHANES]    Frequency of Communication with Friends and Family: Twice a week    Frequency of Social Gatherings with Friends and Family: Once a week    Attends Religious Services: 1 to 4 times per year    Active Member of [employer] or Organizations: No    Attends Archivist Meetings: Never  Marital Status: Married  Human resources officer Violence: Not At Risk (12/01/2021)   Humiliation, Afraid, Rape, and Kick questionnaire    Fear of Current or Ex-Partner: No    Emotionally Abused: No    Physically Abused: No    Sexually Abused: No    FAMILY HISTORY: Family History  Problem Relation Age of Onset   Thyroid disease Mother    Asthma Mother    Osteoarthritis Father    CAD Father    Diabetes Maternal Uncle    Diabetes Maternal Grandfather    Tuberculosis Maternal Grandfather    Diabetes Paternal Grandmother    Heart attack Paternal Grandfather    Breast cancer Other        great grandmother    ALLERGIES:  is allergic to penicillins.  MEDICATIONS:  Current Outpatient Medications  Medication Sig Dispense Refill   albuterol (VENTOLIN HFA) 108 (90 Base) MCG/ACT inhaler INHALE 2 PUFFS INTO THE LUNGS EVERY 6 HOURS AS NEEDED FOR WHEEZING OR SHORTNESS OF BREATH 6.7 g 2   aspirin 81 MG EC tablet Take 81 mg by mouth daily.     atorvastatin (LIPITOR) 20 MG tablet TAKE 1 TABLET(20 MG) BY MOUTH DAILY 90 tablet 2   b complex vitamins capsule Take 1 capsule by mouth daily.     buPROPion (WELLBUTRIN XL)  300 MG 24 hr tablet TAKE 1 TABLET(300 MG) BY MOUTH DAILY 90 tablet 4   Cholecalciferol (VITAMIN D) 50 MCG (2000 UT) CAPS Take 4,000 Units by mouth daily.     diclofenac Sodium (VOLTAREN) 1 % GEL APPLY 2 GRAMS TOPICALLY TO THE AFFECTED AREA FOUR TIMES DAILY 100 g 0   diphenhydrAMINE (BENADRYL) 25 MG tablet Take 25 mg by mouth daily.     fexofenadine (ALLEGRA ALLERGY) 180 MG tablet Take 1 tablet (180 mg total) by mouth daily. 10 tablet 1   fluticasone (FLONASE) 50 MCG/ACT nasal spray SHAKE LIQUID AND USE 2 SPRAYS IN EACH NOSTRIL DAILY 48 g 6   Garlic 10 MG CAPS Take 20 mg by mouth daily.     HYDROcodone-acetaminophen (NORCO/VICODIN) 5-325 MG tablet Take 1 tablet by mouth every 4 (four) hours as needed for moderate pain. 15 tablet 0   losartan (COZAAR) 100 MG tablet Take 1 tablet (100 mg total) by mouth daily. 90 tablet 4   Multiple Vitamin (MULTIVITAMIN) tablet Take 1 tablet by mouth daily.     nystatin ointment (MYCOSTATIN) APPLY TOPICALLY TO THE AFFECTED AREA THREE TIMES DAILY (Patient taking differently: Apply 1 application  topically daily as needed (rash).) 60 g 3   Omega-3 Fatty Acids (FISH OIL PO) Take 1 capsule by mouth daily.     umeclidinium-vilanterol (ANORO ELLIPTA) 62.5-25 MCG/ACT AEPB Inhale 1 puff into the lungs daily at 6 (six) AM. 60 each 4   Glucosamine-Chondroit-Vit C-Mn (GLUCOSAMINE CHONDR 500 COMPLEX) CAPS Take 1 capsule by mouth daily. (Patient not taking: Reported on 03/11/2022)     No current facility-administered medications for this visit.     PHYSICAL EXAMINATION: ECOG PERFORMANCE STATUS: 0 - Asymptomatic Vitals:   03/11/22 1031  BP: (!) 193/98  Pulse: 84  Temp: 98.9 F (37.2 C)   Filed Weights   03/11/22 1031  Weight: 256 lb (116.1 kg)    Physical Exam Constitutional:      General: She is not in acute distress. HENT:     Head: Normocephalic and atraumatic.  Eyes:     General: No scleral icterus. Cardiovascular:     Rate and Rhythm: Normal rate and  regular rhythm.     Heart sounds: Normal heart sounds.  Pulmonary:     Effort: Pulmonary effort is normal. No respiratory distress.     Breath sounds: No wheezing.  Abdominal:     General: Bowel sounds are normal. There is no distension.     Palpations: Abdomen is soft.  Musculoskeletal:        General: No deformity. Normal range of motion.     Cervical back: Normal range of motion and neck supple.  Skin:    General: Skin is warm and dry.     Findings: No erythema or rash.  Neurological:     Mental Status: She is alert and oriented to person, place, and time. Mental status is at baseline.     Cranial Nerves: No cranial nerve deficit.     Coordination: Coordination normal.  Psychiatric:        Mood and Affect: Mood normal.     LABORATORY DATA:  I have reviewed the data as listed Lab Results  Component Value Date   WBC 9.3 11/14/2021   HGB 15.1 (H) 11/14/2021   HCT 43.8 11/14/2021   MCV 93.4 11/14/2021   PLT 307 11/14/2021   Recent Labs    10/20/21 1552 11/17/21 1618  NA 144 141  K 4.0 3.7  CL 101 102  CO2 24 24  GLUCOSE 91 89  BUN 14 10  CREATININE 1.27* 1.18*  CALCIUM 10.0 10.1  PROT 7.4  --   ALBUMIN 4.7  --   AST 31  --   ALT 37*  --   ALKPHOS 70  --   BILITOT 0.4  --     Iron/TIBC/Ferritin/ %Sat No results found for: "IRON", "TIBC", "FERRITIN", "IRONPCTSAT"    RADIOGRAPHIC STUDIES: I have personally reviewed the radiological images as listed and agreed with the findings in the report. No results found.

## 2022-03-11 NOTE — Consult Note (Signed)
NEW PATIENT EVALUATION  Name: Dorothy Cunningham  MRN: [id number]  Date:   03/11/2022     DOB: 05/01/1961   This 61 y.o. female patient presents to the clinic for initial evaluation of stage IIa (T2 N0 M0) grade 3 ER/PR positive HER2 negative invasive mammary carcinoma of the left breast status post wide local excision.  REFERRING PHYSICIAN: Venita Lick, NP  CHIEF COMPLAINT:  Chief Complaint  Patient presents with   Consult    Breast    DIAGNOSIS: The encounter diagnosis was Invasive carcinoma of breast ([place]).   PREVIOUS INVESTIGATIONS:  Mammogram ultrasound reviewed Clinical notes reviewed Pathology reports reviewed  HPI: Patient is a 61 year old female presented to self discovered mass in her left breast.  Imaging confirmed a 2.1 cm left breast mass 5 o'clock position 9 cm from the nipple concerning for malignancy.  Targeted ultrasound of the left axilla showed no suspicious axillary lymph nodes.  She underwent targeted biopsy which was positive for invasive mammary carcinoma.  She went on to have a wide local excision for a 2.8 cm grade 3 invasive mammary carcinoma of no special type.  Margins were clear at 4 mm.  3 sentinel nodes were examined all negative for metastatic disease.  Tumor was ER/PR positive HER2/neu not overexpressed.  She had Oncotype DX performed showing a recurrence score of 14 negating the need for systemic chemotherapy.  She is doing well still has a small area of ulceration which is bandaged.  She is seen today for consideration of adjuvant radiation therapy.  PLANNED TREATMENT REGIMEN: Accelerated partial breast irradiation using IMRT treatment planning and delivery  PAST MEDICAL HISTORY:  has a past medical history of Allergic rhinitis, Anxiety, COPD (chronic obstructive pulmonary disease) ([place]), Depression, History of epilepsy, Hyperlipidemia, Hypertension, and Sleep apnea.    PAST SURGICAL HISTORY:  Past Surgical History:  Procedure Laterality  Date   Bladder Tuck  1995   BREAST BIOPSY Left 2014   benign   BREAST BIOPSY Left 11/06/2021   u/s bx  5 o'clock, VENUS clip- INVASIVE MAMMARY CARCINOMA,   BREAST LUMPECTOMY WITH SENTINEL LYMPH NODE BIOPSY Left 01/26/2022   Procedure: BREAST LUMPECTOMY WITH SENTINEL LYMPH NODE BX;  Surgeon: Robert Bellow, MD;  Location: [facility] ORS;  Service: General;  Laterality: Left;   PARTIAL HYSTERECTOMY  1995   due to heavy periods    FAMILY HISTORY: family history includes Asthma in her mother; Breast cancer in an other family member; CAD in her father; Diabetes in her maternal grandfather, maternal uncle, and paternal grandmother; Heart attack in her paternal grandfather; Osteoarthritis in her father; Thyroid disease in her mother; Tuberculosis in her maternal grandfather.  SOCIAL HISTORY:  reports that she quit smoking about 7 years ago. Her smoking use included cigarettes. She has a 30.00 pack-year smoking history. She has never used smokeless tobacco. She reports that she does not drink alcohol and does not use drugs.  ALLERGIES: Penicillins  MEDICATIONS:  Current Outpatient Medications  Medication Sig Dispense Refill   atorvastatin (LIPITOR) 20 MG tablet TAKE 1 TABLET(20 MG) BY MOUTH DAILY 90 tablet 2   b complex vitamins capsule Take 1 capsule by mouth daily.     buPROPion (WELLBUTRIN XL) 300 MG 24 hr tablet TAKE 1 TABLET(300 MG) BY MOUTH DAILY 90 tablet 4   Cholecalciferol (VITAMIN D) 50 MCG (2000 UT) CAPS Take 4,000 Units by mouth daily.     diclofenac Sodium (VOLTAREN) 1 % GEL APPLY 2 GRAMS TOPICALLY TO THE AFFECTED  AREA FOUR TIMES DAILY 100 g 0   diphenhydrAMINE (BENADRYL) 25 MG tablet Take 25 mg by mouth daily.     fexofenadine (ALLEGRA ALLERGY) 180 MG tablet Take 1 tablet (180 mg total) by mouth daily. 10 tablet 1   fluticasone (FLONASE) 50 MCG/ACT nasal spray SHAKE LIQUID AND USE 2 SPRAYS IN EACH NOSTRIL DAILY 48 g 6   Garlic 10 MG CAPS Take 20 mg by mouth daily.      HYDROcodone-acetaminophen (NORCO/VICODIN) 5-325 MG tablet Take 1 tablet by mouth every 4 (four) hours as needed for moderate pain. 15 tablet 0   losartan (COZAAR) 100 MG tablet Take 1 tablet (100 mg total) by mouth daily. 90 tablet 4   Multiple Vitamin (MULTIVITAMIN) tablet Take 1 tablet by mouth daily.     nystatin ointment (MYCOSTATIN) APPLY TOPICALLY TO THE AFFECTED AREA THREE TIMES DAILY (Patient taking differently: Apply 1 application  topically daily as needed (rash).) 60 g 3   Omega-3 Fatty Acids (FISH OIL PO) Take 1 capsule by mouth daily.     umeclidinium-vilanterol (ANORO ELLIPTA) 62.5-25 MCG/ACT AEPB Inhale 1 puff into the lungs daily at 6 (six) AM. 60 each 4   albuterol (VENTOLIN HFA) 108 (90 Base) MCG/ACT inhaler INHALE 2 PUFFS INTO THE LUNGS EVERY 6 HOURS AS NEEDED FOR WHEEZING OR SHORTNESS OF BREATH 6.7 g 2   aspirin 81 MG EC tablet Take 81 mg by mouth daily.     Glucosamine-Chondroit-Vit C-Mn (GLUCOSAMINE CHONDR 500 COMPLEX) CAPS Take 1 capsule by mouth daily. (Patient not taking: Reported on 03/11/2022)     No current facility-administered medications for this encounter.    ECOG PERFORMANCE STATUS:  0 - Asymptomatic  REVIEW OF SYSTEMS: Patient denies any weight loss, fatigue, weakness, fever, chills or night sweats. Patient denies any loss of vision, blurred vision. Patient denies any ringing  of the ears or hearing loss. No irregular heartbeat. Patient denies heart murmur or history of fainting. Patient denies any chest pain or pain radiating to her upper extremities. Patient denies any shortness of breath, difficulty breathing at night, cough or hemoptysis. Patient denies any swelling in the lower legs. Patient denies any nausea vomiting, vomiting of blood, or coffee ground material in the vomitus. Patient denies any stomach pain. Patient states has had normal bowel movements no significant constipation or diarrhea. Patient denies any dysuria, hematuria or significant nocturia.  Patient denies any problems walking, swelling in the joints or loss of balance. Patient denies any skin changes, loss of hair or loss of weight. Patient denies any excessive worrying or anxiety or significant depression. Patient denies any problems with insomnia. Patient denies excessive thirst, polyuria, polydipsia. Patient denies any swollen glands, patient denies easy bruising or easy bleeding. Patient denies any recent infections, allergies or URI. Patient "s visual fields have not changed significantly in recent time.   PHYSICAL EXAM: BP (!) 193/98   Pulse 84   Temp 98.9 F (37.2 C)   Resp (!) 22   Ht 5' 0.5" (1.537 m)   Wt 256 lb 12.8 oz (116.5 kg)   LMP  (LMP Unknown)   BMI 49.33 kg/m  Patient is obese.  She has a incision in the left breast which is healing although the area is bandaged and still draining somewhat.  No other dominant masses noted in either breast no axillary or supraclavicular adenopathy is identified.  Well-developed well-nourished patient in NAD. HEENT reveals PERLA, EOMI, discs not visualized.  Oral cavity is clear. No oral mucosal lesions  are identified. Neck is clear without evidence of cervical or supraclavicular adenopathy. Lungs are clear to A&P. Cardiac examination is essentially unremarkable with regular rate and rhythm without murmur rub or thrill. Abdomen is benign with no organomegaly or masses noted. Motor sensory and DTR levels are equal and symmetric in the upper and lower extremities. Cranial nerves II through XII are grossly intact. Proprioception is intact. No peripheral adenopathy or edema is identified. No motor or sensory levels are noted. Crude visual fields are within normal range.  LABORATORY DATA: Pathology reports reviewed    RADIOLOGY RESULTS: Mammogram and ultrasound reviewed compatible with above-stated findings   IMPRESSION: Stage IIa invasive mammary carcinoma of the left breast status post wide local excision in 61 year old female ER/PR  positive  PLAN: At this time patient is interested in and I have recommended partial breast radiation to her left breast.  We will plan on delivering 35 Gray in 10 fractions.  Would use IMRT treatment planning and delivery to spare critical structures such as her heart lungs and normal breast volume.  Risks and benefits of treatment clubbing skin reaction fatigue possible alteration of blood counts all were discussed in detail with the patient.  I have personally set up and ordered CT simulation we will target her lumpectomy cavity.  Patient knows to call with any concerns.  She seems to comprehend my treatment plan well.  I would like to take this opportunity to thank you for allowing me to participate in the care of your patient.Noreene Filbert, MD

## 2022-03-11 NOTE — Assessment & Plan Note (Signed)
Discussed with patient

## 2022-03-19 ENCOUNTER — Ambulatory Visit
Admission: RE | Admit: 2022-03-19 | Discharge: 2022-03-19 | Disposition: A | Discharge status: Home / Self Care | Payer: Federal, State, Local not specified - PPO | Source: Ambulatory Visit | Attending: Radiation Oncology | Admitting: Radiation Oncology

## 2022-03-19 DIAGNOSIS — C50512 Malignant neoplasm of lower-outer quadrant of left female breast: Secondary | ICD-10-CM | POA: Diagnosis not present

## 2022-03-19 DIAGNOSIS — Z170 Estrogen receptor positive status [ER+]: Secondary | ICD-10-CM | POA: Diagnosis not present

## 2022-03-20 ENCOUNTER — Telehealth: Payer: Self-pay

## 2022-03-20 ENCOUNTER — Other Ambulatory Visit: Payer: Self-pay | Admitting: Nurse Practitioner

## 2022-03-20 NOTE — Telephone Encounter (Signed)
Dorothy Cunningham, please schedule patient for MD follow up week of 8/1. Post Radiation. Please notify patient of appt. Thanks.

## 2022-03-25 ENCOUNTER — Other Ambulatory Visit: Payer: Self-pay | Admitting: Nurse Practitioner

## 2022-03-25 NOTE — Telephone Encounter (Signed)
rx was sent to pharmacy on 10/20/21 #90/4 Requested Prescriptions  Pending Prescriptions Disp Refills  . buPROPion (WELLBUTRIN XL) 300 MG 24 hr tablet [Pharmacy Med Name: BUPROPION XL '300MG'$  TABLETS] 90 tablet 4    Sig: TAKE 1 TABLET(300 MG) BY MOUTH DAILY     Psychiatry: Antidepressants - bupropion Failed - 03/25/2022 10:37 AM      Failed - Cr in normal range and within 360 days    Creatinine, Ser  Date Value Ref Range Status  11/17/2021 1.18 (H) 0.57 - 1.00 mg/dL Final         Failed - ALT in normal range and within 360 days    ALT  Date Value Ref Range Status  10/20/2021 37 (H) 0 - 32 IU/L Final         Failed - Last BP in normal range    BP Readings from Last 1 Encounters:  03/11/22 (!) 193/98         Passed - AST in normal range and within 360 days    AST  Date Value Ref Range Status  10/20/2021 31 0 - 40 IU/L Final         Passed - Completed PHQ-2 or PHQ-9 in the last 360 days      Passed - Valid encounter within last 6 months    Recent Outpatient Visits          2 months ago Invasive carcinoma of breast ([place])   [place], Jolene T, NP   4 months ago Invasive carcinoma of breast ([place])   [place], Jolene T, NP   5 months ago Centrilobular emphysema ([place])   [place], Henrine Screws T, NP   11 months ago COVID-41   [employer], Avanti, MD   1 year ago Centrilobular emphysema ([place])   [place], Barbaraann Faster, NP      Future Appointments            In 2 months [place], Barbaraann Faster, NP [employer]

## 2022-03-26 DIAGNOSIS — C50512 Malignant neoplasm of lower-outer quadrant of left female breast: Secondary | ICD-10-CM | POA: Diagnosis not present

## 2022-03-26 DIAGNOSIS — Z170 Estrogen receptor positive status [ER+]: Secondary | ICD-10-CM | POA: Diagnosis not present

## 2022-03-27 ENCOUNTER — Other Ambulatory Visit: Payer: Self-pay | Admitting: *Deleted

## 2022-03-27 DIAGNOSIS — C50919 Malignant neoplasm of unspecified site of unspecified female breast: Secondary | ICD-10-CM

## 2022-03-30 ENCOUNTER — Ambulatory Visit: Payer: Federal, State, Local not specified - PPO

## 2022-03-30 ENCOUNTER — Other Ambulatory Visit: Payer: Self-pay | Admitting: Nurse Practitioner

## 2022-03-30 DIAGNOSIS — C50512 Malignant neoplasm of lower-outer quadrant of left female breast: Secondary | ICD-10-CM | POA: Insufficient documentation

## 2022-03-31 ENCOUNTER — Encounter: Payer: Self-pay | Admitting: Nurse Practitioner

## 2022-04-01 ENCOUNTER — Ambulatory Visit: Payer: Federal, State, Local not specified - PPO

## 2022-04-01 ENCOUNTER — Other Ambulatory Visit: Payer: Self-pay

## 2022-04-01 DIAGNOSIS — C50512 Malignant neoplasm of lower-outer quadrant of left female breast: Secondary | ICD-10-CM | POA: Diagnosis not present

## 2022-04-01 DIAGNOSIS — Z170 Estrogen receptor positive status [ER+]: Secondary | ICD-10-CM | POA: Diagnosis not present

## 2022-04-01 LAB — RAD ONC ARIA SESSION SUMMARY
Course Elapsed Days: 0
Plan Fractions Treated to Date: 1
Plan Prescribed Dose Per Fraction: 3.5 Gy
Plan Total Fractions Prescribed: 10
Plan Total Prescribed Dose: 35 Gy
Reference Point Dosage Given to Date: 3.5 Gy
Reference Point Session Dosage Given: 3.5 Gy
Session Number: 1

## 2022-04-01 MED ORDER — UMECLIDINIUM-VILANTEROL 62.5-25 MCG/ACT IN AEPB: 1.0000 | INHALATION_SPRAY | Freq: Every day | RESPIRATORY_TRACT | 4 refills | Status: DC

## 2022-04-01 NOTE — Telephone Encounter (Addendum)
refilled 04/01/22 by Marnee Guarneri NP  Requested Prescriptions  Refused Prescriptions Disp Refills   ANORO ELLIPTA 62.5-25 MCG/ACT AEPB [Pharmacy Med Name: ANORO ELLIPTA 62.5-25 ORAL INH(30S)] 60 each 4    Sig: INHALE 1 PUFF INTO THE LUNGS DAILY AT 6 AM     There is no refill protocol information for this order

## 2022-04-02 ENCOUNTER — Other Ambulatory Visit: Payer: Self-pay | Admitting: *Deleted

## 2022-04-02 ENCOUNTER — Ambulatory Visit: Payer: Federal, State, Local not specified - PPO

## 2022-04-02 ENCOUNTER — Other Ambulatory Visit: Payer: Self-pay

## 2022-04-02 DIAGNOSIS — C50512 Malignant neoplasm of lower-outer quadrant of left female breast: Secondary | ICD-10-CM | POA: Diagnosis not present

## 2022-04-02 DIAGNOSIS — Z170 Estrogen receptor positive status [ER+]: Secondary | ICD-10-CM | POA: Diagnosis not present

## 2022-04-02 LAB — RAD ONC ARIA SESSION SUMMARY
Course Elapsed Days: 1
Plan Fractions Treated to Date: 2
Plan Prescribed Dose Per Fraction: 3.5 Gy
Plan Total Fractions Prescribed: 10
Plan Total Prescribed Dose: 35 Gy
Reference Point Dosage Given to Date: 7 Gy
Reference Point Session Dosage Given: 3.5 Gy
Session Number: 2

## 2022-04-03 ENCOUNTER — Ambulatory Visit: Payer: Federal, State, Local not specified - PPO

## 2022-04-03 ENCOUNTER — Other Ambulatory Visit: Payer: Self-pay

## 2022-04-03 DIAGNOSIS — Z170 Estrogen receptor positive status [ER+]: Secondary | ICD-10-CM | POA: Diagnosis not present

## 2022-04-03 DIAGNOSIS — C50512 Malignant neoplasm of lower-outer quadrant of left female breast: Secondary | ICD-10-CM | POA: Diagnosis not present

## 2022-04-03 LAB — RAD ONC ARIA SESSION SUMMARY
Course Elapsed Days: 2
Plan Fractions Treated to Date: 3
Plan Prescribed Dose Per Fraction: 3.5 Gy
Plan Total Fractions Prescribed: 10
Plan Total Prescribed Dose: 35 Gy
Reference Point Dosage Given to Date: 10.5 Gy
Reference Point Session Dosage Given: 3.5 Gy
Session Number: 3

## 2022-04-06 ENCOUNTER — Other Ambulatory Visit: Payer: Self-pay

## 2022-04-06 ENCOUNTER — Ambulatory Visit: Payer: Federal, State, Local not specified - PPO

## 2022-04-06 DIAGNOSIS — Z170 Estrogen receptor positive status [ER+]: Secondary | ICD-10-CM | POA: Diagnosis not present

## 2022-04-06 DIAGNOSIS — C50512 Malignant neoplasm of lower-outer quadrant of left female breast: Secondary | ICD-10-CM | POA: Diagnosis not present

## 2022-04-06 LAB — RAD ONC ARIA SESSION SUMMARY
Course Elapsed Days: 5
Plan Fractions Treated to Date: 4
Plan Prescribed Dose Per Fraction: 3.5 Gy
Plan Total Fractions Prescribed: 10
Plan Total Prescribed Dose: 35 Gy
Reference Point Dosage Given to Date: 14 Gy
Reference Point Session Dosage Given: 3.5 Gy
Session Number: 4

## 2022-04-07 ENCOUNTER — Ambulatory Visit
Admission: RE | Admit: 2022-04-07 | Discharge: 2022-04-07 | Disposition: A | Discharge status: Home / Self Care | Payer: Federal, State, Local not specified - PPO | Source: Ambulatory Visit | Attending: Radiation Oncology | Admitting: Radiation Oncology

## 2022-04-07 ENCOUNTER — Other Ambulatory Visit: Payer: Self-pay

## 2022-04-07 ENCOUNTER — Inpatient Hospital Stay: Payer: Federal, State, Local not specified - PPO

## 2022-04-07 DIAGNOSIS — C50912 Malignant neoplasm of unspecified site of left female breast: Secondary | ICD-10-CM | POA: Insufficient documentation

## 2022-04-07 DIAGNOSIS — C50512 Malignant neoplasm of lower-outer quadrant of left female breast: Secondary | ICD-10-CM | POA: Diagnosis not present

## 2022-04-07 DIAGNOSIS — C50919 Malignant neoplasm of unspecified site of unspecified female breast: Secondary | ICD-10-CM

## 2022-04-07 DIAGNOSIS — Z170 Estrogen receptor positive status [ER+]: Secondary | ICD-10-CM | POA: Diagnosis not present

## 2022-04-07 LAB — RAD ONC ARIA SESSION SUMMARY
Course Elapsed Days: 6
Plan Fractions Treated to Date: 5
Plan Prescribed Dose Per Fraction: 3.5 Gy
Plan Total Fractions Prescribed: 10
Plan Total Prescribed Dose: 35 Gy
Reference Point Dosage Given to Date: 17.5 Gy
Reference Point Session Dosage Given: 3.5 Gy
Session Number: 5

## 2022-04-07 LAB — CBC
HCT: 42 % (ref 36.0–46.0)
Hemoglobin: 14.3 g/dL (ref 12.0–15.0)
MCH: 32.3 pg (ref 26.0–34.0)
MCHC: 34 g/dL (ref 30.0–36.0)
MCV: 94.8 fL (ref 80.0–100.0)
Platelets: 281 10*3/uL (ref 150–400)
RBC: 4.43 MIL/uL (ref 3.87–5.11)
RDW: 12.9 % (ref 11.5–15.5)
WBC: 9.9 10*3/uL (ref 4.0–10.5)
nRBC: 0 % (ref 0.0–0.2)

## 2022-04-08 ENCOUNTER — Other Ambulatory Visit: Payer: Self-pay

## 2022-04-08 ENCOUNTER — Ambulatory Visit
Admission: RE | Admit: 2022-04-08 | Discharge: 2022-04-08 | Disposition: A | Discharge status: Home / Self Care | Payer: Federal, State, Local not specified - PPO | Source: Ambulatory Visit | Attending: Radiation Oncology | Admitting: Radiation Oncology

## 2022-04-08 DIAGNOSIS — C50512 Malignant neoplasm of lower-outer quadrant of left female breast: Secondary | ICD-10-CM | POA: Diagnosis not present

## 2022-04-08 DIAGNOSIS — Z170 Estrogen receptor positive status [ER+]: Secondary | ICD-10-CM | POA: Diagnosis not present

## 2022-04-08 LAB — RAD ONC ARIA SESSION SUMMARY
Course Elapsed Days: 7
Plan Fractions Treated to Date: 6
Plan Prescribed Dose Per Fraction: 3.5 Gy
Plan Total Fractions Prescribed: 10
Plan Total Prescribed Dose: 35 Gy
Reference Point Dosage Given to Date: 21 Gy
Reference Point Session Dosage Given: 3.5 Gy
Session Number: 6

## 2022-04-09 ENCOUNTER — Other Ambulatory Visit: Payer: Self-pay

## 2022-04-09 ENCOUNTER — Ambulatory Visit
Admission: RE | Admit: 2022-04-09 | Discharge: 2022-04-09 | Disposition: A | Discharge status: Home / Self Care | Payer: Federal, State, Local not specified - PPO | Source: Ambulatory Visit | Attending: Radiation Oncology | Admitting: Radiation Oncology

## 2022-04-09 DIAGNOSIS — Z170 Estrogen receptor positive status [ER+]: Secondary | ICD-10-CM | POA: Diagnosis not present

## 2022-04-09 DIAGNOSIS — C50512 Malignant neoplasm of lower-outer quadrant of left female breast: Secondary | ICD-10-CM | POA: Diagnosis not present

## 2022-04-09 LAB — RAD ONC ARIA SESSION SUMMARY
Course Elapsed Days: 8
Plan Fractions Treated to Date: 7
Plan Prescribed Dose Per Fraction: 3.5 Gy
Plan Total Fractions Prescribed: 10
Plan Total Prescribed Dose: 35 Gy
Reference Point Dosage Given to Date: 24.5 Gy
Reference Point Session Dosage Given: 3.5 Gy
Session Number: 7

## 2022-04-10 ENCOUNTER — Other Ambulatory Visit: Payer: Self-pay

## 2022-04-10 ENCOUNTER — Ambulatory Visit
Admission: RE | Admit: 2022-04-10 | Discharge: 2022-04-10 | Disposition: A | Discharge status: Home / Self Care | Payer: Federal, State, Local not specified - PPO | Source: Ambulatory Visit | Attending: Radiation Oncology | Admitting: Radiation Oncology

## 2022-04-10 DIAGNOSIS — C50512 Malignant neoplasm of lower-outer quadrant of left female breast: Secondary | ICD-10-CM | POA: Diagnosis not present

## 2022-04-10 DIAGNOSIS — Z170 Estrogen receptor positive status [ER+]: Secondary | ICD-10-CM | POA: Diagnosis not present

## 2022-04-10 LAB — RAD ONC ARIA SESSION SUMMARY
Course Elapsed Days: 9
Plan Fractions Treated to Date: 8
Plan Prescribed Dose Per Fraction: 3.5 Gy
Plan Total Fractions Prescribed: 10
Plan Total Prescribed Dose: 35 Gy
Reference Point Dosage Given to Date: 28 Gy
Reference Point Session Dosage Given: 3.5 Gy
Session Number: 8

## 2022-04-13 ENCOUNTER — Other Ambulatory Visit: Payer: Self-pay

## 2022-04-13 ENCOUNTER — Ambulatory Visit
Admission: RE | Admit: 2022-04-13 | Discharge: 2022-04-13 | Disposition: A | Discharge status: Home / Self Care | Payer: Federal, State, Local not specified - PPO | Source: Ambulatory Visit | Attending: Radiation Oncology | Admitting: Radiation Oncology

## 2022-04-13 DIAGNOSIS — C50512 Malignant neoplasm of lower-outer quadrant of left female breast: Secondary | ICD-10-CM | POA: Diagnosis not present

## 2022-04-13 DIAGNOSIS — Z170 Estrogen receptor positive status [ER+]: Secondary | ICD-10-CM | POA: Diagnosis not present

## 2022-04-13 LAB — RAD ONC ARIA SESSION SUMMARY
Course Elapsed Days: 12
Plan Fractions Treated to Date: 9
Plan Prescribed Dose Per Fraction: 3.5 Gy
Plan Total Fractions Prescribed: 10
Plan Total Prescribed Dose: 35 Gy
Reference Point Dosage Given to Date: 31.5 Gy
Reference Point Session Dosage Given: 3.5 Gy
Session Number: 9

## 2022-04-14 ENCOUNTER — Other Ambulatory Visit: Payer: Self-pay

## 2022-04-14 ENCOUNTER — Ambulatory Visit
Admission: RE | Admit: 2022-04-14 | Discharge: 2022-04-14 | Disposition: A | Discharge status: Home / Self Care | Payer: Federal, State, Local not specified - PPO | Source: Ambulatory Visit | Attending: Radiation Oncology | Admitting: Radiation Oncology

## 2022-04-14 DIAGNOSIS — Z170 Estrogen receptor positive status [ER+]: Secondary | ICD-10-CM | POA: Diagnosis not present

## 2022-04-14 DIAGNOSIS — C50512 Malignant neoplasm of lower-outer quadrant of left female breast: Secondary | ICD-10-CM | POA: Diagnosis not present

## 2022-04-14 LAB — RAD ONC ARIA SESSION SUMMARY
Course Elapsed Days: 13
Plan Fractions Treated to Date: 10
Plan Prescribed Dose Per Fraction: 3.5 Gy
Plan Total Fractions Prescribed: 10
Plan Total Prescribed Dose: 35 Gy
Reference Point Dosage Given to Date: 35 Gy
Reference Point Session Dosage Given: 3.5 Gy
Session Number: 10

## 2022-04-28 ENCOUNTER — Inpatient Hospital Stay: Payer: Federal, State, Local not specified - PPO | Attending: Oncology | Admitting: Oncology

## 2022-04-28 ENCOUNTER — Encounter: Payer: Self-pay | Admitting: Oncology

## 2022-04-28 VITALS — BP 191/90 | HR 89 | Temp 98.5°F | Wt 258.0 lb

## 2022-04-28 DIAGNOSIS — Z8349 Family history of other endocrine, nutritional and metabolic diseases: Secondary | ICD-10-CM | POA: Insufficient documentation

## 2022-04-28 DIAGNOSIS — E785 Hyperlipidemia, unspecified: Secondary | ICD-10-CM | POA: Diagnosis not present

## 2022-04-28 DIAGNOSIS — Z803 Family history of malignant neoplasm of breast: Secondary | ICD-10-CM | POA: Insufficient documentation

## 2022-04-28 DIAGNOSIS — Z8249 Family history of ischemic heart disease and other diseases of the circulatory system: Secondary | ICD-10-CM | POA: Diagnosis not present

## 2022-04-28 DIAGNOSIS — C50919 Malignant neoplasm of unspecified site of unspecified female breast: Secondary | ICD-10-CM | POA: Diagnosis not present

## 2022-04-28 DIAGNOSIS — Z170 Estrogen receptor positive status [ER+]: Secondary | ICD-10-CM | POA: Insufficient documentation

## 2022-04-28 DIAGNOSIS — Z79811 Long term (current) use of aromatase inhibitors: Secondary | ICD-10-CM | POA: Insufficient documentation

## 2022-04-28 DIAGNOSIS — Z836 Family history of other diseases of the respiratory system: Secondary | ICD-10-CM | POA: Diagnosis not present

## 2022-04-28 DIAGNOSIS — I129 Hypertensive chronic kidney disease with stage 1 through stage 4 chronic kidney disease, or unspecified chronic kidney disease: Secondary | ICD-10-CM | POA: Diagnosis not present

## 2022-04-28 DIAGNOSIS — C50812 Malignant neoplasm of overlapping sites of left female breast: Secondary | ICD-10-CM | POA: Insufficient documentation

## 2022-04-28 DIAGNOSIS — Z833 Family history of diabetes mellitus: Secondary | ICD-10-CM | POA: Diagnosis not present

## 2022-04-28 DIAGNOSIS — Z87891 Personal history of nicotine dependence: Secondary | ICD-10-CM | POA: Insufficient documentation

## 2022-04-28 DIAGNOSIS — G4730 Sleep apnea, unspecified: Secondary | ICD-10-CM | POA: Diagnosis not present

## 2022-04-28 DIAGNOSIS — Z8261 Family history of arthritis: Secondary | ICD-10-CM | POA: Insufficient documentation

## 2022-04-28 DIAGNOSIS — Z79899 Other long term (current) drug therapy: Secondary | ICD-10-CM | POA: Diagnosis not present

## 2022-04-28 DIAGNOSIS — Z880 Allergy status to penicillin: Secondary | ICD-10-CM | POA: Insufficient documentation

## 2022-04-28 DIAGNOSIS — N1831 Chronic kidney disease, stage 3a: Secondary | ICD-10-CM | POA: Diagnosis not present

## 2022-04-28 MED ORDER — ANASTROZOLE 1 MG PO TABS: 1.0000 mg | ORAL_TABLET | Freq: Every day | ORAL | 2 refills | Status: DC

## 2022-04-28 NOTE — Assessment & Plan Note (Signed)
Avoid nephrotoxins.  Encourage oral hydration  

## 2022-04-28 NOTE — Progress Notes (Signed)
Hematology/Oncology progress note [phone] [phone]         Patient Care Team: Venita Lick, NP as PCP - General (Nurse Practitioner) Theodore Demark, RN (Inactive) as Oncology Nurse Navigator  ASSESSMENT & PLAN:   Cancer Staging  Invasive carcinoma of breast [facility]) Staging form: Breast, AJCC 8th Edition - Clinical stage from 11/14/2021: Stage IIA (cT2, cN0, cM0, G3, ER+, PR+, HER2-) - Signed by Earlie Server, MD on 04/28/2022    Invasive carcinoma of breast ([place]) pT2 pN0 ER+, PR+, HER- Oncotype Dx 14, no adjuvant chemotherapy benefit. S/p adjuvant Radiation.  Recommend DEXA scan for baseline bone density evaluation. Rationale of using aromatase inhibitor -Arimidex  discussed with patient.  Side effects of Arimidex including but not limited to hot flush, joint pain, fatigue, mood swing, osteoporosis discussed with patient. Patient voices understanding and willing to proceed.  Recommend calcium and vitamin D supplementation  Stage 3a chronic kidney disease (HCC) Avoid nephrotoxins.  Encourage oral hydration   Orders Placed This Encounter  Procedures   CBC with Differential/Platelet    Standing Status:   Future    Standing Expiration Date:   04/29/2023   Comprehensive metabolic panel    Standing Status:   Future    Standing Expiration Date:   04/29/2023   Follow-up 3 months.  All questions were answered. The patient knows to call the clinic with any problems, questions or concerns.  Earlie Server, MD, PhD [facility] Hematology Oncology 04/28/2022   CHIEF COMPLAINTS/REASON FOR VISIT:  Follow up for left breast cancer  HISTORY OF PRESENTING ILLNESS:   Dorothy Cunningham is a  61 y.o.  female with PMH listed below was seen for follow-up of left breast cancer Oncological history summary as below. Oncology History  Invasive carcinoma of breast ([place])  11/14/2021 Initial Diagnosis   Invasive carcinoma of breast [facility]) Patient felt a left breast  mass. 10/30/2021, bilateral diagnostic mammogram showed 21 mm mass in the left breast, 5:00, 9 cm from nipple.  No suspicious left axillary lymphadenopathy.  No mammographic evidence of malignancy in the right breast.   11/06/2021, left breast ultrasound-guided biopsy showed invasive mammary carcinoma, no special type.  Grade 3, DCIS not identified, LVI not identified.  ER+/PR +HER2 - Menarche 61 years of age, patient has 3 children.  She was 43 years old when she gave birth to her first child.  Short-term OCP use from [id number].  Patient has a history of hysterectomy 1990.  She denies family history of breast cancer. Denies any hormone replacement therapy.  Denies any chest radiation.  She recalls left breast biopsy in 2012.  She did not know details of the pathology.   11/14/2021 Cancer Staging   Staging form: Breast, AJCC 8th Edition - Clinical stage from 11/14/2021: Stage IIA (cT2, cN0, cM0, G3, ER+, PR+, HER2-) - Signed by Earlie Server, MD on 04/28/2022 Stage prefix: Initial diagnosis Histologic grading system: 3 grade system   01/26/2022 Surgery   Left breast lumpectomy and sentinel lymph node biopsy. Invasive mammary carcinoma, DCIS, 3 lymph nodes negative for malignancy.  Grade 3, all margins negative for invasive carcinoma.  Distance from closest margin 4 mm.  All margins negative for DCIS. pT2 pN0   01/26/2022 Oncotype testing   Oncotype DX breast recurrence score  14   04/01/2022 - 04/14/2022 Radiation Therapy   Adjuvant left breast radiation    Patient reports feeling well.  She tolerates radiation and has finished.    Review of Systems  Constitutional:  Negative for appetite change, chills, fatigue and fever.  HENT:   Negative for hearing loss and voice change.   Eyes:  Negative for eye problems.  Respiratory:  Negative for chest tightness and cough.   Cardiovascular:  Negative for chest pain.  Gastrointestinal:  Negative for abdominal distention, abdominal pain and blood in stool.   Endocrine: Negative for hot flashes.  Genitourinary:  Negative for difficulty urinating and frequency.   Musculoskeletal:  Negative for arthralgias.  Skin:  Negative for itching and rash.  Neurological:  Negative for extremity weakness.  Hematological:  Negative for adenopathy.  Psychiatric/Behavioral:  Negative for confusion.     MEDICAL HISTORY:  Past Medical History:  Diagnosis Date   Allergic rhinitis    Anxiety    COPD (chronic obstructive pulmonary disease) ([place])    History of epilepsy    as a teenager   Hyperlipidemia    Hypertension    Sleep apnea     SURGICAL HISTORY: Past Surgical History:  Procedure Laterality Date   Bladder Tuck  1995   BREAST BIOPSY Left 2014   benign   BREAST BIOPSY Left 11/06/2021   u/s bx  5 o'clock, VENUS clip- INVASIVE MAMMARY CARCINOMA,   BREAST LUMPECTOMY WITH SENTINEL LYMPH NODE BIOPSY Left 01/26/2022   Procedure: BREAST LUMPECTOMY WITH SENTINEL LYMPH NODE BX;  Surgeon: Robert Bellow, MD;  Location: [facility] ORS;  Service: General;  Laterality: Left;   PARTIAL HYSTERECTOMY  1995   due to heavy periods    SOCIAL HISTORY: Social History   Socioeconomic History   Marital status: Married    Spouse name: Charles   Number of children: 4   Years of education: Not on file   Highest education level: Not on file  Occupational History   Not on file  Tobacco Use   Smoking status: Former    Packs/day: 0.75    Years: 40.00    Total pack years: 30.00    Types: Cigarettes    Quit date: 09/28/2014    Years since quitting: 7.5   Smokeless tobacco: Never  Vaping Use   Vaping Use: Former  Substance and Sexual Activity   Alcohol use: No   Drug use: No   Sexual activity: Yes  Other Topics Concern   Not on file  Social History Narrative   Not on file   Social Determinants of Health   Financial Resource Strain: Low Risk  (12/01/2021)   Overall Financial Resource Strain (CARDIA)    Difficulty of Paying Living Expenses: Not hard at all   Food Insecurity: No Food Insecurity (12/01/2021)   Hunger Vital Sign    Worried About Running Out of Food in the Last Year: Never true    [place] in the Last Year: Never true  Transportation Needs: No Transportation Needs (12/01/2021)   PRAPARE - Hydrologist (Medical): No    Lack of Transportation (Non-Medical): No  Physical Activity: Inactive (12/01/2021)   Exercise Vital Sign    Days of Exercise per Week: 0 days    Minutes of Exercise per Session: 0 min  Stress: Stress Concern Present (12/01/2021)   [place]    Feeling of Stress : To some extent  Social Connections: Moderately Integrated (12/01/2021)   Social Connection and Isolation Panel [NHANES]    Frequency of Communication with Friends and Family: Twice a week    Frequency of Social Gatherings with Friends  and Family: Once a week    Attends Religious Services: 1 to 4 times per year    Active Member of Clubs or Organizations: No    Attends Archivist Meetings: Never    Marital Status: Married  Human resources officer Violence: Not At Risk (12/01/2021)   Humiliation, Afraid, Rape, and Kick questionnaire    Fear of Current or Ex-Partner: No    Emotionally Abused: No    Physically Abused: No    Sexually Abused: No    FAMILY HISTORY: Family History  Problem Relation Age of Onset   Thyroid disease Mother    Asthma Mother    Osteoarthritis Father    CAD Father    Diabetes Maternal Uncle    Diabetes Maternal Grandfather    Tuberculosis Maternal Grandfather    Diabetes Paternal Grandmother    Heart attack Paternal Grandfather    Breast cancer Other        great grandmother    ALLERGIES:  is allergic to penicillins.  MEDICATIONS:  Current Outpatient Medications  Medication Sig Dispense Refill   albuterol (VENTOLIN HFA) 108 (90 Base) MCG/ACT inhaler INHALE 2 PUFFS INTO THE LUNGS EVERY 6 HOURS AS NEEDED FOR WHEEZING OR  SHORTNESS OF BREATH 6.7 g 0   anastrozole (ARIMIDEX) 1 MG tablet Take 1 tablet (1 mg total) by mouth daily. 30 tablet 2   aspirin 81 MG EC tablet Take 81 mg by mouth daily.     atorvastatin (LIPITOR) 20 MG tablet TAKE 1 TABLET(20 MG) BY MOUTH DAILY 90 tablet 2   b complex vitamins capsule Take 1 capsule by mouth daily.     buPROPion (WELLBUTRIN XL) 300 MG 24 hr tablet TAKE 1 TABLET(300 MG) BY MOUTH DAILY 90 tablet 4   Cholecalciferol (VITAMIN D) 50 MCG (2000 UT) CAPS Take 4,000 Units by mouth daily.     diclofenac Sodium (VOLTAREN) 1 % GEL APPLY 2 GRAMS TOPICALLY TO THE AFFECTED AREA FOUR TIMES DAILY 100 g 0   diphenhydrAMINE (BENADRYL) 25 MG tablet Take 25 mg by mouth daily.     fexofenadine (ALLEGRA ALLERGY) 180 MG tablet Take 1 tablet (180 mg total) by mouth daily. 10 tablet 1   fluticasone (FLONASE) 50 MCG/ACT nasal spray SHAKE LIQUID AND USE 2 SPRAYS IN EACH NOSTRIL DAILY 48 g 6   Garlic 10 MG CAPS Take 20 mg by mouth daily.     losartan (COZAAR) 100 MG tablet Take 1 tablet (100 mg total) by mouth daily. 90 tablet 4   Multiple Vitamin (MULTIVITAMIN) tablet Take 1 tablet by mouth daily.     nystatin ointment (MYCOSTATIN) APPLY TOPICALLY TO THE AFFECTED AREA THREE TIMES DAILY (Patient taking differently: Apply 1 application  topically daily as needed (rash).) 60 g 3   Omega-3 Fatty Acids (FISH OIL PO) Take 1 capsule by mouth daily.     umeclidinium-vilanterol (ANORO ELLIPTA) 62.5-25 MCG/ACT AEPB Inhale 1 puff into the lungs daily at 6 (six) AM. 60 each 4   Glucosamine-Chondroit-Vit C-Mn (GLUCOSAMINE CHONDR 500 COMPLEX) CAPS Take 1 capsule by mouth daily. (Patient not taking: Reported on 03/11/2022)     HYDROcodone-acetaminophen (NORCO/VICODIN) 5-325 MG tablet Take 1 tablet by mouth every 4 (four) hours as needed for moderate pain. 15 tablet 0   No current facility-administered medications for this visit.     PHYSICAL EXAMINATION: ECOG PERFORMANCE STATUS: 0 - Asymptomatic Vitals:    04/28/22 1451  BP: (!) 191/90  Pulse: 89  Temp: 98.5 F (36.9 C)   Filed  Weights   04/28/22 1451  Weight: 258 lb (117 kg)    Physical Exam Constitutional:      General: She is not in acute distress. HENT:     Head: Normocephalic and atraumatic.  Eyes:     General: No scleral icterus. Cardiovascular:     Rate and Rhythm: Normal rate and regular rhythm.     Heart sounds: Normal heart sounds.  Pulmonary:     Effort: Pulmonary effort is normal. No respiratory distress.     Breath sounds: No wheezing.  Abdominal:     General: Bowel sounds are normal. There is no distension.     Palpations: Abdomen is soft.  Musculoskeletal:        General: No deformity. Normal range of motion.     Cervical back: Normal range of motion and neck supple.  Skin:    General: Skin is warm and dry.     Findings: No erythema or rash.  Neurological:     Mental Status: She is alert and oriented to person, place, and time. Mental status is at baseline.     Cranial Nerves: No cranial nerve deficit.     Coordination: Coordination normal.  Psychiatric:        Mood and Affect: Mood normal.     LABORATORY DATA:  I have reviewed the data as listed     Latest Ref Rng & Units 04/07/2022    2:30 PM 11/14/2021   12:08 PM 10/20/2021    3:52 PM  CBC  WBC 4.0 - 10.5 K/uL 9.9  9.3  10.9   Hemoglobin 12.0 - 15.0 g/dL 14.3  15.1  15.9   Hematocrit 36.0 - 46.0 % 42.0  43.8  45.7   Platelets 150 - 400 K/uL 281  307  326       Latest Ref Rng & Units 11/17/2021    4:18 PM 10/20/2021    3:52 PM 11/01/2020    4:12 PM  CMP  Glucose 70 - 99 mg/dL 89  91  92   BUN 8 - 27 mg/dL _0 Creatinine 0.57 - 1.00 mg/dL 1.18  1.27  1.25   Sodium 134 - 144 mmol/L 141  144  141   Potassium 3.5 - 5.2 mmol/L 3.7  4.0  4.0   Chloride 96 - 106 mmol/L 102  101  106   CO2 20 - 29 mmol/L _1 Calcium 8.7 - 10.3 mg/dL 10.1  10.0  9.5   Total Protein 6.0 - 8.5 g/dL  7.4    Total Bilirubin 0.0 - 1.2 mg/dL  0.4     Alkaline Phos 44 - 121 IU/L  70    AST 0 - 40 IU/L  31    ALT 0 - 32 IU/L  37        RADIOGRAPHIC STUDIES: I have personally reviewed the radiological images as listed and agreed with the findings in the report. No results found.

## 2022-04-28 NOTE — Assessment & Plan Note (Addendum)
Stage IIA left breast cancer, pT2 pN0 ER+, PR+, HER- Oncotype Dx 14, no adjuvant chemotherapy benefit. S/p adjuvant Radiation.  Recommend DEXA scan for baseline bone density evaluation. Rationale of using aromatase inhibitor -Arimidex  discussed with patient.  Side effects of Arimidex including but not limited to hot flush, joint pain, fatigue, mood swing, osteoporosis discussed with patient. Patient voices understanding and willing to proceed.  Recommend calcium and vitamin D supplementation

## 2022-05-07 ENCOUNTER — Other Ambulatory Visit: Payer: Federal, State, Local not specified - PPO

## 2022-05-10 ENCOUNTER — Encounter: Payer: Self-pay | Admitting: Nurse Practitioner

## 2022-05-13 ENCOUNTER — Encounter: Payer: Self-pay | Admitting: Radiation Oncology

## 2022-05-13 ENCOUNTER — Ambulatory Visit
Admission: RE | Admit: 2022-05-13 | Discharge: 2022-05-13 | Disposition: A | Discharge status: Home / Self Care | Payer: Federal, State, Local not specified - PPO | Source: Ambulatory Visit | Attending: Radiation Oncology | Admitting: Radiation Oncology

## 2022-05-13 VITALS — BP 183/98 | HR 87 | Temp 98.4°F | Resp 20 | Ht 61.0 in | Wt 258.6 lb

## 2022-05-13 DIAGNOSIS — Z170 Estrogen receptor positive status [ER+]: Secondary | ICD-10-CM | POA: Diagnosis not present

## 2022-05-13 DIAGNOSIS — C50512 Malignant neoplasm of lower-outer quadrant of left female breast: Secondary | ICD-10-CM | POA: Insufficient documentation

## 2022-05-13 DIAGNOSIS — Z923 Personal history of irradiation: Secondary | ICD-10-CM | POA: Diagnosis not present

## 2022-05-13 DIAGNOSIS — Z79811 Long term (current) use of aromatase inhibitors: Secondary | ICD-10-CM | POA: Insufficient documentation

## 2022-05-13 DIAGNOSIS — C50919 Malignant neoplasm of unspecified site of unspecified female breast: Secondary | ICD-10-CM

## 2022-05-13 NOTE — Progress Notes (Signed)
Radiation Oncology Follow up Note  Name: Dorothy Cunningham   Date:   05/13/2022 MRN:  [id number] DOB: 1961-05-28    This 61 y.o. female presents to the clinic today for 1 month follow-up status post whole breast radiation to her left breast for stage IIa (T2 N0 M0) grade 3 ER/PR positive invasive mammary carcinoma status post wide local excision.  REFERRING PROVIDER: Venita Lick, NP  HPI: Patient is a 61 year old female now at 1 month having completed whole breast radiation to her left breast for stage IIa invasive mammary carcinoma ER/PR positive.  Seen today in routine follow-up she is doing well.  She specifically denies breast tenderness cough or bone pain.  She has been started on Arimidex tolerating it well without side effect..  COMPLICATIONS OF TREATMENT: none  FOLLOW UP COMPLIANCE: keeps appointments   PHYSICAL EXAM:  BP (!) 183/98 (BP Location: Right Wrist, Patient Position: Sitting, Cuff Size: Normal)   Pulse 87   Temp 98.4 F (36.9 C) (Tympanic)   Resp 20   Ht '5\' 1"'$  (1.549 m) Comment: Stated Ht  Wt 258 lb 9.6 oz (117.3 kg)   LMP  (LMP Unknown)   BMI 48.86 kg/m  Lungs are clear to A&P cardiac examination essentially unremarkable with regular rate and rhythm. No dominant mass or nodularity is noted in either breast in 2 positions examined. Incision is well-healed. No axillary or supraclavicular adenopathy is appreciated. Cosmetic result is excellent.  Well-developed well-nourished patient in NAD. HEENT reveals PERLA, EOMI, discs not visualized.  Oral cavity is clear. No oral mucosal lesions are identified. Neck is clear without evidence of cervical or supraclavicular adenopathy. Lungs are clear to A&P. Cardiac examination is essentially unremarkable with regular rate and rhythm without murmur rub or thrill. Abdomen is benign with no organomegaly or masses noted. Motor sensory and DTR levels are equal and symmetric in the upper and lower extremities. Cranial nerves II  through XII are grossly intact. Proprioception is intact. No peripheral adenopathy or edema is identified. No motor or sensory levels are noted. Crude visual fields are within normal range.  RADIOLOGY RESULTS: No current films for review  PLAN: Present time patient is doing well very low side effect profile 1 month out from whole breast radiation.  Of asked to see her back in 5 months for follow-up.  She continues on Arimidex without side effect.  Patient is to call with any concerns.  I would like to take this opportunity to thank you for allowing me to participate in the care of your patient.Noreene Filbert, MD

## 2022-05-23 NOTE — Patient Instructions (Signed)
Eating Plan for Chronic Obstructive Pulmonary Disease Chronic obstructive pulmonary disease (COPD) causes symptoms such as shortness of breath, coughing, and chest discomfort. These symptoms can make it difficult to eat enough to maintain a healthy weight. Generally, people with COPD should eat a diet that is high in calories, protein, and other nutrients to maintain body weight and to keep the lungs as healthy as possible. Depending on the medicines you take and other health conditions you may have, your health care provider may give you additional recommendations on what to eat or avoid. Talk with your health care provider about your goals for body weight, and work with a dietitian to develop an eating plan that is right for you. What are tips for following this plan? Reading food labels  Avoid foods with more than 300 milligrams (mg) of salt (sodium) per serving. Choose foods that contain at least 4 grams (g) of fiber per serving. Try to eat 20-30 g of fiber each day. Choose foods that are high in calories and protein, such as nuts, beans, yogurt, and cheese. Shopping Do not buy foods labeled as diet, low-calorie, or low-fat. If you are able to eat dairy products: Avoid low-fat or skim milk. Buy dairy products that have at least 2% fat. Buy nutritional supplement drinks. Buy grains and prepared foods labeled as enriched or fortified. Consider buying low-sodium, pre-made foods to conserve energy for eating. Cooking Add dry milk or protein powder to smoothies. Cook with healthy fats, such as olive oil, canola oil, sunflower oil, and grapeseed oil. Add oil, butter, cream cheese, or nut butters to foods to increase fat and calories. To make foods easier to chew and swallow: Cook vegetables, pasta, and rice until soft. Cut or grind meat into very small pieces. Dip breads in liquid. Meal planning  Eat when you feel hungry. Eat 5-6 small meals throughout the day. Drink 6-8 glasses of water  each day. Do not drink liquids with meals. Drink liquids at the end of the meal to avoid feeling full too quickly. Eat a variety of fruits and vegetables every day. Ask for assistance from family or friends with planning and preparing meals as needed. Avoid foods that cause you to feel bloated, such as carbonated drinks, fried foods, beans, broccoli, cabbage, and apples. For older adults, ask your local agency on aging whether you are eligible for meal assistance programs, such as Meals on Wheels. Lifestyle  Do not smoke. Eat slowly. Take small bites and chew food well before swallowing. Do not overeat. This may make it more difficult to breathe after eating. Sit up while eating. If needed, continue to use supplemental oxygen while eating. Rest or relax for 30 minutes before and after eating. Monitor your weight as told by your health care provider. Exercise as told by your health care provider. What foods should I eat? Fruits All fresh, dried, canned, or frozen fruits that do not cause gas. Vegetables All fresh, canned (no salt added), or frozen vegetables that do not cause gas. Grains Whole-grain bread. Enriched whole-grain pasta. Fortified whole-grain cereals. Fortified rice. Quinoa. Meats and other proteins Lean meat. Poultry. Fish. Dried beans. Unsalted nuts. Tofu. Eggs. Nut butters. Dairy Whole or 2% milk. Cheese. Yogurt. Fats and oils Olive oil. Canola oil. Butter. Margarine. Beverages Water. Vegetable juice (no salt added). Decaffeinated coffee. Decaffeinated or herbal tea. Seasonings and condiments Fresh or dried herbs. Low-salt or salt-free seasonings. Low-sodium soy sauce. The items listed above may not be a complete list of foods   and beverages you can eat. Contact a dietitian for more information. What foods should I avoid? Fruits Fruits that cause gas, such as apples or melon. Vegetables Vegetables that cause gas, such as broccoli, Brussels sprouts, cabbage,  cauliflower, and onions. Canned vegetables with added salt. Meats and other proteins Fried meat. Salt-cured meat. Processed meat. Dairy Fat-free or low-fat milk, yogurt, or cheese. Processed cheese. Beverages Carbonated drinks. Caffeinated drinks, such as coffee, tea, and soft drinks. Juice. Alcohol. Vegetable juice with added salt. Seasonings and condiments Salt. Seasoning mixes with salt. Soy sauce. Pickles. Other foods Clear soup or broth. Fried foods. Prepared frozen meals. The items listed above may not be a complete list of foods and beverages you should avoid. Contact a dietitian for more information. Summary COPD symptoms can make it difficult to eat enough to maintain a healthy weight. A COPD eating plan can help you maintain your body weight and keep your lungs as healthy as possible. Eat a diet that is high in calories, protein, and other nutrients. Read labels to make sure that you are getting the right nutrients. Cook foods to make them easier to chew and swallow. Eat 5-6 small meals throughout the day, and avoid foods that cause gas or make you feel bloated. This information is not intended to replace advice given to you by your health care provider. Make sure you discuss any questions you have with your health care provider. Document Revised: 07/23/2020 Document Reviewed: 07/23/2020 Elsevier Patient Education  2023 Elsevier Inc.  

## 2022-05-24 ENCOUNTER — Other Ambulatory Visit: Payer: Self-pay | Admitting: Nurse Practitioner

## 2022-05-25 ENCOUNTER — Other Ambulatory Visit: Payer: Self-pay | Admitting: Nurse Practitioner

## 2022-05-25 NOTE — Telephone Encounter (Signed)
Requested Prescriptions  Pending Prescriptions Disp Refills  . diclofenac Sodium (VOLTAREN) 1 % GEL [Pharmacy Med Name: DICLOFENAC 1% GEL 100GM (RX)] 100 g 0    Sig: APPLY 2 GRAMS TOPICALLY TO THE AFFECTED AREA FOUR TIMES DAILY     Analgesics:  Topicals Failed - 05/24/2022  7:40 AM      Failed - Manual Review: Labs are only required if the patient has taken medication for more than 8 weeks.      Failed - Cr in normal range and within 360 days    Creatinine, Ser  Date Value Ref Range Status  11/17/2021 1.18 (H) 0.57 - 1.00 mg/dL Final         Passed - PLT in normal range and within 360 days    Platelets  Date Value Ref Range Status  04/07/2022 281 150 - 400 K/uL Final  10/20/2021 326 150 - 450 x10E3/uL Final         Passed - HGB in normal range and within 360 days    Hemoglobin  Date Value Ref Range Status  04/07/2022 14.3 12.0 - 15.0 g/dL Final  10/20/2021 15.9 11.1 - 15.9 g/dL Final         Passed - HCT in normal range and within 360 days    HCT  Date Value Ref Range Status  04/07/2022 42.0 36.0 - 46.0 % Final   Hematocrit  Date Value Ref Range Status  10/20/2021 45.7 34.0 - 46.6 % Final         Passed - eGFR is 30 or above and within 360 days    GFR calc Af Amer  Date Value Ref Range Status  11/01/2020 53 (L) >59 mL/min/1.73 Final    Comment:    **In accordance with recommendations from the NKF-ASN Task force,**   [facility] is in the process of updating its eGFR calculation to the   2021 CKD-EPI creatinine equation that estimates kidney function   without a race variable.    GFR calc non Af Amer  Date Value Ref Range Status  11/01/2020 46 (L) >59 mL/min/1.73 Final   eGFR  Date Value Ref Range Status  11/17/2021 52 (L) >59 mL/min/1.73 Final         Passed - Patient is not pregnant      Passed - Valid encounter within last 12 months    Recent Outpatient Visits          4 months ago Invasive carcinoma of breast ([place])   [place],  Jolene T, NP   6 months ago Invasive carcinoma of breast ([place])   [place], Jolene T, NP   7 months ago Centrilobular emphysema ([place])   [place], Barbaraann Faster, NP   1 year ago COVID-19   [facility] V[place], Avanti, MD   1 year ago Centrilobular emphysema ([place])   [place], Barbaraann Faster, NP      Future Appointments            Tomorrow Venita Lick, NP [place], [facility]

## 2022-05-26 ENCOUNTER — Ambulatory Visit (INDEPENDENT_AMBULATORY_CARE_PROVIDER_SITE_OTHER): Payer: Federal, State, Local not specified - PPO | Admitting: Nurse Practitioner

## 2022-05-26 ENCOUNTER — Encounter: Payer: Self-pay | Admitting: Nurse Practitioner

## 2022-05-26 VITALS — BP 148/94 | HR 92 | Temp 98.8°F | Ht 61.0 in | Wt 258.0 lb

## 2022-05-26 DIAGNOSIS — F321 Major depressive disorder, single episode, moderate: Secondary | ICD-10-CM

## 2022-05-26 DIAGNOSIS — I700 Atherosclerosis of aorta: Secondary | ICD-10-CM

## 2022-05-26 DIAGNOSIS — K760 Fatty (change of) liver, not elsewhere classified: Secondary | ICD-10-CM

## 2022-05-26 DIAGNOSIS — E782 Mixed hyperlipidemia: Secondary | ICD-10-CM | POA: Diagnosis not present

## 2022-05-26 DIAGNOSIS — N1831 Chronic kidney disease, stage 3a: Secondary | ICD-10-CM

## 2022-05-26 DIAGNOSIS — Z6841 Body Mass Index (BMI) 40.0 and over, adult: Secondary | ICD-10-CM

## 2022-05-26 DIAGNOSIS — Z23 Encounter for immunization: Secondary | ICD-10-CM

## 2022-05-26 DIAGNOSIS — R808 Other proteinuria: Secondary | ICD-10-CM

## 2022-05-26 DIAGNOSIS — M25531 Pain in right wrist: Secondary | ICD-10-CM

## 2022-05-26 DIAGNOSIS — C50919 Malignant neoplasm of unspecified site of unspecified female breast: Secondary | ICD-10-CM | POA: Diagnosis not present

## 2022-05-26 DIAGNOSIS — J432 Centrilobular emphysema: Secondary | ICD-10-CM

## 2022-05-26 DIAGNOSIS — K828 Other specified diseases of gallbladder: Secondary | ICD-10-CM

## 2022-05-26 DIAGNOSIS — Z87891 Personal history of nicotine dependence: Secondary | ICD-10-CM

## 2022-05-26 DIAGNOSIS — I10 Essential (primary) hypertension: Secondary | ICD-10-CM

## 2022-05-26 DIAGNOSIS — G4733 Obstructive sleep apnea (adult) (pediatric): Secondary | ICD-10-CM

## 2022-05-26 MED ORDER — LOSARTAN POTASSIUM 100 MG PO TABS
100.0000 mg | ORAL_TABLET | Freq: Every day | ORAL | 4 refills | Status: DC
Start: 2022-05-26 — End: 2023-07-13

## 2022-05-26 NOTE — Assessment & Plan Note (Addendum)
Chronic, ongoing with elevations.  Will increase Losartan to 100 MG daily and adjust as needed. Educated on [place] and recommend checking BP at home daily and documenting for provider visits.  Avoid ACE due to underlying lung disease.  LABS: CMP and lipid.  Consider addition of Amlodipine or Chlorthalidone if ongoing elevations.  Return in 6 weeks.

## 2022-05-26 NOTE — Telephone Encounter (Signed)
Requested medication (s) are due for refill today: yes  Requested medication (s) are on the active medication list: yes  Last refill:  03/17/21 48 g 6 RF  Future visit scheduled: yes  Notes to clinic:  pt has upcoming OV- do not see office note addressing Flonase in past 12 months   Requested Prescriptions  Pending Prescriptions Disp Refills   fluticasone (FLONASE) 50 MCG/ACT nasal spray [Pharmacy Med Name: FLUTICASONE 50MCG NASAL SP (120) RX] 48 g 6    Sig: SHAKE LIQUID AND USE 2 SPRAYS IN EACH NOSTRIL DAILY     Ear, Nose, and Throat: Nasal Preparations - Corticosteroids Passed - 05/25/2022  4:55 PM      Passed - Valid encounter within last 12 months    Recent Outpatient Visits           Today Centrilobular emphysema ([place])   [place], Jolene T, NP   4 months ago Invasive carcinoma of breast ([place])   [place], Jolene T, NP   6 months ago Invasive carcinoma of breast ([place])   [place], Jolene T, NP   7 months ago Centrilobular emphysema ([place])   [place], Barbaraann Faster, NP   1 year ago COVID-101   [place] V[place], Customer service manager, MD       Future Appointments             In 1 month Cannady, Barbaraann Faster, NP [facility]

## 2022-05-26 NOTE — Assessment & Plan Note (Signed)
Chronic, ongoing.  FEV1 62% and FEV1/FVC 81% in February 2023.  Continue Anoro (which has improved SOB) + continue Albuterol as needed, discussed at length with patient.  Continue lung screening annually, as past smoker with 30 year smoking history -- recommend she schedule this and reached out to [place] to schedule.  Refills as needed.

## 2022-05-26 NOTE — Assessment & Plan Note (Signed)
Chronic, stable.  Continue current medication regimen and adjust as needed.  Lipid panel today. 

## 2022-05-26 NOTE — Assessment & Plan Note (Signed)
BMI 48.75 with COPD and HLD.  Recommended eating smaller high protein, low fat meals more frequently and exercising 30 mins a day 5 times a week with a goal of 10-15lb weight loss in the next 3 months. Patient voiced their understanding and motivation to adhere to these recommendations.

## 2022-05-26 NOTE — Assessment & Plan Note (Signed)
Ongoing.  Noted past labs at 150 and Losartan on board, increasing dose today.  Check CMP today.

## 2022-05-26 NOTE — Assessment & Plan Note (Signed)
Continue annual lung CT screening and monitor. 

## 2022-05-26 NOTE — Assessment & Plan Note (Signed)
Noted on 04/19/20 lung screening CT.  Educated patient on this.  Focus on healthy diet and regular exercise.

## 2022-05-26 NOTE — Assessment & Plan Note (Signed)
Continue collaboration with oncology at this time.  Recent notes reviewed.

## 2022-05-26 NOTE — Assessment & Plan Note (Signed)
Acute for months with recent loss of grip strength to her dominant hand.  Will place referral to ortho to further assess and offer recommendations.  At this time continue Voltaren gel and recommend wear wrist support sleeve, especially when typing or on computer.  Ensure plenty of rest and stretching.

## 2022-05-26 NOTE — Assessment & Plan Note (Signed)
Does not use CPAP, not interested in repeat study at this time.  Plan on repeat study in future if worsening symptoms present. 

## 2022-05-26 NOTE — Progress Notes (Signed)
BP (!) 148/94 (BP Location: Left Arm, Patient Position: Sitting, Cuff Size: Large)   Pulse 92   Temp 98.8 F (37.1 C) (Oral)   Ht '5\' 1"'$  (1.549 m)   Wt 258 lb (117 kg)   LMP  (LMP Unknown)   SpO2 94%   BMI 48.75 kg/m    Subjective:    Patient ID: Dorothy Cunningham, female    DOB: [birth year], 61 y.o.   MRN: [id number]  HPI: Dorothy Cunningham is a 61 y.o. female  Chief Complaint  Patient presents with   Hyperlipidemia   Hypertension   Breast Cancer   COPD   Mood   Sleep Apnea   Wrist Problem    Patient says she thinks her wrist discomfort she is experiencing, may be coming from her using her computer mouse. Patient says she would like discuss with provider at today's visit.    HYPERTENSION / HYPERLIPIDEMIA Continues on Atorvastatin and Losartan + Omega 3 daily.  Has been having some elevations in BP at oncology visits she reports.  Diagnosed with OSA several years ago does not use CPAP, does head of bed elevation. Satisfied with current treatment? yes Duration of hypertension: chronic BP monitoring frequency: not checking BP range:  BP medication side effects: no Duration of hyperlipidemia: chronic Cholesterol medication side effects: no Cholesterol supplements: fish oil Medication compliance: good compliance Aspirin: yes Recent stressors: no Recurrent headaches: no Visual changes: no Palpitations: no Dyspnea: no Chest pain: no Lower extremity edema: no Dizzy/lightheaded: no   BREAST CANCER: Diagnosed on 11/14/21 with invasive carcinoma left breast, no lymph node involvement noted.  Seen by oncology last 04/28/22 she finished radiation treatment on 04/14/22.  Continues on Arimidex daily.  Is to have bone density on October 2nd and then will follow-up with oncology after this.  COPD Continues Anoro daily and Albuterol as needed.  Quit smoking >9 years ago, smoked cigarettes for 30 years -- started at age 3 -- smoked about 1 PPD.     Initial lung cancer  screening on 04/19/20 noted aortic atherosclerosis, centrilobular and paraseptal emphysema + 3 vessel coronary artery disease. Some hepatic steatosis and "appears to be a porcelain gallbladder". Further evaluation with nonemergent abdominal CT with and without IV contrast is recommended in the near future to better evaluate this finding".  A CT abdomen was ordered in past, but cost issue at that time and she did not attend.  Has not attended for repeat CT lung screening due to current breast cancer treatments.  She does want to return now for lung CT. COPD status: stable Satisfied with current treatment?: yes Oxygen use: no Dyspnea frequency: stable, improved with Anoro Cough frequency: with activity or if gets hot Rescue inhaler frequency: <1-2 times a week Limitation of activity: no Productive cough: no Last Spirometry:  11/17/21 FEV1 62% and FEV1/FVC 81%, previous 88% and 112% Pneumovax: Up To Date Influenza: Up to Date  CHRONIC KIDNEY DISEASE With proteinuria at 150 - taking Losartan 50 MG daily and tolerating well. CKD status: stable Medications renally dose: yes Previous renal evaluation: no Pneumovax:  Up to Date Influenza Vaccine:  Up to Date   DEPRESSION Continues on Wellbutrin for mood. Mood status: stable Satisfied with current treatment?: yes Symptom severity: mild  Duration of current treatment : chronic Side effects: no Medication compliance: good compliance Psychotherapy/counseling: none Depressed mood: no Anxious mood: no Anhedonia: no Significant weight loss or gain: no Insomnia: none Fatigue: no Feelings of worthlessness or guilt: no Impaired  concentration/indecisiveness: no Suicidal ideations: no Hopelessness: no Crying spells: no    05/26/2022   10:31 AM 12/29/2021    3:29 PM 11/17/2021    3:36 PM 10/20/2021    3:49 PM 04/17/2021    3:39 PM  Depression screen PHQ 2/9  Decreased Interest 0 0 2 0 0  Down, Depressed, Hopeless 0 0 2 0 0  PHQ - 2 Score 0 0 4  0 0  Altered sleeping 0 0 2 0   Tired, decreased energy 0 '1 2 3   '$ Change in appetite 0 '1 3 2   '$ Feeling bad or failure about yourself  0 0 0 1   Trouble concentrating 0 0 0 1   Moving slowly or fidgety/restless 0 0 0 0   Suicidal thoughts 0 0 0 0   PHQ-9 Score 0 '2 11 7   '$ Difficult doing work/chores Not difficult at all           05/26/2022   10:31 AM 12/29/2021    3:30 PM 11/17/2021    3:36 PM 10/20/2021    3:49 PM  GAD 7 : Generalized Anxiety Score  Nervous, Anxious, on Edge 0 0 2 1  Control/stop worrying 0 0 1 0  Worry too much - different things 0 0 1 0  Trouble relaxing 0 0 1 0  Restless 0 0 0 0  Easily annoyed or irritable 0 0 1 1  Afraid - awful might happen 0 0 2 0  Total GAD 7 Score 0 0 8 2  Anxiety Difficulty Not difficult at all Somewhat difficult Somewhat difficult Somewhat difficult   WRIST PAIN  Having right wrist pain, had cortisone shot in 2004 to area. Having little twinge to radial aspect of wrist, did lose some grip strength recently and dropped plate.  Right hand dominant -- types a lot and uses mouse. Duration: months Involved wrist: right Mechanism of injury:  no trauma Location: radial Onset: gradual Severity: 3/10  Quality:  dull, aching, and shooting Frequency: intermittent Radiation: no Aggravating factors: typing, movement, and gripping  Alleviating factors:  Voltaren gel Status: fluctuating Treatments attempted: Voltaren gel, Fish Oil, Tylenol    Relief with NSAIDs?:  No NSAIDs Taken Weakness: yes Numbness: no  none Redness: no Bruising: no Swelling: no Fevers: no   Relevant past medical, surgical, family and social history reviewed and updated as indicated. Interim medical history since our last visit reviewed. Allergies and medications reviewed and updated.  Review of Systems  Constitutional:  Negative for activity change, appetite change, diaphoresis, fatigue and fever.  Respiratory:  Negative for cough, chest tightness, shortness of  breath and wheezing.   Cardiovascular:  Negative for chest pain, palpitations and leg swelling.  Gastrointestinal: Negative.   Musculoskeletal:  Positive for arthralgias.  Neurological: Negative.   Psychiatric/Behavioral: Negative.      Per HPI unless specifically indicated above     Objective:    BP (!) 148/94 (BP Location: Left Arm, Patient Position: Sitting, Cuff Size: Large)   Pulse 92   Temp 98.8 F (37.1 C) (Oral)   Ht '5\' 1"'$  (1.549 m)   Wt 258 lb (117 kg)   LMP  (LMP Unknown)   SpO2 94%   BMI 48.75 kg/m   Wt Readings from Last 3 Encounters:  05/26/22 258 lb (117 kg)  05/13/22 258 lb 9.6 oz (117.3 kg)  04/28/22 258 lb (117 kg)    Physical Exam Vitals and nursing note reviewed.  Constitutional:  General: She is awake. She is not in acute distress.    Appearance: She is well-developed and well-groomed. She is morbidly obese. She is not ill-appearing.  HENT:     Head: Normocephalic.     Right Ear: Hearing normal.     Left Ear: Hearing normal.  Eyes:     General: Lids are normal.        Right eye: No discharge.        Left eye: No discharge.     Conjunctiva/sclera: Conjunctivae normal.     Pupils: Pupils are equal, round, and reactive to light.  Neck:     Thyroid: No thyromegaly.     Vascular: No carotid bruit.  Cardiovascular:     Rate and Rhythm: Normal rate and regular rhythm.     Heart sounds: Normal heart sounds. No murmur heard.    No gallop.  Pulmonary:     Effort: Pulmonary effort is normal. No accessory muscle usage or respiratory distress.     Breath sounds: Normal breath sounds.  Abdominal:     General: Bowel sounds are normal.     Palpations: Abdomen is soft.  Musculoskeletal:     Right wrist: Tenderness (to medial upper aspect along tendon) present. No swelling, bony tenderness, snuff box tenderness or crepitus. Normal range of motion. Normal pulse.     Left wrist: Normal.     Cervical back: Normal range of motion and neck supple.      Right lower leg: No edema.     Left lower leg: No edema.     Comments: Decreased grip strength to right hand on exam  Skin:    General: Skin is warm and dry.  Neurological:     Mental Status: She is alert and oriented to person, place, and time.  Psychiatric:        Attention and Perception: Attention normal.        Mood and Affect: Mood normal.        Speech: Speech normal.        Behavior: Behavior normal. Behavior is cooperative.        Thought Content: Thought content normal.    Results for orders placed or performed in visit on 04/14/22  Rad Onc [facility] Session Summary  Result Value Ref Range   Course ID [id number]    Course Intent Curative    Course Start Date 03/19/2022 11:47 AM    Session Number 10    Course First Treatment Date 04/01/2022  3:10 PM    Course Last Treatment Date 04/14/2022  3:06 PM    Course Elapsed Days 13    Reference Point ID Lt Breast DP    Reference Point Dosage Given to Date 35 Gy   Reference Point Session Dosage Given 3.5 Gy   Plan ID Breast_L_APBI    Plan Fractions Treated to Date 10    Plan Total Fractions Prescribed 10    Plan Prescribed Dose Per Fraction 3.5 Gy   Plan Total Prescribed Dose 35.000000 Gy   Plan Primary Reference Point Lt Breast DP       Assessment & Plan:   Problem List Items Addressed This Visit       Cardiovascular and Mediastinum   Aortic atherosclerosis ([place])    Ongoing. On 04/19/20 lung screening CT, along with 3 vessel coronary artery disease.  Educated patient on this and recommend continue statin daily +  ASA 81 MG daily for prevention.  Continue cessation of smoking.  Focus on  healthy diet and regular exercise.      Relevant Medications   losartan (COZAAR) 100 MG tablet   Other Relevant Orders   Comprehensive metabolic panel   Lipid Panel w/o Chol/HDL Ratio   Essential hypertension    Chronic, ongoing with elevations.  Will increase Losartan to 100 MG daily and adjust as needed. Educated on [place] and recommend  checking BP at home daily and documenting for provider visits.  Avoid ACE due to underlying lung disease.  LABS: CMP and lipid.  Consider addition of Amlodipine or Chlorthalidone if ongoing elevations.  Return in 6 weeks.      Relevant Medications   losartan (COZAAR) 100 MG tablet   Other Relevant Orders   Comprehensive metabolic panel     Respiratory   Centrilobular emphysema (HCC) - Primary    Chronic, ongoing.  FEV1 62% and FEV1/FVC 81% in February 2023.  Continue Anoro (which has improved SOB) + continue Albuterol as needed, discussed at length with patient.  Continue lung screening annually, as past smoker with 30 year smoking history -- recommend she schedule this and reached out to [place] to schedule.  Refills as needed.        Sleep apnea    Does not use CPAP, not interested in repeat study at this time.  Plan on repeat study in future if worsening symptoms present.        Digestive   Hepatic steatosis    Noted on 04/19/20 lung screening CT.  Educated patient on this.  Focus on healthy diet and regular exercise.      Porcelain gallbladder    Possible noted on imaging 04/19/20, was to have a non emergent CT abdomen with and without IV contrast, was ordered but she did not obtain due to cost.  Has missed repeat lung screening.  At this time she wishes to return for her repeat lung screening, will reach out to team to schedule.        Genitourinary   Stage 3a chronic kidney disease (HCC) (Chronic)    Ongoing with urine ALB 150, continue ARB for kidney protection.  Recheck CMP today.  Avoid ACE due to COPD.  Consider nephrology referral if decline in future.      Relevant Orders   Comprehensive metabolic panel     Other   Invasive carcinoma of breast ([place]) (Chronic)    Continue collaboration with oncology at this time.  Recent notes reviewed.      Acute pain of right wrist    Acute for months with recent loss of grip strength to her dominant hand.  Will place referral to  ortho to further assess and offer recommendations.  At this time continue Voltaren gel and recommend wear wrist support sleeve, especially when typing or on computer.  Ensure plenty of rest and stretching.      Relevant Orders   Ambulatory referral to Orthopedics   BMI 50.0-59.9, adult [facility])    Refer to Morbid Obesity plan of care.      Depression    Chronic, stable on Wellbutrin.  Continue current regimen and adjust as needed.  Denies SI/HI.  Refills as needed.      History of smoking    Continue annual lung CT screening and monitor.      Hyperlipidemia    Chronic, stable.  Continue current medication regimen and adjust as needed.  Lipid panel today.      Relevant Medications   losartan (COZAAR) 100 MG tablet  Other Relevant Orders   Comprehensive metabolic panel   Lipid Panel w/o Chol/HDL Ratio   Morbid obesity (HCC)    BMI 48.75 with COPD and HLD.  Recommended eating smaller high protein, low fat meals more frequently and exercising 30 mins a day 5 times a week with a goal of 10-15lb weight loss in the next 3 months. Patient voiced their understanding and motivation to adhere to these recommendations.       Other proteinuria    Ongoing.  Noted past labs at 150 and Losartan on board, increasing dose today.  Check CMP today.      Other Visit Diagnoses     Flu vaccine need       Flu shot in office today.   Relevant Orders   Flu Vaccine QUAD 6+ mos PF IM (Fluarix Quad PF) (Completed)        Follow up plan: Return in about 6 weeks (around 07/07/2022) for HTN -- increased Losartan to 100 MG.

## 2022-05-26 NOTE — Assessment & Plan Note (Signed)
Possible noted on imaging 04/19/20, was to have a non emergent CT abdomen with and without IV contrast, was ordered but she did not obtain due to cost.  Has missed repeat lung screening.  At this time she wishes to return for her repeat lung screening, will reach out to team to schedule.

## 2022-05-26 NOTE — Assessment & Plan Note (Signed)
Chronic, stable on Wellbutrin.  Continue current regimen and adjust as needed.  Denies SI/HI.  Refills as needed. 

## 2022-05-26 NOTE — Assessment & Plan Note (Signed)
Ongoing. On 04/19/20 lung screening CT, along with 3 vessel coronary artery disease.  Educated patient on this and recommend continue statin daily +  ASA 81 MG daily for prevention.  Continue cessation of smoking.  Focus on healthy diet and regular exercise.

## 2022-05-26 NOTE — Assessment & Plan Note (Signed)
Ongoing with urine ALB 150, continue ARB for kidney protection.  Recheck CMP today.  Avoid ACE due to COPD.  Consider nephrology referral if decline in future.

## 2022-05-26 NOTE — Assessment & Plan Note (Signed)
Refer to Morbid Obesity plan of care. 

## 2022-05-29 LAB — LIPID PANEL W/O CHOL/HDL RATIO
Cholesterol, Total: 177 mg/dL (ref 100–199)
HDL: 41 mg/dL (ref >39)
LDL Chol Calc (NIH): 91 mg/dL (ref 0–99)
Triglycerides: 267 mg/dL — ABNORMAL HIGH (ref 0–149)
VLDL Cholesterol Cal: 45 mg/dL — ABNORMAL HIGH (ref 5–40)

## 2022-05-29 LAB — COMPREHENSIVE METABOLIC PANEL
ALT: 54 IU/L — ABNORMAL HIGH (ref 0–32)
AST: 36 IU/L (ref 0–40)
Albumin/Globulin Ratio: 1.6 (ref 1.2–2.2)
Albumin: 4.5 g/dL (ref 3.9–4.9)
Alkaline Phosphatase: 70 IU/L (ref 44–121)
BUN/Creatinine Ratio: 13 (ref 12–28)
BUN: 15 mg/dL (ref 8–27)
Bilirubin Total: 0.3 mg/dL (ref 0.0–1.2)
CO2: 20 mmol/L (ref 20–29)
Calcium: 9.6 mg/dL (ref 8.7–10.3)
Chloride: 99 mmol/L (ref 96–106)
Creatinine, Ser: 1.2 mg/dL — ABNORMAL HIGH (ref 0.57–1.00)
Globulin, Total: 2.9 g/dL (ref 1.5–4.5)
Glucose: 94 mg/dL (ref 70–99)
Potassium: 4 mmol/L (ref 3.5–5.2)
Sodium: 139 mmol/L (ref 134–144)
Total Protein: 7.4 g/dL (ref 6.0–8.5)
eGFR: 51 mL/min/{1.73_m2} — ABNORMAL LOW (ref >59)

## 2022-05-29 NOTE — Progress Notes (Signed)
Contacted via [place] afternoon Manus Gunning, your labs have returned: - - Kidney function, creatinine and eGFR, remains showing mild kidney disease but no worsening -- we will continue to monitor this. - Liver function, AST and ALT, show mild elevation ALT.  Monitor diet closely, healthier options and regular activity.  We will continue to monitor on labs. - Cholesterol panel remains stable, would like to see LDL under 70 in future an may adjust statin next visit.  Triglycerides are a little elevated, recommend continue fish oil daily.  Any questions? Keep being amazing!!  Thank you for allowing me to participate in your care.  I appreciate you. Kindest regards, Lakedra Washington

## 2022-06-02 ENCOUNTER — Other Ambulatory Visit: Payer: Self-pay | Admitting: *Deleted

## 2022-06-02 DIAGNOSIS — Z122 Encounter for screening for malignant neoplasm of respiratory organs: Secondary | ICD-10-CM

## 2022-06-02 DIAGNOSIS — Z87891 Personal history of nicotine dependence: Secondary | ICD-10-CM

## 2022-06-12 DIAGNOSIS — M654 Radial styloid tenosynovitis [de Quervain]: Secondary | ICD-10-CM | POA: Diagnosis not present

## 2022-06-17 ENCOUNTER — Ambulatory Visit
Admission: RE | Admit: 2022-06-17 | Discharge: 2022-06-17 | Disposition: A | Discharge status: Home / Self Care | Payer: Federal, State, Local not specified - PPO | Source: Ambulatory Visit | Attending: Acute Care | Admitting: Acute Care

## 2022-06-17 DIAGNOSIS — Z122 Encounter for screening for malignant neoplasm of respiratory organs: Secondary | ICD-10-CM | POA: Diagnosis not present

## 2022-06-17 DIAGNOSIS — Z87891 Personal history of nicotine dependence: Secondary | ICD-10-CM | POA: Insufficient documentation

## 2022-06-19 ENCOUNTER — Other Ambulatory Visit: Payer: Self-pay

## 2022-06-19 ENCOUNTER — Encounter: Payer: Self-pay | Admitting: Nurse Practitioner

## 2022-06-19 DIAGNOSIS — N261 Atrophy of kidney (terminal): Secondary | ICD-10-CM | POA: Insufficient documentation

## 2022-06-19 DIAGNOSIS — Z122 Encounter for screening for malignant neoplasm of respiratory organs: Secondary | ICD-10-CM

## 2022-06-19 DIAGNOSIS — Z87891 Personal history of nicotine dependence: Secondary | ICD-10-CM

## 2022-06-26 ENCOUNTER — Other Ambulatory Visit: Payer: Self-pay | Admitting: Nurse Practitioner

## 2022-06-26 NOTE — Telephone Encounter (Signed)
Requested Prescriptions  Pending Prescriptions Disp Refills  . diclofenac Sodium (VOLTAREN) 1 % GEL [Pharmacy Med Name: DICLOFENAC 1% GEL 100GM (RX)] 100 g 0    Sig: APPLY 2 GRAMS TOPICALLY TO THE AFFECTED AREA FOUR TIMES DAILY     Analgesics:  Topicals Failed - 06/26/2022  4:43 AM      Failed - Manual Review: Labs are only required if the patient has taken medication for more than 8 weeks.      Failed - Cr in normal range and within 360 days    Creatinine, Ser  Date Value Ref Range Status  05/26/2022 1.20 (H) 0.57 - 1.00 mg/dL Final         Passed - PLT in normal range and within 360 days    Platelets  Date Value Ref Range Status  04/07/2022 281 150 - 400 K/uL Final  10/20/2021 326 150 - 450 x10E3/uL Final         Passed - HGB in normal range and within 360 days    Hemoglobin  Date Value Ref Range Status  04/07/2022 14.3 12.0 - 15.0 g/dL Final  10/20/2021 15.9 11.1 - 15.9 g/dL Final         Passed - HCT in normal range and within 360 days    HCT  Date Value Ref Range Status  04/07/2022 42.0 36.0 - 46.0 % Final   Hematocrit  Date Value Ref Range Status  10/20/2021 45.7 34.0 - 46.6 % Final         Passed - eGFR is 30 or above and within 360 days    GFR calc Af Amer  Date Value Ref Range Status  11/01/2020 53 (L) >59 mL/min/1.73 Final    Comment:    **In accordance with recommendations from the NKF-ASN Task force,**   [facility] is in the process of updating its eGFR calculation to the   2021 CKD-EPI creatinine equation that estimates kidney function   without a race variable.    GFR calc non Af Amer  Date Value Ref Range Status  11/01/2020 46 (L) >59 mL/min/1.73 Final   eGFR  Date Value Ref Range Status  05/26/2022 51 (L) >59 mL/min/1.73 Final         Passed - Patient is not pregnant      Passed - Valid encounter within last 12 months    Recent Outpatient Visits          1 month ago Centrilobular emphysema ([place])   [place], Jolene T,  NP   5 months ago Invasive carcinoma of breast ([place])   [place], Jolene T, NP   7 months ago Invasive carcinoma of breast ([place])   [place], Jolene T, NP   8 months ago Centrilobular emphysema ([place])   [place], Barbaraann Faster, NP   1 year ago COVID-94   [place], MD      Future Appointments            In 1 week Cannady, Barbaraann Faster, NP [employer] [facility]

## 2022-06-29 ENCOUNTER — Ambulatory Visit
Admission: RE | Admit: 2022-06-29 | Discharge: 2022-06-29 | Disposition: A | Discharge status: Home / Self Care | Payer: Federal, State, Local not specified - PPO | Source: Ambulatory Visit | Attending: Oncology | Admitting: Oncology

## 2022-06-29 DIAGNOSIS — C50919 Malignant neoplasm of unspecified site of unspecified female breast: Secondary | ICD-10-CM | POA: Insufficient documentation

## 2022-06-29 DIAGNOSIS — E559 Vitamin D deficiency, unspecified: Secondary | ICD-10-CM | POA: Insufficient documentation

## 2022-06-29 DIAGNOSIS — Z780 Asymptomatic menopausal state: Secondary | ICD-10-CM | POA: Diagnosis not present

## 2022-07-05 NOTE — Patient Instructions (Signed)

## 2022-07-07 ENCOUNTER — Encounter: Payer: Self-pay | Admitting: Oncology

## 2022-07-07 ENCOUNTER — Encounter: Payer: Self-pay | Admitting: Nurse Practitioner

## 2022-07-07 ENCOUNTER — Ambulatory Visit (INDEPENDENT_AMBULATORY_CARE_PROVIDER_SITE_OTHER): Payer: Federal, State, Local not specified - PPO | Admitting: Nurse Practitioner

## 2022-07-07 VITALS — BP 142/84 | HR 89 | Temp 98.1°F | Ht 61.0 in | Wt 261.3 lb

## 2022-07-07 DIAGNOSIS — K828 Other specified diseases of gallbladder: Secondary | ICD-10-CM

## 2022-07-07 DIAGNOSIS — N1831 Chronic kidney disease, stage 3a: Secondary | ICD-10-CM | POA: Diagnosis not present

## 2022-07-07 DIAGNOSIS — I10 Essential (primary) hypertension: Secondary | ICD-10-CM

## 2022-07-07 DIAGNOSIS — N261 Atrophy of kidney (terminal): Secondary | ICD-10-CM

## 2022-07-07 DIAGNOSIS — R808 Other proteinuria: Secondary | ICD-10-CM

## 2022-07-07 MED ORDER — AMLODIPINE BESYLATE 2.5 MG PO TABS: 2.5000 mg | ORAL_TABLET | Freq: Every day | ORAL | 4 refills | Status: DC

## 2022-07-07 NOTE — Progress Notes (Signed)
BP (!) 142/84 (BP Location: Left Arm, Patient Position: Sitting, Cuff Size: Large)   Pulse 89   Temp 98.1 F (36.7 C) (Oral)   Ht '5\' 1"'  (1.549 m)   Wt 261 lb 4.8 oz (118.5 kg)   LMP  (LMP Unknown)   SpO2 97%   BMI 49.37 kg/m    Subjective:    Patient ID: Dorothy Cunningham, female    DOB: [birth year], 61 y.o.   MRN: [id number]  HPI: Dorothy Cunningham is a 61 y.o. female  Chief Complaint  Patient presents with   Hypertension    Patient is here for six week follow up on Hypertension and start of medication. Patient says she thinks the Losartan medication is working.    HYPERTENSION  Losartan increased last visit to 100 MG daily, is tolerating. Previously BP has run 191/90 on checks.  Recent CT lung screening noting chronic porcelain gallbladder with recommendation for general surgery and chronic severe asymmetric left renal atrophy -- with underlying CKD 3a on labs. Hypertension status: stable  Satisfied with current treatment? yes Duration of hypertension: chronic BP monitoring frequency:  not checking BP range:  BP medication side effects:  no Medication compliance: good compliance Previous BP meds: Losartan Aspirin: yes Recurrent headaches: no Visual changes: no Palpitations: no Dyspnea: no Chest pain: no Lower extremity edema: no Dizzy/lightheaded: no   Relevant past medical, surgical, family and social history reviewed and updated as indicated. Interim medical history since our last visit reviewed. Allergies and medications reviewed and updated.  Review of Systems  Constitutional:  Negative for activity change, appetite change, diaphoresis, fatigue and fever.  Respiratory:  Negative for cough, chest tightness, shortness of breath and wheezing.   Cardiovascular:  Negative for chest pain, palpitations and leg swelling.  Gastrointestinal: Negative.   Neurological: Negative.   Psychiatric/Behavioral: Negative.      Per HPI unless specifically indicated  above     Objective:    BP (!) 142/84 (BP Location: Left Arm, Patient Position: Sitting, Cuff Size: Large)   Pulse 89   Temp 98.1 F (36.7 C) (Oral)   Ht '5\' 1"'  (1.549 m)   Wt 261 lb 4.8 oz (118.5 kg)   LMP  (LMP Unknown)   SpO2 97%   BMI 49.37 kg/m   Wt Readings from Last 3 Encounters:  07/07/22 261 lb 4.8 oz (118.5 kg)  05/26/22 258 lb (117 kg)  05/13/22 258 lb 9.6 oz (117.3 kg)    Physical Exam Vitals and nursing note reviewed.  Constitutional:      General: She is awake. She is not in acute distress.    Appearance: She is well-developed and well-groomed. She is morbidly obese. She is not ill-appearing.  HENT:     Head: Normocephalic.     Right Ear: Hearing normal.     Left Ear: Hearing normal.  Eyes:     General: Lids are normal.        Right eye: No discharge.        Left eye: No discharge.     Conjunctiva/sclera: Conjunctivae normal.     Pupils: Pupils are equal, round, and reactive to light.  Neck:     Thyroid: No thyromegaly.     Vascular: No carotid bruit.  Cardiovascular:     Rate and Rhythm: Normal rate and regular rhythm.     Heart sounds: Normal heart sounds. No murmur heard.    No gallop.  Pulmonary:     Effort: Pulmonary effort is  normal. No accessory muscle usage or respiratory distress.     Breath sounds: Normal breath sounds.  Abdominal:     General: Bowel sounds are normal.     Palpations: Abdomen is soft.  Musculoskeletal:     Cervical back: Normal range of motion and neck supple.     Right lower leg: No edema.     Left lower leg: No edema.  Skin:    General: Skin is warm and dry.  Neurological:     Mental Status: She is alert and oriented to person, place, and time.  Psychiatric:        Attention and Perception: Attention normal.        Mood and Affect: Mood normal.        Speech: Speech normal.        Behavior: Behavior normal. Behavior is cooperative.        Thought Content: Thought content normal.     Results for orders placed or  performed in visit on 05/26/22  Comprehensive metabolic panel  Result Value Ref Range   Glucose 94 70 - 99 mg/dL   BUN 15 8 - 27 mg/dL   Creatinine, Ser 1.20 (H) 0.57 - 1.00 mg/dL   eGFR 51 (L) >59 mL/min/1.73   BUN/Creatinine Ratio 13 12 - 28   Sodium 139 134 - 144 mmol/L   Potassium 4.0 3.5 - 5.2 mmol/L   Chloride 99 96 - 106 mmol/L   CO2 20 20 - 29 mmol/L   Calcium 9.6 8.7 - 10.3 mg/dL   Total Protein 7.4 6.0 - 8.5 g/dL   Albumin 4.5 3.9 - 4.9 g/dL   Globulin, Total 2.9 1.5 - 4.5 g/dL   Albumin/Globulin Ratio 1.6 1.2 - 2.2   Bilirubin Total 0.3 0.0 - 1.2 mg/dL   Alkaline Phosphatase 70 44 - 121 IU/L   AST 36 0 - 40 IU/L   ALT 54 (H) 0 - 32 IU/L  Lipid Panel w/o Chol/HDL Ratio  Result Value Ref Range   Cholesterol, Total 177 100 - 199 mg/dL   Triglycerides 267 (H) 0 - 149 mg/dL   HDL 41 >39 mg/dL   VLDL Cholesterol Cal 45 (H) 5 - 40 mg/dL   LDL Chol Calc (NIH) 91 0 - 99 mg/dL      Assessment & Plan:   Problem List Items Addressed This Visit       Cardiovascular and Mediastinum   Essential hypertension    Chronic and improving, but not at goal of <130/80.  Educated on [place] and recommend checking BP at home daily and documenting for provider visits. Continue Losartan daily, which is beneficial for proteinuria (80) as well and add on 2.5 MG Amlodipine to regimen daily, educated her on this and possible side effects.  Avoid ACE due to underlying lung disease. Referral to nephrology placed.      Relevant Medications   amLODipine (NORVASC) 2.5 MG tablet   Other Relevant Orders   Basic metabolic panel     Digestive   Porcelain gallbladder    Possible noted on imaging 04/19/20 and recent CT lung.  Referral to general surgery placed.      Relevant Orders   Ambulatory referral to General Surgery     Genitourinary   Stage 3a chronic kidney disease ([place]) - Primary (Chronic)    Ongoing with urine ALB 80, continue ARB for kidney protection.  Recheck CMP today.  Avoid  ACE due to COPD.  Nephrology referral placed due to CKD  and renal atrophy.      Relevant Orders   Ambulatory referral to Nephrology   Left renal atrophy    Noted on lung CA screening 06/19/22, has underlying CKD 3a.  Will place referral to nephrology for further recommendations.      Relevant Orders   Ambulatory referral to Nephrology     Other   Morbid obesity ([place])    BMI 49.37.  Recommended eating smaller high protein, low fat meals more frequently and exercising 30 mins a day 5 times a week with a goal of 10-15lb weight loss in the next 3 months. Patient voiced their understanding and motivation to adhere to these recommendations.       Other proteinuria    Ongoing.  Noted past labs at 62 to 150 and Losartan on board.  Check CMP today. Referral to nephrology.        Follow up plan: Return in about 4 weeks (around 08/04/2022) for HTN and CKD.

## 2022-07-07 NOTE — Assessment & Plan Note (Signed)
Possible noted on imaging 04/19/20 and recent CT lung.  Referral to general surgery placed.

## 2022-07-07 NOTE — Assessment & Plan Note (Signed)
Ongoing.  Noted past labs at 14 to 150 and Losartan on board.  Check CMP today. Referral to nephrology.

## 2022-07-07 NOTE — Assessment & Plan Note (Signed)
Noted on lung CA screening 06/19/22, has underlying CKD 3a.  Will place referral to nephrology for further recommendations.

## 2022-07-07 NOTE — Assessment & Plan Note (Signed)
Ongoing with urine ALB 80, continue ARB for kidney protection.  Recheck CMP today.  Avoid ACE due to COPD.  Nephrology referral placed due to CKD and renal atrophy.

## 2022-07-07 NOTE — Assessment & Plan Note (Signed)
Chronic and improving, but not at goal of <130/80.  Educated on [place] and recommend checking BP at home daily and documenting for provider visits. Continue Losartan daily, which is beneficial for proteinuria (80) as well and add on 2.5 MG Amlodipine to regimen daily, educated her on this and possible side effects.  Avoid ACE due to underlying lung disease. Referral to nephrology placed.

## 2022-07-07 NOTE — Assessment & Plan Note (Signed)
BMI 49.37.  Recommended eating smaller high protein, low fat meals more frequently and exercising 30 mins a day 5 times a week with a goal of 10-15lb weight loss in the next 3 months. Patient voiced their understanding and motivation to adhere to these recommendations.

## 2022-07-08 LAB — BASIC METABOLIC PANEL
BUN/Creatinine Ratio: 16 (ref 12–28)
BUN: 19 mg/dL (ref 8–27)
CO2: 20 mmol/L (ref 20–29)
Calcium: 10 mg/dL (ref 8.7–10.3)
Chloride: 103 mmol/L (ref 96–106)
Creatinine, Ser: 1.19 mg/dL — ABNORMAL HIGH (ref 0.57–1.00)
Glucose: 86 mg/dL (ref 70–99)
Potassium: 4 mmol/L (ref 3.5–5.2)
Sodium: 140 mmol/L (ref 134–144)
eGFR: 51 mL/min/{1.73_m2} — ABNORMAL LOW (ref >59)

## 2022-07-08 NOTE — Progress Notes (Signed)
Contacted via [place] morning Dorothy Cunningham, your labs have returned and kidney function continues to show kidney disease stage 3a with no worsening.  Overall remaining stable.  We will see if the kidney provider has any recommendations for this or the renal findings on imaging.  Have a great day!! Keep being amazing!!  Thank you for allowing me to participate in your care.  I appreciate you. Kindest regards, Canna Nickelson

## 2022-07-10 ENCOUNTER — Encounter: Payer: Self-pay | Admitting: Nurse Practitioner

## 2022-07-13 NOTE — Progress Notes (Unsigned)
Patient ID: Dorothy Cunningham, female   DOB: 1960-12-27, 61 y.o.   MRN: [id number]  Chief Complaint:  ***  History of Present Illness Dorothy Cunningham is a 61 y.o. female with ***.  Past Medical History Past Medical History:  Diagnosis Date   Allergic rhinitis    Anxiety    COPD (chronic obstructive pulmonary disease) (HCC)    History of epilepsy    as a teenager   Hyperlipidemia    Hypertension    Sleep apnea       Past Surgical History:  Procedure Laterality Date   Bladder Tuck  1995   BREAST BIOPSY Left 2014   benign   BREAST BIOPSY Left 11/06/2021   u/s bx  5 o'clock, VENUS clip- INVASIVE MAMMARY CARCINOMA,   BREAST LUMPECTOMY WITH SENTINEL LYMPH NODE BIOPSY Left 01/26/2022   Procedure: BREAST LUMPECTOMY WITH SENTINEL LYMPH NODE BX;  Surgeon: Robert Bellow, MD;  Location: [facility] ORS;  Service: General;  Laterality: Left;   PARTIAL HYSTERECTOMY  1995   due to heavy periods    Allergies  Allergen Reactions   Penicillins Hives    Current Outpatient Medications  Medication Sig Dispense Refill   albuterol (VENTOLIN HFA) 108 (90 Base) MCG/ACT inhaler INHALE 2 PUFFS INTO THE LUNGS EVERY 6 HOURS AS NEEDED FOR WHEEZING OR SHORTNESS OF BREATH 6.7 g 0   amLODipine (NORVASC) 2.5 MG tablet Take 1 tablet (2.5 mg total) by mouth daily. 45 tablet 4   anastrozole (ARIMIDEX) 1 MG tablet Take 1 tablet (1 mg total) by mouth daily. 30 tablet 2   aspirin 81 MG EC tablet Take 81 mg by mouth daily.     atorvastatin (LIPITOR) 20 MG tablet TAKE 1 TABLET(20 MG) BY MOUTH DAILY 90 tablet 2   b complex vitamins capsule Take 1 capsule by mouth daily.     buPROPion (WELLBUTRIN XL) 300 MG 24 hr tablet TAKE 1 TABLET(300 MG) BY MOUTH DAILY 90 tablet 4   Calcium-Magnesium-Vitamin D (CALCIUM 1200+D3 PO)      Cholecalciferol (VITAMIN D) 50 MCG (2000 UT) CAPS Take 4,000 Units by mouth daily.     diclofenac Sodium (VOLTAREN) 1 % GEL APPLY 2 GRAMS TOPICALLY TO THE AFFECTED AREA FOUR TIMES DAILY  100 g 0   diphenhydrAMINE (BENADRYL) 25 MG tablet Take 25 mg by mouth daily.     fexofenadine (ALLEGRA ALLERGY) 180 MG tablet Take 1 tablet (180 mg total) by mouth daily. 10 tablet 1   fluticasone (FLONASE) 50 MCG/ACT nasal spray SHAKE LIQUID AND USE 2 SPRAYS IN EACH NOSTRIL DAILY 48 g 6   Garlic 10 MG CAPS Take 20 mg by mouth daily.     losartan (COZAAR) 100 MG tablet Take 1 tablet (100 mg total) by mouth daily. 90 tablet 4   Multiple Vitamin (MULTIVITAMIN) tablet Take 1 tablet by mouth daily.     nystatin ointment (MYCOSTATIN) APPLY TOPICALLY TO THE AFFECTED AREA THREE TIMES DAILY (Patient taking differently: Apply 1 application  topically daily as needed (rash).) 60 g 3   Omega-3 Fatty Acids (FISH OIL PO) Take 1 capsule by mouth daily.     umeclidinium-vilanterol (ANORO ELLIPTA) 62.5-25 MCG/ACT AEPB Inhale 1 puff into the lungs daily at 6 (six) AM. 60 each 4   No current facility-administered medications for this visit.    Family History Family History  Problem Relation Age of Onset   Thyroid disease Mother    Asthma Mother    Osteoarthritis Father    CAD  Father    Diabetes Maternal Uncle    Diabetes Maternal Grandfather    Tuberculosis Maternal Grandfather    Diabetes Paternal Grandmother    Heart attack Paternal Grandfather    Breast cancer Other        great grandmother      Social History Social History   Tobacco Use   Smoking status: Former    Packs/day: 0.75    Years: 40.00    Total pack years: 30.00    Types: Cigarettes    Quit date: 09/28/2014    Years since quitting: 7.7   Smokeless tobacco: Never  Vaping Use   Vaping Use: Former  Substance Use Topics   Alcohol use: No   Drug use: No        ROS ***   Physical Exam There were no vitals taken for this visit.   CONSTITUTIONAL: Well developed, and nourished, appropriately responsive and aware without distress. ***  EYES: Sclera non-icteric.   EARS, NOSE, MOUTH AND THROAT: Mask worn.  *** The  oropharynx is clear. Oral mucosa is pink and moist.  Dentition: ***   Hearing is intact to voice.  NECK: Trachea is midline, and there is no jugular venous distension.  LYMPH NODES:  Lymph nodes in the neck are not enlarged. RESPIRATORY:  Lungs are clear, and breath sounds are equal bilaterally. Normal respiratory effort without pathologic use of accessory muscles. CARDIOVASCULAR: Heart is regular in rate and rhythm. GI: The abdomen is *** soft, nontender, and nondistended. There were no palpable masses. I did not appreciate hepatosplenomegaly. There were normal bowel sounds. GU: *** MUSCULOSKELETAL:  Symmetrical muscle tone appreciated in all four extremities.    SKIN: Skin turgor is normal. No pathologic skin lesions appreciated.  NEUROLOGIC:  Motor and sensation appear grossly normal.  Cranial nerves are grossly without defect. PSYCH:  Alert and oriented to person, place and time. Affect is appropriate for situation.  Data Reviewed I have personally reviewed what is currently available of the patient's imaging, recent labs and medical records.   Labs:     Latest Ref Rng & Units 04/07/2022    2:30 PM 11/14/2021   12:08 PM 10/20/2021    3:52 PM  CBC  WBC 4.0 - 10.5 K/uL 9.9  9.3  10.9   Hemoglobin 12.0 - 15.0 g/dL 14.3  15.1  15.9   Hematocrit 36.0 - 46.0 % 42.0  43.8  45.7   Platelets 150 - 400 K/uL 281  307  326       Latest Ref Rng & Units 07/07/2022    3:21 PM 05/26/2022   11:22 AM 11/17/2021    4:18 PM  CMP  Glucose 70 - 99 mg/dL 86  94  89   BUN 8 - 27 mg/dL '19  15  10   '$ Creatinine 0.57 - 1.00 mg/dL 1.19  1.20  1.18   Sodium 134 - 144 mmol/L 140  139  141   Potassium 3.5 - 5.2 mmol/L 4.0  4.0  3.7   Chloride 96 - 106 mmol/L 103  99  102   CO2 20 - 29 mmol/L '20  20  24   '$ Calcium 8.7 - 10.3 mg/dL 10.0  9.6  10.1   Total Protein 6.0 - 8.5 g/dL  7.4    Total Bilirubin 0.0 - 1.2 mg/dL  0.3    Alkaline Phos 44 - 121 IU/L  70    AST 0 - 40 IU/L  36    ALT 0 -  32 IU/L  54      ***   Imaging: Radiology review:  CLINICAL DATA:  61 year old asymptomatic female former smoker with 30 pack-year smoking history, quit smoking in 2016. History of left breast cancer treated with conservation therapy completed July 2023.   * Tracking Code: BO *   EXAM: CT CHEST WITHOUT CONTRAST LOW-DOSE FOR LUNG CANCER SCREENING   TECHNIQUE: Multidetector CT imaging of the chest was performed following the standard protocol without IV contrast.   RADIATION DOSE REDUCTION: This exam was performed according to the departmental dose-optimization program which includes automated exposure control, adjustment of the mA and/or kV according to patient size and/or use of iterative reconstruction technique.   COMPARISON:  04/19/2020 screening chest CT.   FINDINGS: Cardiovascular: Normal heart size. No significant pericardial effusion/thickening. Three-vessel coronary atherosclerosis. Atherosclerotic nonaneurysmal thoracic aorta. Normal caliber pulmonary arteries.   Mediastinum/Nodes: No significant thyroid nodules. Unremarkable esophagus. No pathologically enlarged axillary, mediastinal or hilar lymph nodes, noting limited sensitivity for the detection of hilar adenopathy on this noncontrast study.   Lungs/Pleura: No pneumothorax. No pleural effusion. Mild centrilobular emphysema with diffuse bronchial wall thickening. No acute consolidative airspace disease or lung masses. No significant growth of previously visualized pulmonary nodules. No new significant pulmonary nodules.   Upper abdomen: Diffuse coarse calcification throughout the wall of the gallbladder, compatible with porcelain gallbladder, unchanged. Diffuse hepatic steatosis. Severe asymmetric left renal atrophy is unchanged. Multiple simple exophytic lateral left renal cysts, largest 3.0 cm, for which no follow-up imaging is recommended.   Musculoskeletal: No aggressive appearing focal osseous lesions. Moderate  thoracic spondylosis.   IMPRESSION: 1. Lung-RADS 2, benign appearance or behavior. Continue annual screening with low-dose chest CT without contrast in 12 months. 2. Chronic porcelain gallbladder. General surgical consultation may be considered. 3. Three-vessel coronary atherosclerosis. 4. Diffuse hepatic steatosis. 5. Chronic severe asymmetric left renal atrophy. 6. Aortic Atherosclerosis (ICD10-I70.0) and Emphysema (ICD10-J43.9).     Electronically Signed   By: Ilona Sorrel M.D.   On: 06/18/2022 15:29 Within last 24 hrs: No results found.   Assessment    *** Patient Active Problem List   Diagnosis Date Noted   Left renal atrophy 06/19/2022   Porcelain gallbladder 12/29/2021   Other proteinuria 11/17/2021   Invasive carcinoma of breast ([place]) 11/14/2021   Goals of care, counseling/discussion 11/14/2021   Aortic atherosclerosis ([place]) 05/23/2020   Hepatic steatosis 05/23/2020   Genetic disorder 05/23/2020   BMI 50.0-59.9, adult ([place]) 04/03/2020   History of smoking 04/03/2020   Morbid obesity ([place]) 12/02/2018   Essential hypertension 12/02/2018   Stage 3a chronic kidney disease ([place]) 12/01/2018   Vitamin D deficiency 05/23/2018   Depression    Allergic rhinitis    Centrilobular emphysema ([place])    Hyperlipidemia    Sleep apnea     Plan    ***  Face-to-face time spent with the patient and accompanying care providers(if present) was *** minutes, with more than 50% of the time spent counseling, educating, and coordinating care of the patient.    These notes generated with voice recognition software. I apologize for typographical errors.  Ronny Bacon M.D., FACS 07/13/2022, 10:05 AM

## 2022-07-14 ENCOUNTER — Encounter: Payer: Self-pay | Admitting: Surgery

## 2022-07-14 ENCOUNTER — Ambulatory Visit: Payer: Federal, State, Local not specified - PPO | Admitting: Surgery

## 2022-07-14 ENCOUNTER — Other Ambulatory Visit: Payer: Self-pay

## 2022-07-14 VITALS — BP 180/102 | HR 95 | Temp 98.0°F | Ht 61.0 in | Wt 257.0 lb

## 2022-07-14 DIAGNOSIS — K828 Other specified diseases of gallbladder: Secondary | ICD-10-CM

## 2022-07-14 NOTE — Patient Instructions (Signed)
Please call the office when you decide to move forward with having your Gallbladder removed.    Minimally Invasive Cholecystectomy Minimally invasive cholecystectomy is surgery to remove the gallbladder. The gallbladder is a pear-shaped organ that lies beneath the liver on the right side of the body. The gallbladder stores bile, which is a fluid that helps the body digest fats. Cholecystectomy is often done to treat inflammation (irritation and swelling) of the gallbladder (cholecystitis). This condition is usually caused by a buildup of gallstones (cholelithiasis) in the gallbladder or when the fluid in the gall bladder becomes stagnant because gallstones get stuck in the ducts (tubes) and block the flow of bile. This can result in inflammation and pain. In severe cases, emergency surgery may be required. This procedure is done through small incisions in the abdomen, instead of one large incision. It is also called laparoscopic surgery. A thin scope with a camera (laparoscope) is inserted through one incision. Then surgical instruments are inserted through the other incisions. In some cases, a minimally invasive surgery may need to be changed to a surgery that is done through a larger incision. This is called open surgery. Tell a health care provider about: Any allergies you have. All medicines you are taking, including vitamins, herbs, eye drops, creams, and over-the-counter medicines. Any problems you or family members have had with anesthetic medicines. Any bleeding problems you have. Any surgeries you have had. Any medical conditions you have. Whether you are pregnant or may be pregnant. What are the risks? Generally, this is a safe procedure. However, problems may occur, including: Infection. Bleeding. Allergic reactions to medicines. Damage to nearby structures or organs. A gallstone remaining in the common bile duct. The common bile duct carries bile from the gallbladder to the small  intestine. A bile leak from the liver or cystic duct after your gallbladder is removed. What happens before the procedure? When to stop eating and drinking Follow instructions from your health care provider about what you may eat and drink before your procedure. These may include: 8 hours before the procedure Stop eating most foods. Do not eat meat, fried foods, or fatty foods. Eat only light foods, such as toast or crackers. All liquids are okay except energy drinks and alcohol. 6 hours before the procedure Stop eating. Drink only clear liquids, such as water, clear fruit juice, black coffee, plain tea, and sports drinks. Do not drink energy drinks or alcohol. 2 hours before the procedure Stop drinking all liquids. You may be allowed to take medicines with small sips of water. If you do not follow your health care provider's instructions, your procedure may be delayed or canceled. Medicines Ask your health care provider about: Changing or stopping your regular medicines. This is especially important if you are taking diabetes medicines or blood thinners. Taking medicines such as aspirin and ibuprofen. These medicines can thin your blood. Do not take these medicines unless your health care provider tells you to take them. Taking over-the-counter medicines, vitamins, herbs, and supplements. General instructions If you will be going home right after the procedure, plan to have a responsible adult: Take you home from the hospital or clinic. You will not be allowed to drive. Care for you for the time you are told. Do not use any products that contain nicotine or tobacco for at least 4 weeks before the procedure. These products include cigarettes, chewing tobacco, and vaping devices, such as e-cigarettes. If you need help quitting, ask your health care provider. Ask your  health care provider: How your surgery site will be marked. What steps will be taken to help prevent infection. These may  include: Removing hair at the surgery site. Washing skin with a germ-killing soap. Taking antibiotic medicine. What happens during the procedure?  An IV will be inserted into one of your veins. You will be given one or both of the following: A medicine to help you relax (sedative). A medicine to make you fall asleep (general anesthetic). Your surgeon will make several small incisions in your abdomen. The laparoscope will be inserted through one of the small incisions. The camera on the laparoscope will send images to a monitor in the operating room. This lets your surgeon see inside your abdomen. A gas will be pumped into your abdomen. This will expand your abdomen to give the surgeon more room to perform the surgery. Other tools that are needed for the procedure will be inserted through the other incisions. The gallbladder will be removed through one of the incisions. Your common bile duct may be examined. If stones are found in the common bile duct, they may be removed. After your gallbladder has been removed, the incisions will be closed with stitches (sutures), staples, or skin glue. Your incisions will be covered with a bandage (dressing). The procedure may vary among health care providers and hospitals. What happens after the procedure? Your blood pressure, heart rate, breathing rate, and blood oxygen level will be monitored until you leave the hospital or clinic. You will be given medicines as needed to control your pain. You may have a drain placed in the incision. The drain will be removed a day or two after the procedure. Summary Minimally invasive cholecystectomy, also called laparoscopic cholecystectomy, is surgery to remove the gallbladder using small incisions. Tell your health care provider about all the medical conditions you have and all the medicines you are taking for those conditions. Before the procedure, follow instructions about when to stop eating and drinking and  changing or stopping medicines. Plan to have a responsible adult care for you for the time you are told after you leave the hospital or clinic. This information is not intended to replace advice given to you by your health care provider. Make sure you discuss any questions you have with your health care provider. Document Revised: 03/18/2021 Document Reviewed: 03/18/2021 Elsevier Patient Education  [place].

## 2022-07-16 ENCOUNTER — Telehealth: Payer: Self-pay | Admitting: Licensed Clinical Social Worker

## 2022-07-16 ENCOUNTER — Ambulatory Visit: Payer: Self-pay | Admitting: Surgery

## 2022-07-16 ENCOUNTER — Encounter: Payer: Self-pay | Admitting: Licensed Clinical Social Worker

## 2022-07-16 DIAGNOSIS — K828 Other specified diseases of gallbladder: Secondary | ICD-10-CM

## 2022-07-16 DIAGNOSIS — Z1379 Encounter for other screening for genetic and chromosomal anomalies: Secondary | ICD-10-CM | POA: Insufficient documentation

## 2022-07-16 NOTE — Telephone Encounter (Signed)
I contacted Ms. Sidor to discuss her genetic testing results. No pathogenic variants were identified in the 77 genes analyzed. Detailed clinic note to follow.   The test report has been scanned into EPIC and is located under the Molecular Pathology section of the Results Review tab.  A portion of the result report is included below for reference.     Faith Rogue, MS, [facility] Genetic Counselor [email or web address] Phone: [phone]

## 2022-07-17 ENCOUNTER — Encounter: Payer: Self-pay | Admitting: Nurse Practitioner

## 2022-07-17 ENCOUNTER — Ambulatory Visit: Payer: Self-pay | Admitting: Licensed Clinical Social Worker

## 2022-07-17 DIAGNOSIS — Z1379 Encounter for other screening for genetic and chromosomal anomalies: Secondary | ICD-10-CM

## 2022-07-17 NOTE — Progress Notes (Signed)
HPI:   Ms. Yearwood was previously seen in the [facility] due to a personal and family history of cancer and concerns regarding a hereditary predisposition to cancer. Please refer to our prior cancer genetics clinic note for more information regarding our discussion, assessment and recommendations, at the time. Ms. Schreurs recent genetic test results were disclosed to her, as were recommendations warranted by these results. These results and recommendations are discussed in more detail below.  CANCER HISTORY:  Oncology History  Invasive carcinoma of breast ([place])  11/14/2021 Initial Diagnosis   Invasive carcinoma of breast [facility]) Patient felt a left breast mass. 10/30/2021, bilateral diagnostic mammogram showed 21 mm mass in the left breast, 5:00, 9 cm from nipple.  No suspicious left axillary lymphadenopathy.  No mammographic evidence of malignancy in the right breast.   11/06/2021, left breast ultrasound-guided biopsy showed invasive mammary carcinoma, no special type.  Grade 3, DCIS not identified, LVI not identified.  ER+/PR +HER2 - Menarche 61 years of age, patient has 3 children.  She was 14 years old when she gave birth to her first child.  Short-term OCP use from [phone].  Patient has a history of hysterectomy 1990.  She denies family history of breast cancer. Denies any hormone replacement therapy.  Denies any chest radiation.  She recalls left breast biopsy in 2012.  She did not know details of the pathology.   11/14/2021 Cancer Staging   Staging form: Breast, AJCC 8th Edition - Clinical stage from 11/14/2021: Stage IIA (cT2, cN0, cM0, G3, ER+, PR+, HER2-) - Signed by Earlie Server, MD on 04/28/2022 Stage prefix: Initial diagnosis Histologic grading system: 3 grade system   01/26/2022 Surgery   Left breast lumpectomy and sentinel lymph node biopsy. Invasive mammary carcinoma, DCIS, 3 lymph nodes negative for malignancy.  Grade 3, all margins negative for invasive  carcinoma.  Distance from closest margin 4 mm.  All margins negative for DCIS. pT2 pN0   01/26/2022 Oncotype testing   Oncotype DX breast recurrence score  14   04/01/2022 - 04/14/2022 Radiation Therapy   Adjuvant left breast radiation     FAMILY HISTORY:  We obtained a detailed, 4-generation family history.  Significant diagnoses are listed below: Family History  Problem Relation Age of Onset   Thyroid disease Mother    Asthma Mother    Osteoarthritis Father    CAD Father    Diabetes Maternal Uncle    Diabetes Maternal Grandfather    Tuberculosis Maternal Grandfather    Diabetes Paternal Grandmother    Heart attack Paternal Grandfather    Breast cancer Other        great grandmother    Ms. Wander has 3 daughters (41, 36, 3). They  have not had cancer. She does not have siblings.   Ms. Abbasi mother is living at 31 and has had benign breast cysts, no history of cancer. Patient had 1 maternal aunt, 1 maternal uncle, no cancer history. Maternal grandmother died at 10 with no history of cancer, but her mother did have breast cancer and passed at 76. Maternal grandfather died at 68.   Ms. Muise father is living at 22. Patient had 2 paternal aunts, 2 paternal uncles. One aunt died from cancer recently, unknown type. Paternal grandparents did not have cancer.   Ms. Fesler is unaware of previous family history of genetic testing for hereditary cancer risks. Patient's maternal ancestors are of Irish/European descent, and paternal ancestors are of European descent. There is no reported  Ashkenazi Jewish ancestry. There is no known consanguinity.       GENETIC TEST RESULTS:  The [facility] CancerNext-Expanded+RNA Panel found no pathogenic mutations.   The CancerNext-Expanded + RNAinsight gene panel offered by [employer] and includes sequencing and rearrangement analysis for the following 77 genes: IP, ALK, APC*, ATM*, AXIN2, BAP1, BARD1, BLM, BMPR1A, BRCA1*, BRCA2*, BRIP1*,  CDC73, CDH1*,CDK4, CDKN1B, CDKN2A, CHEK2*, CTNNA1, DICER1, FANCC, FH, FLCN, GALNT12, KIF1B, LZTR1, MAX, MEN1, MET, MLH1*, MSH2*, MSH3, MSH6*, MUTYH*, NBN, NF1*, NF2, NTHL1, PALB2*, PHOX2B, PMS2*, POT1, PRKAR1A, PTCH1, PTEN*, RAD51C*, RAD51D*,RB1, RECQL, RET, SDHA, SDHAF2, SDHB, SDHC, SDHD, SMAD4, SMARCA4, SMARCB1, SMARCE1, STK11, SUFU, TMEM127, TP53*,TSC1, TSC2, VHL and XRCC2 (sequencing and deletion/duplication); EGFR, EGLN1, HOXB13, KIT, MITF, PDGFRA, POLD1 and POLE (sequencing only); EPCAM and GREM1 (deletion/duplication only).   The test report has been scanned into EPIC and is located under the Molecular Pathology section of the Results Review tab.  A portion of the result report is included below for reference. Genetic testing reported out on 01/26/2022.      Even though a pathogenic variant was not identified, possible explanations for the cancer in the family may include: There may be no hereditary risk for cancer in the family. The cancers in Ms. Blyden and/or her family may be sporadic/familial or due to other genetic and environmental factors. There may be a gene mutation in one of these genes that current testing methods cannot detect but that chance is small. There could be another gene that has not yet been discovered, or that we have not yet tested, that is responsible for the cancer diagnoses in the family.  It is also possible there is a hereditary cause for the cancer in the family that Ms. Reddoch did not inherit.  Therefore, it is important to remain in touch with cancer genetics in the future so that we can continue to offer Ms. Ramos the most up to date genetic testing.   ADDITIONAL GENETIC TESTING:  We discussed with Ms. Javid that her genetic testing was fairly extensive.  If there are additional relevant genes identified to increase cancer risk that can be analyzed in the future, we would be happy to discuss and coordinate this testing at that time.   CANCER SCREENING  RECOMMENDATIONS:  Ms. Gubbels test result is considered negative (normal).  This means that we have not identified a hereditary cause for her personal and family history of cancer at this time.   An individual's cancer risk and medical management are not determined by genetic test results alone. Overall cancer risk assessment incorporates additional factors, including personal medical history, family history, and any available genetic information that may result in a personalized plan for cancer prevention and surveillance. Therefore, it is recommended she continue to follow the cancer management and screening guidelines provided by her oncology and primary healthcare provider.  RECOMMENDATIONS FOR FAMILY MEMBERS:   Since she did not inherit a identifiable mutation in a cancer predisposition gene included on this panel, her children could not have inherited a known mutation from her in one of these genes. Individuals in this family might be at some increased risk of developing cancer, over the general population risk, due to the family history of cancer.  Individuals in the family should notify their providers of the family history of cancer. We recommend women in this family have a yearly mammogram beginning at age 84, or 42 years younger than the earliest onset of cancer, an annual clinical breast exam, and perform monthly breast  self-exams.  Family members should have colonoscopies by at age 19, or earlier, as recommended by their providers.  FOLLOW-UP:  Lastly, we discussed with Ms. Slee that cancer genetics is a rapidly advancing field and it is possible that new genetic tests will be appropriate for her and/or her family members in the future. We encouraged her to remain in contact with cancer genetics on an annual basis so we can update her personal and family histories and let her know of advances in cancer genetics that may benefit this family.   Our contact number was provided. Ms.  Cullinan questions were answered to her satisfaction, and she knows she is welcome to call us at anytime with additional questions or concerns.    Faith Rogue, MS, [facility] Genetic Counselor [email or web address] Phone: [phone]

## 2022-07-23 ENCOUNTER — Other Ambulatory Visit: Payer: Self-pay | Admitting: Oncology

## 2022-07-24 ENCOUNTER — Telehealth: Payer: Self-pay | Admitting: Surgery

## 2022-07-24 NOTE — Telephone Encounter (Signed)
Patient has been advised of Pre-Admission date/time, and Surgery date at [facility].  Surgery Date: 08/26/22 Preadmission Testing Date: 08/18/22 (phone 8a-1p)  Patient has been made aware to call [phone], between 1-3:00pm the day before surgery, to find out what time to arrive for surgery.

## 2022-07-29 ENCOUNTER — Other Ambulatory Visit: Payer: Federal, State, Local not specified - PPO

## 2022-07-29 ENCOUNTER — Ambulatory Visit: Payer: Federal, State, Local not specified - PPO | Admitting: Oncology

## 2022-08-04 ENCOUNTER — Ambulatory Visit: Payer: Federal, State, Local not specified - PPO | Admitting: Nurse Practitioner

## 2022-08-09 NOTE — Patient Instructions (Signed)
Food Basics for Chronic Kidney Disease Chronic kidney disease (CKD) is when your kidneys are not working well. They cannot remove waste, fluids, and other substances from your blood the way they should. These substances can build up, which can worsen kidney damage and affect how your body works. Eating certain foods can lead to a buildup of these substances. Changing your diet can help prevent more kidney damage. Diet changes may also delay dialysis or even keep you from needing it. What nutrients should I limit? Work with your treatment team and a food expert (dietitian) to make a meal plan that's right for you. Foods you can eat and foods you should limit or avoid will depend on the stage of your kidney disease and any other health conditions you have. The items listed below are not a complete list. Talk with your dietitian to learn what is best for you. Potassium Potassium affects how steadily your heart beats. Too much potassium in your blood can cause an irregular heartbeat or even a heart attack. You may need to limit foods that are high in potassium, such as: Liquid milk and soy milk. Salt substitutes that contain potassium. Fruits like bananas, apricots, nectarines, melon, prunes, raisins, kiwi, and oranges. Vegetables, such as potatoes, sweet potatoes, yams, tomatoes, leafy greens, beets, avocado, pumpkin, and winter squash. Beans, like lima beans. Nuts. Phosphorus Phosphorus is a mineral found in your bones. You need a balance between calcium and phosphorus to build and maintain healthy bones. Too much added phosphorus from the foods you eat can pull calcium from your bones. Losing calcium can make your bones weak and more likely to break. Too much phosphorus can also make your skin itch. You may need to limit foods that are high in phosphorus or that have added phosphorus, such as: Liquid milk and dairy products. Dark-colored sodas or soft drinks. Bran cereals and  oatmeal. Protein  Protein helps you make and keep muscle. Protein also helps to repair your body's cells and tissues. One of the natural breakdown products of protein is a waste product called urea. When your kidneys are not working well, they cannot remove types of waste like urea. Reducing protein in your diet can help keep urea from building up in your blood. Depending on your stage of kidney disease, you may need to eat smaller portions of foods that are high in protein. Sources of animal protein include: Meat (all types). Fish and seafood. Poultry. Eggs. Dairy. Other protein foods include: Beans and legumes. Nuts and nut butter. Soy, like tofu.  Sodium Salt (sodium) helps to keep a healthy balance of fluids in your body. Too much salt can increase your blood pressure, which can harm your heart and lungs. Extra salt can also cause your body to keep too much fluid, making your kidneys work harder. You may need to limit or avoid foods that are high in salt, such as: Salt seasonings. Soy and teriyaki sauce. Packaged, precooked, cured, or processed meats, such as sausages or meat loaves. Sardines. Salted crackers and snack foods. Fast food. Canned soups and most canned foods. Pickled foods. Vegetable juice. Boxed mixes or ready-to-eat boxed meals and side dishes. Bottled dressings, sauces, and marinades. Talk with your dietitian about how much potassium, phosphorus, protein, and salt you may have each day. Helpful tips Read food labels  Check the amount of salt in foods. Limit foods that have salt or sodium listed among the first five ingredients. Try to eat low-salt foods. Check the ingredient list   for added phosphorus or potassium. "Phos" in an ingredient is a sign that phosphorus has been added. Do not buy foods that are calcium-enriched or that have calcium added to them (are fortified). Buy canned vegetables and beans that say "no salt added" and rinse them before  eating. Lifestyle Limit the amount of protein you eat from animal sources each day. Focus on protein from plant sources, like tofu and dried beans, peas, and lentils. Do not add salt to food when cooking or before eating. Do not eat star fruit. It can be toxic for people with kidney problems. Talk with your health care provider before taking any vitamin or mineral supplements. If told by your health care provider, track how much liquid you drink so you can avoid drinking too much. You may need to include foods you eat that are made mostly from water, like gelatin, ice cream, soups, and juicy fruits and vegetables. If you have diabetes: If you have diabetes (diabetes mellitus) and CKD, you need to keep your blood sugar (glucose) in the target range recommended by your health care provider. Follow your diabetes management plan. This may include: Checking your blood glucose regularly. Taking medicines by mouth, or taking insulin, or both. Exercising for at least 30 minutes on 5 or more days each week, or as told by your health care provider. Tracking how many servings of carbohydrates you eat at each meal. Not using orange juice to treat low blood sugars. Instead, use apple juice, cranberry juice, or clear soda. You may be given guidelines on what foods and nutrients you may eat, and how much you can have each day. This depends on your stage of kidney disease and whether you have high blood pressure (hypertension). Follow the meal plan your dietitian gives you. To learn more: National Institute of Diabetes and Digestive and Kidney Diseases: niddk.nih.gov National Kidney Foundation: kidney.org Summary Chronic kidney disease (CKD) is when your kidneys are not working well. They cannot remove waste, fluids, and other substances from your blood the way they should. These substances can build up, which can worsen kidney damage and affect how your body works. Changing your diet can help prevent more  kidney damage. Diet changes may also delay dialysis or even keep you from needing it. Diet changes are different for each person with CKD. Work with a dietitian to set up a meal plan that is right for you. This information is not intended to replace advice given to you by your health care provider. Make sure you discuss any questions you have with your health care provider. Document Revised: 01/02/2022 Document Reviewed: 01/08/2020 Elsevier Patient Education  2023 Elsevier Inc.  

## 2022-08-11 ENCOUNTER — Encounter: Payer: Self-pay | Admitting: Nurse Practitioner

## 2022-08-14 ENCOUNTER — Encounter: Payer: Self-pay | Admitting: Nurse Practitioner

## 2022-08-14 ENCOUNTER — Ambulatory Visit: Payer: Federal, State, Local not specified - PPO | Admitting: Nurse Practitioner

## 2022-08-14 VITALS — BP 130/78 | HR 97 | Temp 98.3°F | Ht 60.98 in | Wt 261.4 lb

## 2022-08-14 DIAGNOSIS — Z6841 Body Mass Index (BMI) 40.0 and over, adult: Secondary | ICD-10-CM

## 2022-08-14 DIAGNOSIS — N1831 Chronic kidney disease, stage 3a: Secondary | ICD-10-CM

## 2022-08-14 DIAGNOSIS — I10 Essential (primary) hypertension: Secondary | ICD-10-CM | POA: Diagnosis not present

## 2022-08-14 NOTE — Assessment & Plan Note (Signed)
Ongoing with urine ALB 80, continue ARB for kidney protection.  Recheck BMP today.  Avoid ACE due to COPD.  Nephrology scheduled for January.

## 2022-08-14 NOTE — Assessment & Plan Note (Signed)
Chronic and improved with BP at goal today.  Educated on [place] and recommend checking BP at home daily and documenting for provider visits. Continue Losartan daily, which is beneficial for proteinuria (80) as well and continue 2.5 MG Amlodipine daily, educated her on these.  Avoid ACE due to underlying lung disease. Scheduled to see nephrology.

## 2022-08-14 NOTE — Telephone Encounter (Signed)
Spoke with patient and informed her that she has $0.00 balance. Also advised patient that copay is due regardless of deductible met.

## 2022-08-14 NOTE — Progress Notes (Signed)
BP 130/78 (BP Location: Left Arm, Patient Position: Sitting, Cuff Size: Large)   Pulse 97   Temp 98.3 F (36.8 C) (Oral)   Ht 5' 0.98" (1.549 m)   Wt 261 lb 6.4 oz (118.6 kg)   LMP  (LMP Unknown)   SpO2 96%   BMI 49.42 kg/m    Subjective:    Patient ID: Dorothy Cunningham, female    DOB: 1961/08/08, 61 y.o.   MRN: [id number]  HPI: Dorothy Cunningham is a 61 y.o. female  Chief Complaint  Patient presents with   Hypertension   Chronic Kidney Disease   HYPERTENSION Currently taking Losartan 100 MG and added on low dose Amlodipine on 07/07/22, 2.5 MG.  Proteinuria on past labs (80).  She is tolerating Amlodipine well.   Hypertension status: stable  Satisfied with current treatment? yes Duration of hypertension: chronic BP monitoring frequency:  weekly BP range:  BP medication side effects:  no Medication compliance: good compliance Aspirin: no Recurrent headaches: no Visual changes: no Palpitations: no Dyspnea: no Chest pain: no Lower extremity edema: this has gone done  Dizzy/lightheaded: no   CHRONIC KIDNEY DISEASE Ongoing -- chronic severe asymmetric left renal atrophy noted on imaging -- with underlying CKD 3a on labs.  Scheduled to see nephrology. CKD status: stable Medications renally dose: yes Previous renal evaluation: no Pneumovax:  Up to Date Influenza Vaccine:  Up to Date  Relevant past medical, surgical, family and social history reviewed and updated as indicated. Interim medical history since our last visit reviewed. Allergies and medications reviewed and updated.  Review of Systems  Constitutional:  Negative for activity change, appetite change, diaphoresis, fatigue and fever.  Respiratory:  Negative for cough, chest tightness, shortness of breath and wheezing.   Cardiovascular:  Negative for chest pain, palpitations and leg swelling.  Gastrointestinal: Negative.   Neurological: Negative.   Psychiatric/Behavioral: Negative.      Per HPI  unless specifically indicated above     Objective:    BP 130/78 (BP Location: Left Arm, Patient Position: Sitting, Cuff Size: Large)   Pulse 97   Temp 98.3 F (36.8 C) (Oral)   Ht 5' 0.98" (1.549 m)   Wt 261 lb 6.4 oz (118.6 kg)   LMP  (LMP Unknown)   SpO2 96%   BMI 49.42 kg/m   Wt Readings from Last 3 Encounters:  08/14/22 261 lb 6.4 oz (118.6 kg)  07/14/22 257 lb (116.6 kg)  07/07/22 261 lb 4.8 oz (118.5 kg)    Physical Exam Vitals and nursing note reviewed.  Constitutional:      General: She is awake. She is not in acute distress.    Appearance: She is well-developed and well-groomed. She is morbidly obese. She is not ill-appearing.  HENT:     Head: Normocephalic.     Right Ear: Hearing normal.     Left Ear: Hearing normal.  Eyes:     General: Lids are normal.        Right eye: No discharge.        Left eye: No discharge.     Conjunctiva/sclera: Conjunctivae normal.     Pupils: Pupils are equal, round, and reactive to light.  Neck:     Thyroid: No thyromegaly.     Vascular: No carotid bruit.  Cardiovascular:     Rate and Rhythm: Normal rate and regular rhythm.     Heart sounds: Normal heart sounds. No murmur heard.    No gallop.  Pulmonary:  Effort: Pulmonary effort is normal. No accessory muscle usage or respiratory distress.     Breath sounds: Normal breath sounds.  Abdominal:     General: Bowel sounds are normal.     Palpations: Abdomen is soft.  Musculoskeletal:     Cervical back: Normal range of motion and neck supple.     Right lower leg: No edema.     Left lower leg: No edema.  Skin:    General: Skin is warm and dry.  Neurological:     Mental Status: She is alert and oriented to person, place, and time.  Psychiatric:        Attention and Perception: Attention normal.        Mood and Affect: Mood normal.        Speech: Speech normal.        Behavior: Behavior normal. Behavior is cooperative.        Thought Content: Thought content normal.     Results for orders placed or performed in visit on 20/10/07  Basic metabolic panel  Result Value Ref Range   Glucose 86 70 - 99 mg/dL   BUN 19 8 - 27 mg/dL   Creatinine, Ser 1.19 (H) 0.57 - 1.00 mg/dL   eGFR 51 (L) >59 mL/min/1.73   BUN/Creatinine Ratio 16 12 - 28   Sodium 140 134 - 144 mmol/L   Potassium 4.0 3.5 - 5.2 mmol/L   Chloride 103 96 - 106 mmol/L   CO2 20 20 - 29 mmol/L   Calcium 10.0 8.7 - 10.3 mg/dL      Assessment & Plan:   Problem List Items Addressed This Visit       Cardiovascular and Mediastinum   Essential hypertension    Chronic and improved with BP at goal today.  Educated on [place] and recommend checking BP at home daily and documenting for provider visits. Continue Losartan daily, which is beneficial for proteinuria (80) as well and continue 2.5 MG Amlodipine daily, educated her on these.  Avoid ACE due to underlying lung disease. Scheduled to see nephrology.      Relevant Orders   Basic metabolic panel     Genitourinary   Stage 3a chronic kidney disease ([place]) - Primary (Chronic)    Ongoing with urine ALB 80, continue ARB for kidney protection.  Recheck BMP today.  Avoid ACE due to COPD.  Nephrology scheduled for January.        Other   BMI 50.0-59.9, adult [facility])    Refer to Morbid Obesity plan of care.      Morbid obesity (HCC)    BMI 49.42.  Recommended eating smaller high protein, low fat meals more frequently and exercising 30 mins a day 5 times a week with a goal of 10-15lb weight loss in the next 3 months. Patient voiced their understanding and motivation to adhere to these recommendations.         Follow up plan: Return in about 2 months (around 10/29/2022) for Annual physical.

## 2022-08-14 NOTE — Assessment & Plan Note (Signed)
Refer to Morbid Obesity plan of care.

## 2022-08-14 NOTE — Assessment & Plan Note (Signed)
BMI 49.42.  Recommended eating smaller high protein, low fat meals more frequently and exercising 30 mins a day 5 times a week with a goal of 10-15lb weight loss in the next 3 months. Patient voiced their understanding and motivation to adhere to these recommendations.

## 2022-08-15 LAB — BASIC METABOLIC PANEL
BUN/Creatinine Ratio: 11 — ABNORMAL LOW (ref 12–28)
BUN: 13 mg/dL (ref 8–27)
CO2: 24 mmol/L (ref 20–29)
Calcium: 9.7 mg/dL (ref 8.7–10.3)
Chloride: 104 mmol/L (ref 96–106)
Creatinine, Ser: 1.21 mg/dL — ABNORMAL HIGH (ref 0.57–1.00)
Glucose: 91 mg/dL (ref 70–99)
Potassium: 4 mmol/L (ref 3.5–5.2)
Sodium: 143 mmol/L (ref 134–144)
eGFR: 50 mL/min/{1.73_m2} — ABNORMAL LOW (ref >59)

## 2022-08-15 NOTE — Progress Notes (Signed)
Contacted via [place] morning Manus Gunning, your labs have returned.  Overall electrolytes remain stable. Kidney function continues to show baseline Stage 3a kidney disease with no worsening.  Good news.  Any questions? Keep being awesome!!  Thank you for allowing me to participate in your care.  I appreciate you. Kindest regards, Rickia [place]

## 2022-08-18 ENCOUNTER — Encounter
Admission: RE | Admit: 2022-08-18 | Discharge: 2022-08-18 | Disposition: A | Discharge status: Home / Self Care | Payer: Federal, State, Local not specified - PPO | Source: Ambulatory Visit | Attending: Surgery | Admitting: Surgery

## 2022-08-18 VITALS — Ht 60.98 in | Wt 261.4 lb

## 2022-08-18 DIAGNOSIS — Z01812 Encounter for preprocedural laboratory examination: Secondary | ICD-10-CM

## 2022-08-18 DIAGNOSIS — N1831 Chronic kidney disease, stage 3a: Secondary | ICD-10-CM

## 2022-08-18 DIAGNOSIS — I10 Essential (primary) hypertension: Secondary | ICD-10-CM

## 2022-08-18 HISTORY — DX: Body mass index (BMI) 45.0-49.9, adult: E66.01

## 2022-08-18 HISTORY — DX: Chronic kidney disease, stage 3a: N18.31

## 2022-08-18 NOTE — Patient Instructions (Addendum)
Your procedure is scheduled on: Wednesday, November 29 Report to the Registration Desk on the 1st floor of the [facility]. To find out your arrival time, please call [phone] between 1PM - 3PM on: Tuesday, November 28 If your arrival time is 6:00 am, do not arrive prior to that time as the [place] [facility] entrance doors do not open until 6:00 am.  REMEMBER: Instructions that are not followed completely may result in serious medical risk, up to and including death; or upon the discretion of your surgeon and anesthesiologist your surgery may need to be rescheduled.  Do not eat or drink after midnight the night before surgery.  No gum chewing, lozengers or hard candies.  TAKE THESE MEDICATIONS THE MORNING OF SURGERY WITH A SIP OF WATER:  Amlodipine Anastrozole (Arimidex) Atorvastatin (Lipitor) Bupropion (Wellbutrin) Anoro ellipta inhaler  Use inhalers on the day of surgery and bring your albuterol inhaler to the hospital.  One week prior to surgery: starting November 22 Stop aspirin and Anti-inflammatories (NSAIDS) such as Advil, Aleve, Ibuprofen, Motrin, Naproxen, Naprosyn and Aspirin based products such as Excedrin, Goodys Powder, BC Powder. Stop ANY OVER THE COUNTER supplements until after surgery. Stop calcium, vitamin D, garlic, multiple vitamin, fish oil You may however, continue to take Tylenol if needed for pain up until the day of surgery.  No Alcohol for 24 hours before or after surgery.  No Smoking including e-cigarettes for 24 hours prior to surgery.  No chewable tobacco products for at least 6 hours prior to surgery.  No nicotine patches on the day of surgery.  Do not use any "recreational" drugs for at least a week prior to your surgery.  Please be advised that the combination of cocaine and anesthesia may have negative outcomes, up to and including death. If you test positive for cocaine, your surgery will be cancelled.  On the morning of surgery brush your teeth  with toothpaste and water, you may rinse your mouth with mouthwash if you wish. Do not swallow any toothpaste or mouthwash.  Use CHG Soap as directed on instruction sheet.  Do not wear jewelry, make-up, hairpins, clips or nail polish.  Do not wear lotions, powders, or perfumes.   Do not shave body from the neck down 48 hours prior to surgery just in case you cut yourself which could leave a site for infection.  Also, freshly shaved skin may become irritated if using the CHG soap.  Contact lenses, hearing aids and dentures may not be worn into surgery.  Do not bring valuables to the hospital. [facility] is not responsible for any missing/lost belongings or valuables.   Notify your doctor if there is any change in your medical condition (cold, fever, infection).  Wear comfortable clothing (specific to your surgery type) to the hospital.  After surgery, you can help prevent lung complications by doing breathing exercises.  Take deep breaths and cough every 1-2 hours. Your doctor may order a device called an Incentive Spirometer to help you take deep breaths. When coughing or sneezing, hold a pillow firmly against your incision with both hands. This is called "splinting." Doing this helps protect your incision. It also decreases belly discomfort.  If you are being discharged the day of surgery, you will not be allowed to drive home. You will need a responsible adult (18 years or older) to drive you home and stay with you that night.   If you are taking public transportation, you will need to have a responsible adult (  18 years or older) with you. Please confirm with your physician that it is acceptable to use public transportation.   Please call the [place] Dept. at [phone] if you have any questions about these instructions.  Surgery Visitation Policy:  Patients undergoing a surgery or procedure may have two family members or support persons with them as long as  the person is not COVID-19 positive or experiencing its symptoms.      Preparing for Surgery with CHLORHEXIDINE GLUCONATE (CHG) Soap  Chlorhexidine Gluconate (CHG) Soap  o An antiseptic cleaner that kills germs and bonds with the skin to continue killing germs even after washing  o Used for showering the night before surgery and morning of surgery  Before surgery, you can play an important role by reducing the number of germs on your skin.  CHG (Chlorhexidine gluconate) soap is an antiseptic cleanser which kills germs and bonds with the skin to continue killing germs even after washing.  Please do not use if you have an allergy to CHG or antibacterial soaps. If your skin becomes reddened/irritated stop using the CHG.  1. Shower the NIGHT BEFORE SURGERY and the MORNING OF SURGERY with CHG soap.  2. If you choose to wash your hair, wash your hair first as usual with your normal shampoo.  3. After shampooing, rinse your hair and body thoroughly to remove the shampoo.  4. Use CHG as you would any other liquid soap. You can apply CHG directly to the skin and wash gently with a scrungie or a clean washcloth.  5. Apply the CHG soap to your body only from the neck down. Do not use on open wounds or open sores. Avoid contact with your eyes, ears, mouth, and genitals (private parts). Wash face and genitals (private parts) with your normal soap.  6. Wash thoroughly, paying special attention to the area where your surgery will be performed.  7. Thoroughly rinse your body with warm water.  8. Do not shower/wash with your normal soap after using and rinsing off the CHG soap.  9. Pat yourself dry with a clean towel.  10. Wear clean pajamas to bed the night before surgery.  12. Place clean sheets on your bed the night of your first shower and do not sleep with pets.  13. Shower again with the CHG soap on the day of surgery prior to arriving at the hospital.  14. Do not apply any  deodorants/lotions/powders.  15. Please wear clean clothes to the hospital.

## 2022-08-18 NOTE — Progress Notes (Signed)
Perioperative Services Pre-Admission/Anesthesia Testing   Date: 08/18/22 Name: Dorothy Cunningham MRN:   [id number]  Re: Consideration of preoperative prophylactic antibiotic change   Request sent to: Ronny Bacon, MD (routed and/or faxed via [facility])  Planned Surgical Procedure(s):    Case: [id number] Date/Time: 08/26/22 0715   Procedures:      XI ROBOTIC ASSISTED LAPAROSCOPIC CHOLECYSTECTOMY     INDOCYANINE GREEN FLUORESCENCE IMAGING (ICG)   Anesthesia type: General   Pre-op diagnosis: porcelain gallbladder   Location: [facility]   Surgeons: Ronny Bacon, MD   Clinical Notes:  Patient has a documented allergy/intolerance to PCN  Advising that PCN has caused her to experience urticarial rash in the past (as a child).   Screened as appropriate for cephalosporin use during medication reconciliation No immediate angioedema, dysphagia, SOB, anaphylaxis symptoms. No severe rash involving mucous membranes or skin necrosis. No hospital admissions related to side effects of PCN/cephalosporin use.  No documented reaction to PCN or cephalosporin in the last 10 years.  Request:  As an evidence based approach to reducing the rate of incidence for post-operative SSI and the development of MDROs, could an agent that allows for narrower antimicrobial coverage for preoperative prophylaxis in this patient's upcoming surgical course be considered?   Currently ordered preoperative prophylactic ABX: ciprofloxacin.   Specifically requesting change to cephalosporin (CEFAZOLIN).  Drug of choice for many procedures; it is the most widely studied antimicrobial agent with proven efficacy for antimicrobial prophylaxis.   Desirable duration of action, spectrum of activity against organisms commonly encountered in surgery, and it has an excellent safety profile and low cost.   Active against streptococci, methicillin-susceptible staphylococci, and many  gram-negative organisms.  Please communicate decision with me and I will change the orders in Epic as per your direction.   Things to consider: Many patients report that they were "allergic" to PCN earlier in life, however this does not translate into a true lifelong allergy. Patients can lose sensitivity to specific IgE antibodies over time if PCN is avoided (Kleris & Lugar, 2019).  Up to 10% of the adult population and 15% of hospitalized patients report an allergy to PCN, however clinical studies suggest that 90% of those reporting an allergy can tolerate PCN antibiotics (Kleris & Lugar, 2019).  Cross-sensitivity between PCN and cephalosporins has been documented as being as high as 10%, however this estimation included data believed to have been collected in a setting where there was contamination. Newer data suggests that the prevalence of cross-sensitivity between PCN and cephalosporins is actually estimated to be closer to 1% (Hermanides et al., 2018).   Patients labeled as PCN allergic, whether they are truly allergic or not, have been found to have inferior outcomes in terms of rates of serious infection, and these patients tend to have longer hospital stays ([place], 2019).  Treatment related secondary infections, such as Clostridioides difficile, have been linked to the improper use of broad spectrum antibiotics in patients improperly labeled as PCN allergic (Kleris & Lugar, 2019).  Anaphylaxis from cephalosporins is rare and the evidence suggests that there is no increased risk of an anaphylactic type reaction when cephalosporins are used in a PCN allergic patient (Pichichero, 2006).  Citations: Hermanides J, Lemkes BA, Prins Pearla Dubonnet MW, Terreehorst I. Presumed ?-Lactam Allergy and Cross-reactivity in the Operating Theater: A Practical Approach. Anesthesiology. 2018 Aug;129(2):335-342. doi: 10.1097/ALN.0000000000002252. PMID: 53299242.  Kleris, [place]., & Lugar, P. L. (2019).  Things We Do  For No Reason: Failing to Question a Penicillin Allergy History. Journal of hospital medicine, 14(10), 6170056088. Advance online publication. https://www.wallace-[place].info/  Pichichero, M. E. (2006). Cephalosporins can be prescribed safely for penicillin-allergic patients. Journal of family medicine, 55(2), 106-112. Accessed: https://cdn.mdedge.com/files/s38f-public/Document/September-2017/5502JFP_AppliedEvidence1.pdf   BHonor Loh MSN, APRN, FNP-C, CEN [facility] Peri-operative Services Nurse Practitioner FAX: ([phone]/21/23 4:24 PM

## 2022-08-19 ENCOUNTER — Encounter: Payer: Self-pay | Admitting: Oncology

## 2022-08-19 ENCOUNTER — Encounter: Payer: Self-pay | Admitting: Urgent Care

## 2022-08-19 ENCOUNTER — Inpatient Hospital Stay (HOSPITAL_BASED_OUTPATIENT_CLINIC_OR_DEPARTMENT_OTHER): Payer: Federal, State, Local not specified - PPO | Admitting: Oncology

## 2022-08-19 ENCOUNTER — Inpatient Hospital Stay: Payer: Federal, State, Local not specified - PPO | Attending: Oncology

## 2022-08-19 ENCOUNTER — Encounter
Admission: RE | Admit: 2022-08-19 | Discharge: 2022-08-19 | Disposition: A | Discharge status: Home / Self Care | Payer: Federal, State, Local not specified - PPO | Source: Ambulatory Visit | Attending: Surgery | Admitting: Surgery

## 2022-08-19 VITALS — BP 204/93 | HR 97 | Temp 99.0°F | Wt 257.6 lb

## 2022-08-19 DIAGNOSIS — Z170 Estrogen receptor positive status [ER+]: Secondary | ICD-10-CM | POA: Insufficient documentation

## 2022-08-19 DIAGNOSIS — Z01812 Encounter for preprocedural laboratory examination: Secondary | ICD-10-CM

## 2022-08-19 DIAGNOSIS — Z87891 Personal history of nicotine dependence: Secondary | ICD-10-CM | POA: Insufficient documentation

## 2022-08-19 DIAGNOSIS — Z880 Allergy status to penicillin: Secondary | ICD-10-CM | POA: Insufficient documentation

## 2022-08-19 DIAGNOSIS — Z8349 Family history of other endocrine, nutritional and metabolic diseases: Secondary | ICD-10-CM | POA: Insufficient documentation

## 2022-08-19 DIAGNOSIS — Z79899 Other long term (current) drug therapy: Secondary | ICD-10-CM | POA: Diagnosis not present

## 2022-08-19 DIAGNOSIS — E785 Hyperlipidemia, unspecified: Secondary | ICD-10-CM | POA: Insufficient documentation

## 2022-08-19 DIAGNOSIS — I129 Hypertensive chronic kidney disease with stage 1 through stage 4 chronic kidney disease, or unspecified chronic kidney disease: Secondary | ICD-10-CM | POA: Diagnosis not present

## 2022-08-19 DIAGNOSIS — Z825 Family history of asthma and other chronic lower respiratory diseases: Secondary | ICD-10-CM | POA: Insufficient documentation

## 2022-08-19 DIAGNOSIS — C50919 Malignant neoplasm of unspecified site of unspecified female breast: Secondary | ICD-10-CM

## 2022-08-19 DIAGNOSIS — N1831 Chronic kidney disease, stage 3a: Secondary | ICD-10-CM | POA: Insufficient documentation

## 2022-08-19 DIAGNOSIS — Z79811 Long term (current) use of aromatase inhibitors: Secondary | ICD-10-CM | POA: Insufficient documentation

## 2022-08-19 DIAGNOSIS — Z803 Family history of malignant neoplasm of breast: Secondary | ICD-10-CM | POA: Insufficient documentation

## 2022-08-19 DIAGNOSIS — Z01810 Encounter for preprocedural cardiovascular examination: Secondary | ICD-10-CM | POA: Diagnosis not present

## 2022-08-19 DIAGNOSIS — C50512 Malignant neoplasm of lower-outer quadrant of left female breast: Secondary | ICD-10-CM | POA: Insufficient documentation

## 2022-08-19 DIAGNOSIS — Z8261 Family history of arthritis: Secondary | ICD-10-CM | POA: Diagnosis not present

## 2022-08-19 DIAGNOSIS — Z6841 Body Mass Index (BMI) 40.0 and over, adult: Secondary | ICD-10-CM | POA: Diagnosis not present

## 2022-08-19 DIAGNOSIS — Z833 Family history of diabetes mellitus: Secondary | ICD-10-CM | POA: Insufficient documentation

## 2022-08-19 DIAGNOSIS — I10 Essential (primary) hypertension: Secondary | ICD-10-CM

## 2022-08-19 DIAGNOSIS — Z8249 Family history of ischemic heart disease and other diseases of the circulatory system: Secondary | ICD-10-CM | POA: Insufficient documentation

## 2022-08-19 LAB — COMPREHENSIVE METABOLIC PANEL
ALT: 48 U/L — ABNORMAL HIGH (ref 0–44)
AST: 39 U/L (ref 15–41)
Albumin: 4 g/dL (ref 3.5–5.0)
Alkaline Phosphatase: 63 U/L (ref 38–126)
Anion gap: 9 (ref 5–15)
BUN: 19 mg/dL (ref 8–23)
CO2: 24 mmol/L (ref 22–32)
Calcium: 9.1 mg/dL (ref 8.9–10.3)
Chloride: 105 mmol/L (ref 98–111)
Creatinine, Ser: 1.28 mg/dL — ABNORMAL HIGH (ref 0.44–1.00)
GFR, Estimated: 47 mL/min — ABNORMAL LOW (ref >60)
Glucose, Bld: 102 mg/dL — ABNORMAL HIGH (ref 70–99)
Potassium: 3.9 mmol/L (ref 3.5–5.1)
Sodium: 138 mmol/L (ref 135–145)
Total Bilirubin: 0.5 mg/dL (ref 0.3–1.2)
Total Protein: 7.7 g/dL (ref 6.5–8.1)

## 2022-08-19 LAB — CBC WITH DIFFERENTIAL/PLATELET
Abs Immature Granulocytes: 0.02 10*3/uL (ref 0.00–0.07)
Basophils Absolute: 0.1 10*3/uL (ref 0.0–0.1)
Basophils Relative: 1 %
Eosinophils Absolute: 0.2 10*3/uL (ref 0.0–0.5)
Eosinophils Relative: 2 %
HCT: 42.9 % (ref 36.0–46.0)
Hemoglobin: 15 g/dL (ref 12.0–15.0)
Immature Granulocytes: 0 %
Lymphocytes Relative: 28 %
Lymphs Abs: 2.4 10*3/uL (ref 0.7–4.0)
MCH: 32.6 pg (ref 26.0–34.0)
MCHC: 35 g/dL (ref 30.0–36.0)
MCV: 93.3 fL (ref 80.0–100.0)
Monocytes Absolute: 0.5 10*3/uL (ref 0.1–1.0)
Monocytes Relative: 6 %
Neutro Abs: 5.5 10*3/uL (ref 1.7–7.7)
Neutrophils Relative %: 63 %
Platelets: 301 10*3/uL (ref 150–400)
RBC: 4.6 MIL/uL (ref 3.87–5.11)
RDW: 12.2 % (ref 11.5–15.5)
WBC: 8.6 10*3/uL (ref 4.0–10.5)
nRBC: 0 % (ref 0.0–0.2)

## 2022-08-19 MED ORDER — ANASTROZOLE 1 MG PO TABS: ORAL_TABLET | ORAL | 6 refills | Status: DC

## 2022-08-19 NOTE — Assessment & Plan Note (Signed)
Avoid nephrotoxins.  Encourage oral hydration  

## 2022-08-19 NOTE — Progress Notes (Signed)
Hematology/Oncology progress note [phone] [phone]         Patient Care Team: Venita Lick, NP as PCP - General (Nurse Practitioner) Theodore Demark, RN (Inactive) as Oncology Nurse Navigator  ASSESSMENT & PLAN:   Cancer Staging  Invasive carcinoma of breast [facility]) Staging form: Breast, AJCC 8th Edition - Clinical stage from 11/14/2021: Stage IIA (cT2, cN0, cM0, G3, ER+, PR+, HER2-) - Signed by Earlie Server, MD on 04/28/2022    Invasive carcinoma of breast ([place]) Stage IIA left breast cancer, pT2 pN0 ER+, PR+, HER- Oncotype Dx 14, no adjuvant chemotherapy benefit. S/p adjuvant Radiation.  Patient tolerates Arimidex  Labs are reviewed and discussed with patient. Continue Arimidex 1 mg daily.  Refills were sent to pharmacy. Obtain annual diagnostic mammogram in February 2023  Stage 3a chronic kidney disease (HCC) Avoid nephrotoxins.  Encourage oral hydration  Aromatase inhibitor use Recommend calcium and vitamin D supplementation DEXA showed normal bone density.   Orders Placed This Encounter  Procedures   MM DIAG BREAST TOMO BILATERAL    Standing Status:   Future    Standing Expiration Date:   08/20/2023    Order Specific Question:   Reason for Exam (SYMPTOM  OR DIAGNOSIS REQUIRED)    Answer:   invasive carcinoma of breast    Order Specific Question:   Preferred imaging location?    Answer:   [facility]   US BREAST LTD UNI LEFT INC AXILLA    Standing Status:   Future    Standing Expiration Date:   08/20/2023    Order Specific Question:   Reason for Exam (SYMPTOM  OR DIAGNOSIS REQUIRED)    Answer:   invasive carcinoma of breast    Order Specific Question:   Preferred imaging location?    Answer:   [facility]   US BREAST LTD UNI RIGHT INC AXILLA    Standing Status:   Future    Standing Expiration Date:   08/20/2023    Order Specific Question:   Reason for Exam (SYMPTOM  OR DIAGNOSIS REQUIRED)    Answer:   invasive carcinoma of  breast    Order Specific Question:   Preferred imaging location?    Answer:   [facility]   CBC with Differential/Platelet    Standing Status:   Future    Standing Expiration Date:   08/20/2023   Comprehensive metabolic panel    Standing Status:   Future    Standing Expiration Date:   08/19/2023   Follow-up 6 months.  All questions were answered. The patient knows to call the clinic with any problems, questions or concerns.  Earlie Server, MD, PhD [facility] Hematology Oncology 08/19/2022   CHIEF COMPLAINTS/REASON FOR VISIT:  Follow up for left breast cancer  HISTORY OF PRESENTING ILLNESS:   Dorothy Cunningham is a  61 y.o.  female presents to follow-up for Stage Iia left breast cancer Oncological history summary as below. Oncology History  Invasive carcinoma of breast ([place])  11/14/2021 Initial Diagnosis   Invasive carcinoma of breast [facility]) Patient felt a left breast mass. 10/30/2021, bilateral diagnostic mammogram showed 21 mm mass in the left breast, 5:00, 9 cm from nipple.  No suspicious left axillary lymphadenopathy.  No mammographic evidence of malignancy in the right breast.   11/06/2021, left breast ultrasound-guided biopsy showed invasive mammary carcinoma, no special type.  Grade 3, DCIS not identified, LVI not identified.  ER+/PR +HER2 - Menarche 61 years of age, patient has 3 children.  She was 86 years old when she gave birth to her first child.  Short-term OCP use from [id number].  Patient has a history of hysterectomy 1990.  She denies family history of breast cancer. Denies any hormone replacement therapy.  Denies any chest radiation.  She recalls left breast biopsy in 2012.  She did not know details of the pathology.   11/14/2021 Cancer Staging   Staging form: Breast, AJCC 8th Edition - Clinical stage from 11/14/2021: Stage IIA (cT2, cN0, cM0, G3, ER+, PR+, HER2-) - Signed by Earlie Server, MD on 04/28/2022 Stage prefix: Initial diagnosis Histologic grading system: 3 grade  system   01/26/2022 Surgery   Left breast lumpectomy and sentinel lymph node biopsy. Invasive mammary carcinoma, DCIS, 3 lymph nodes negative for malignancy.  Grade 3, all margins negative for invasive carcinoma.  Distance from closest margin 4 mm.  All margins negative for DCIS. pT2 pN0   01/26/2022 Oncotype testing   Oncotype DX breast recurrence score  14   04/01/2022 - 04/14/2022 Radiation Therapy   Adjuvant left breast radiation   06/29/2022 Imaging   DEXA normal bone density    Patient reports feeling well.  She tolerates radiation and has finished.    Review of Systems  Constitutional:  Negative for appetite change, chills, fatigue and fever.  HENT:   Negative for hearing loss and voice change.   Eyes:  Negative for eye problems.  Respiratory:  Negative for chest tightness and cough.   Cardiovascular:  Negative for chest pain.  Gastrointestinal:  Negative for abdominal distention, abdominal pain and blood in stool.  Endocrine: Negative for hot flashes.  Genitourinary:  Negative for difficulty urinating and frequency.   Musculoskeletal:  Negative for arthralgias.  Skin:  Negative for itching and rash.  Neurological:  Negative for extremity weakness.  Hematological:  Negative for adenopathy.  Psychiatric/Behavioral:  Negative for confusion.     MEDICAL HISTORY:  Past Medical History:  Diagnosis Date   Allergic rhinitis    Anxiety    Breast cancer, left breast ([place]) 10/2021   Chronic kidney disease, stage 3a (HCC)    COPD (chronic obstructive pulmonary disease) ([place])    History of epilepsy    as a teenager   Hyperlipidemia    Hypertension    Morbid obesity with BMI of 45.0-49.9, adult ([place])    Sleep apnea     SURGICAL HISTORY: Past Surgical History:  Procedure Laterality Date   Bladder Tuck  1995   BREAST BIOPSY Left 2014   benign   BREAST BIOPSY Left 11/06/2021   u/s bx  5 o'clock, VENUS clip- INVASIVE MAMMARY CARCINOMA,   BREAST LUMPECTOMY WITH SENTINEL LYMPH  NODE BIOPSY Left 01/26/2022   Procedure: BREAST LUMPECTOMY WITH SENTINEL LYMPH NODE BX;  Surgeon: Robert Bellow, MD;  Location: [facility] ORS;  Service: General;  Laterality: Left;   COLONOSCOPY     PARTIAL HYSTERECTOMY  1995   due to heavy periods    SOCIAL HISTORY: Social History   Socioeconomic History   Marital status: Married    Spouse name: Charles   Number of children: 4   Years of education: Not on file   Highest education level: Not on file  Occupational History   Not on file  Tobacco Use   Smoking status: Former    Packs/day: 0.75    Years: 40.00    Total pack years: 30.00    Types: Cigarettes    Quit date: 09/28/2014    Years since quitting: 7.8  Smokeless tobacco: Never  Vaping Use   Vaping Use: Former  Substance and Sexual Activity   Alcohol use: No   Drug use: No   Sexual activity: Yes  Other Topics Concern   Not on file  Social History Narrative   Not on file   Social Determinants of Health   Financial Resource Strain: Low Risk  (12/01/2021)   Overall Financial Resource Strain (CARDIA)    Difficulty of Paying Living Expenses: Not hard at all  Food Insecurity: No Food Insecurity (12/01/2021)   Hunger Vital Sign    Worried About Running Out of Food in the Last Year: Never true    Ran Out of Food in the Last Year: Never true  Transportation Needs: No Transportation Needs (12/01/2021)   PRAPARE - Hydrologist (Medical): No    Lack of Transportation (Non-Medical): No  Physical Activity: Inactive (12/01/2021)   Exercise Vital Sign    Days of Exercise per Week: 0 days    Minutes of Exercise per Session: 0 min  Stress: Stress Concern Present (12/01/2021)   Regino Ramirez    Feeling of Stress : To some extent  Social Connections: Moderately Integrated (12/01/2021)   Social Connection and Isolation Panel [NHANES]    Frequency of Communication with Friends and Family: Twice a  week    Frequency of Social Gatherings with Friends and Family: Once a week    Attends Religious Services: 1 to 4 times per year    Active Member of [employer] or Organizations: No    Attends Archivist Meetings: Never    Marital Status: Married  Human resources officer Violence: Not At Risk (12/01/2021)   Humiliation, Afraid, Rape, and Kick questionnaire    Fear of Current or Ex-Partner: No    Emotionally Abused: No    Physically Abused: No    Sexually Abused: No    FAMILY HISTORY: Family History  Problem Relation Age of Onset   Thyroid disease Mother    Asthma Mother    Osteoarthritis Father    CAD Father    Diabetes Maternal Uncle    Diabetes Maternal Grandfather    Tuberculosis Maternal Grandfather    Diabetes Paternal Grandmother    Heart attack Paternal Grandfather    Breast cancer Other        great grandmother    ALLERGIES:  is allergic to penicillins.  MEDICATIONS:  Current Outpatient Medications  Medication Sig Dispense Refill   albuterol (VENTOLIN HFA) 108 (90 Base) MCG/ACT inhaler INHALE 2 PUFFS INTO THE LUNGS EVERY 6 HOURS AS NEEDED FOR WHEEZING OR SHORTNESS OF BREATH 6.7 g 0   amLODipine (NORVASC) 2.5 MG tablet Take 1 tablet (2.5 mg total) by mouth daily. 45 tablet 4   aspirin 81 MG EC tablet Take 81 mg by mouth daily.     atorvastatin (LIPITOR) 20 MG tablet TAKE 1 TABLET(20 MG) BY MOUTH DAILY 90 tablet 2   b complex vitamins capsule Take 1 capsule by mouth daily.     buPROPion (WELLBUTRIN XL) 300 MG 24 hr tablet TAKE 1 TABLET(300 MG) BY MOUTH DAILY 90 tablet 4   Calcium-Magnesium-Vitamin D (CALCIUM 1200+D3 PO)      Cholecalciferol (VITAMIN D) 50 MCG (2000 UT) CAPS Take 4,000 Units by mouth daily.     diclofenac Sodium (VOLTAREN) 1 % GEL APPLY 2 GRAMS TOPICALLY TO THE AFFECTED AREA FOUR TIMES DAILY (Patient taking differently: Apply 2 g topically 4 (four)  times daily as needed.) 100 g 0   diphenhydrAMINE (BENADRYL) 25 MG tablet Take 25 mg by mouth daily.      fluticasone (FLONASE) 50 MCG/ACT nasal spray SHAKE LIQUID AND USE 2 SPRAYS IN EACH NOSTRIL DAILY 48 g 6   Garlic (GARLIQUE PO) Take 1 tablet by mouth daily.     losartan (COZAAR) 100 MG tablet Take 1 tablet (100 mg total) by mouth daily. 90 tablet 4   Multiple Vitamin (MULTIVITAMIN) tablet Take 1 tablet by mouth daily.     nystatin ointment (MYCOSTATIN) APPLY TOPICALLY TO THE AFFECTED AREA THREE TIMES DAILY (Patient taking differently: Apply 1 application  topically daily as needed (rash).) 60 g 3   Omega-3 Fatty Acids (FISH OIL PO) Take 1 capsule by mouth daily.     umeclidinium-vilanterol (ANORO ELLIPTA) 62.5-25 MCG/ACT AEPB Inhale 1 puff into the lungs daily at 6 (six) AM. 60 each 4   anastrozole (ARIMIDEX) 1 MG tablet TAKE 1 TABLET(1 MG) BY MOUTH DAILY 30 tablet 6   No current facility-administered medications for this visit.     PHYSICAL EXAMINATION: ECOG PERFORMANCE STATUS: 0 - Asymptomatic Vitals:   08/19/22 0943  BP: (!) 204/93  Pulse: 97  Temp: 99 F (37.2 C)  SpO2: 98%   Filed Weights   08/19/22 0943  Weight: 257 lb 9.6 oz (116.8 kg)    Physical Exam Constitutional:      General: She is not in acute distress. HENT:     Head: Normocephalic and atraumatic.  Eyes:     General: No scleral icterus. Cardiovascular:     Rate and Rhythm: Normal rate and regular rhythm.     Heart sounds: Normal heart sounds.  Pulmonary:     Effort: Pulmonary effort is normal. No respiratory distress.     Breath sounds: No wheezing.  Abdominal:     General: Bowel sounds are normal. There is no distension.     Palpations: Abdomen is soft.  Musculoskeletal:        General: No deformity. Normal range of motion.     Cervical back: Normal range of motion and neck supple.  Skin:    General: Skin is warm and dry.     Findings: No erythema or rash.  Neurological:     Mental Status: She is alert and oriented to person, place, and time. Mental status is at baseline.     Cranial Nerves: No  cranial nerve deficit.     Coordination: Coordination normal.  Psychiatric:        Mood and Affect: Mood normal.     LABORATORY DATA:  I have reviewed the data as listed     Latest Ref Rng & Units 08/19/2022    9:30 AM 04/07/2022    2:30 PM 11/14/2021   12:08 PM  CBC  WBC 4.0 - 10.5 K/uL 8.6  9.9  9.3   Hemoglobin 12.0 - 15.0 g/dL 15.0  14.3  15.1   Hematocrit 36.0 - 46.0 % 42.9  42.0  43.8   Platelets 150 - 400 K/uL 301  281  307       Latest Ref Rng & Units 08/19/2022    9:30 AM 08/14/2022   11:14 AM 07/07/2022    3:21 PM  CMP  Glucose 70 - 99 mg/dL 102  91  86   BUN 8 - 23 mg/dL _0 Creatinine 0.44 - 1.00 mg/dL 1.28  1.21  1.19   Sodium 135 - 145  mmol/L 138  143  140   Potassium 3.5 - 5.1 mmol/L 3.9  4.0  4.0   Chloride 98 - 111 mmol/L 105  104  103   CO2 22 - 32 mmol/L _0 Calcium 8.9 - 10.3 mg/dL 9.1  9.7  10.0   Total Protein 6.5 - 8.1 g/dL 7.7     Total Bilirubin 0.3 - 1.2 mg/dL 0.5     Alkaline Phos 38 - 126 U/L 63     AST 15 - 41 U/L 39     ALT 0 - 44 U/L 48         RADIOGRAPHIC STUDIES: I have personally reviewed the radiological images as listed and agreed with the findings in the report. No results found.

## 2022-08-19 NOTE — Progress Notes (Signed)
Patient is here for follow-up of breast cancer. Patient is scheduled to have gallbladder surgery on 11/29.

## 2022-08-19 NOTE — Assessment & Plan Note (Addendum)
Stage IIA left breast cancer, pT2 pN0 ER+, PR+, HER- Oncotype Dx 14, no adjuvant chemotherapy benefit. S/p adjuvant Radiation.  Patient tolerates Arimidex  Labs are reviewed and discussed with patient. Continue Arimidex 1 mg daily.  Refills were sent to pharmacy. Obtain annual diagnostic mammogram in February 2023

## 2022-08-19 NOTE — Progress Notes (Signed)
  Perioperative Services Pre-Admission/Anesthesia Testing     Date: 08/19/22  Name: Dorothy Cunningham MRN:   [id number]  Re: Change in [place] for upcoming surgery   Case: [id number] Date/Time: 08/26/22 0715   Procedures:      XI ROBOTIC ASSISTED LAPAROSCOPIC CHOLECYSTECTOMY     INDOCYANINE [place] ([facility])   Anesthesia type: General   Pre-op diagnosis: porcelain gallbladder   Location: [place] OR ROOM 04 / [place] ORS FOR ANESTHESIA GROUP   Surgeons: Ronny Bacon, MD   Primary attending surgeon was consulted regarding consideration of therapeutic change in antimicrobial agent being used for preoperative prophylaxis in this patient's upcoming surgical case. Following analysis of the risk versus benefits, the patient's primary attending surgeon advised that it would be acceptable to discontinue the ordered ciprofloxacin and place an order for cefazolin 2 gm IV on call to the OR. Orders for this patient were amended by me following collaborative conversation with attending surgeon taking into consideration of risk versus benefits associated with the change in therapy.  Honor Loh, MSN, APRN, FNP-C, CEN [facility]  Peri-operative Services Nurse Practitioner Phone: [phone] 08/19/22 9:42 AM

## 2022-08-19 NOTE — Assessment & Plan Note (Signed)
Recommend calcium and vitamin D supplementation DEXA showed normal bone density.

## 2022-08-22 ENCOUNTER — Encounter: Payer: Self-pay | Admitting: Surgery

## 2022-08-26 ENCOUNTER — Encounter
Admission: RE | Disposition: A | Discharge status: Home / Self Care | Payer: Self-pay | Source: Ambulatory Visit | Attending: Surgery

## 2022-08-26 ENCOUNTER — Ambulatory Visit
Admission: RE | Admit: 2022-08-26 | Discharge: 2022-08-26 | Disposition: A | Discharge status: Home / Self Care | Payer: Federal, State, Local not specified - PPO | Source: Ambulatory Visit | Attending: Surgery | Admitting: Surgery

## 2022-08-26 ENCOUNTER — Ambulatory Visit: Payer: Federal, State, Local not specified - PPO | Admitting: Urgent Care

## 2022-08-26 ENCOUNTER — Other Ambulatory Visit: Payer: Self-pay

## 2022-08-26 ENCOUNTER — Encounter: Payer: Self-pay | Admitting: Surgery

## 2022-08-26 DIAGNOSIS — J449 Chronic obstructive pulmonary disease, unspecified: Secondary | ICD-10-CM | POA: Insufficient documentation

## 2022-08-26 DIAGNOSIS — K811 Chronic cholecystitis: Secondary | ICD-10-CM | POA: Insufficient documentation

## 2022-08-26 DIAGNOSIS — I10 Essential (primary) hypertension: Secondary | ICD-10-CM | POA: Insufficient documentation

## 2022-08-26 DIAGNOSIS — N289 Disorder of kidney and ureter, unspecified: Secondary | ICD-10-CM | POA: Diagnosis not present

## 2022-08-26 DIAGNOSIS — F419 Anxiety disorder, unspecified: Secondary | ICD-10-CM | POA: Diagnosis not present

## 2022-08-26 DIAGNOSIS — K828 Other specified diseases of gallbladder: Secondary | ICD-10-CM | POA: Diagnosis not present

## 2022-08-26 DIAGNOSIS — Z87891 Personal history of nicotine dependence: Secondary | ICD-10-CM | POA: Insufficient documentation

## 2022-08-26 DIAGNOSIS — Z6841 Body Mass Index (BMI) 40.0 and over, adult: Secondary | ICD-10-CM | POA: Insufficient documentation

## 2022-08-26 DIAGNOSIS — N1831 Chronic kidney disease, stage 3a: Secondary | ICD-10-CM | POA: Diagnosis not present

## 2022-08-26 DIAGNOSIS — K819 Cholecystitis, unspecified: Secondary | ICD-10-CM | POA: Diagnosis not present

## 2022-08-26 DIAGNOSIS — I129 Hypertensive chronic kidney disease with stage 1 through stage 4 chronic kidney disease, or unspecified chronic kidney disease: Secondary | ICD-10-CM | POA: Diagnosis not present

## 2022-08-26 DIAGNOSIS — F32A Depression, unspecified: Secondary | ICD-10-CM | POA: Diagnosis not present

## 2022-08-26 HISTORY — PX: CHOLECYSTECTOMY: SHX55

## 2022-08-26 SURGERY — CHOLECYSTECTOMY, ROBOT-ASSISTED, LAPAROSCOPIC
Anesthesia: General

## 2022-08-26 MED ORDER — CHLORHEXIDINE GLUCONATE CLOTH 2 % EX PADS: 6.0000 | MEDICATED_PAD | Freq: Once | CUTANEOUS | Status: DC

## 2022-08-26 MED ORDER — LIDOCAINE HCL (PF) 2 % IJ SOLN
INTRAMUSCULAR | Status: AC
  Filled 2022-08-26: qty 5

## 2022-08-26 MED ORDER — LACTATED RINGERS IV SOLN: INTRAVENOUS | Status: DC

## 2022-08-26 MED ORDER — INDOCYANINE GREEN 25 MG IV SOLR
1.2500 mg | Freq: Once | INTRAVENOUS | Status: AC
  Administered 2022-08-26: 1.25 mg via INTRAVENOUS
  Filled 2022-08-26: qty 0.5

## 2022-08-26 MED ORDER — WATER FOR IRRIGATION, STERILE IR SOLN
Status: DC | PRN
  Administered 2022-08-26: 1500 mL

## 2022-08-26 MED ORDER — HYDROMORPHONE HCL 1 MG/ML IJ SOLN: 0.2500 mg | INTRAMUSCULAR | Status: DC | PRN

## 2022-08-26 MED ORDER — ROCURONIUM BROMIDE 100 MG/10ML IV SOLN
INTRAVENOUS | Status: DC | PRN
  Administered 2022-08-26: 70 mg via INTRAVENOUS
  Administered 2022-08-26: 20 mg via INTRAVENOUS
  Administered 2022-08-26: 30 mg via INTRAVENOUS

## 2022-08-26 MED ORDER — OXYCODONE HCL 5 MG PO TABS: 5.0000 mg | ORAL_TABLET | Freq: Four times a day (QID) | ORAL | 0 refills | Status: DC | PRN

## 2022-08-26 MED ORDER — CEFAZOLIN SODIUM-DEXTROSE 2-4 GM/100ML-% IV SOLN
2.0000 g | Freq: Once | INTRAVENOUS | Status: AC
  Administered 2022-08-26: 2 g via INTRAVENOUS

## 2022-08-26 MED ORDER — CELECOXIB 200 MG PO CAPS: 200.0000 mg | ORAL_CAPSULE | ORAL | Status: AC

## 2022-08-26 MED ORDER — DEXMEDETOMIDINE HCL IN NACL 80 MCG/20ML IV SOLN
INTRAVENOUS | Status: AC
  Filled 2022-08-26: qty 20

## 2022-08-26 MED ORDER — DEXAMETHASONE SODIUM PHOSPHATE 10 MG/ML IJ SOLN
INTRAMUSCULAR | Status: AC
  Filled 2022-08-26: qty 1

## 2022-08-26 MED ORDER — FENTANYL CITRATE (PF) 100 MCG/2ML IJ SOLN
INTRAMUSCULAR | Status: DC | PRN
  Administered 2022-08-26: 50 ug via INTRAVENOUS
  Administered 2022-08-26: 100 ug via INTRAVENOUS
  Administered 2022-08-26: 50 ug via INTRAVENOUS

## 2022-08-26 MED ORDER — LIDOCAINE HCL (CARDIAC) PF 100 MG/5ML IV SOSY
PREFILLED_SYRINGE | INTRAVENOUS | Status: DC | PRN
  Administered 2022-08-26: 100 mg via INTRAVENOUS

## 2022-08-26 MED ORDER — CHLORHEXIDINE GLUCONATE 0.12 % MT SOLN
OROMUCOSAL | Status: AC
  Administered 2022-08-26: 15 mL via OROMUCOSAL
  Filled 2022-08-26: qty 15

## 2022-08-26 MED ORDER — HYDROMORPHONE HCL 1 MG/ML IJ SOLN
INTRAMUSCULAR | Status: AC
  Filled 2022-08-26: qty 1

## 2022-08-26 MED ORDER — CHLORHEXIDINE GLUCONATE 0.12 % MT SOLN: 15.0000 mL | Freq: Once | OROMUCOSAL | Status: AC

## 2022-08-26 MED ORDER — BUPIVACAINE-EPINEPHRINE (PF) 0.25% -1:200000 IJ SOLN
INTRAMUSCULAR | Status: DC | PRN
  Administered 2022-08-26: 30 mL

## 2022-08-26 MED ORDER — MIDAZOLAM HCL 2 MG/2ML IJ SOLN
INTRAMUSCULAR | Status: DC | PRN
  Administered 2022-08-26: 2 mg via INTRAVENOUS

## 2022-08-26 MED ORDER — ONDANSETRON HCL 4 MG/2ML IJ SOLN
INTRAMUSCULAR | Status: AC
  Filled 2022-08-26: qty 2

## 2022-08-26 MED ORDER — MIDAZOLAM HCL 2 MG/2ML IJ SOLN
INTRAMUSCULAR | Status: AC
  Filled 2022-08-26: qty 2

## 2022-08-26 MED ORDER — FENTANYL CITRATE (PF) 100 MCG/2ML IJ SOLN
INTRAMUSCULAR | Status: AC
  Filled 2022-08-26: qty 2

## 2022-08-26 MED ORDER — OXYCODONE HCL 5 MG PO TABS: 5.0000 mg | ORAL_TABLET | Freq: Once | ORAL | Status: AC | PRN

## 2022-08-26 MED ORDER — CEFAZOLIN SODIUM-DEXTROSE 2-4 GM/100ML-% IV SOLN
INTRAVENOUS | Status: AC
  Filled 2022-08-26: qty 100

## 2022-08-26 MED ORDER — FAMOTIDINE 20 MG PO TABS
ORAL_TABLET | ORAL | Status: AC
  Administered 2022-08-26: 20 mg via ORAL
  Filled 2022-08-26: qty 1

## 2022-08-26 MED ORDER — ROCURONIUM BROMIDE 10 MG/ML (PF) SYRINGE
PREFILLED_SYRINGE | INTRAVENOUS | Status: AC
  Filled 2022-08-26: qty 10

## 2022-08-26 MED ORDER — ACETAMINOPHEN 500 MG PO TABS: 1000.0000 mg | ORAL_TABLET | ORAL | Status: AC

## 2022-08-26 MED ORDER — OXYCODONE HCL 5 MG/5ML PO SOLN: 5.0000 mg | Freq: Once | ORAL | Status: AC | PRN

## 2022-08-26 MED ORDER — BUPIVACAINE LIPOSOME 1.3 % IJ SUSP: 20.0000 mL | Freq: Once | INTRAMUSCULAR | Status: DC

## 2022-08-26 MED ORDER — CHLORHEXIDINE GLUCONATE CLOTH 2 % EX PADS
6.0000 | MEDICATED_PAD | Freq: Once | CUTANEOUS | Status: AC
  Administered 2022-08-26: 6 via TOPICAL

## 2022-08-26 MED ORDER — PHENYLEPHRINE HCL (PRESSORS) 10 MG/ML IV SOLN
INTRAVENOUS | Status: DC | PRN
  Administered 2022-08-26 (×2): 80 ug via INTRAVENOUS

## 2022-08-26 MED ORDER — DEXAMETHASONE SODIUM PHOSPHATE 10 MG/ML IJ SOLN
INTRAMUSCULAR | Status: DC | PRN
  Administered 2022-08-26: 10 mg via INTRAVENOUS

## 2022-08-26 MED ORDER — DEXMEDETOMIDINE HCL IN NACL 80 MCG/20ML IV SOLN
INTRAVENOUS | Status: DC | PRN
  Administered 2022-08-26: 12 ug via BUCCAL

## 2022-08-26 MED ORDER — BUPIVACAINE-EPINEPHRINE (PF) 0.25% -1:200000 IJ SOLN
INTRAMUSCULAR | Status: AC
  Filled 2022-08-26: qty 30

## 2022-08-26 MED ORDER — GABAPENTIN 300 MG PO CAPS: 300.0000 mg | ORAL_CAPSULE | ORAL | Status: AC

## 2022-08-26 MED ORDER — CELECOXIB 200 MG PO CAPS
ORAL_CAPSULE | ORAL | Status: AC
  Administered 2022-08-26: 200 mg via ORAL
  Filled 2022-08-26: qty 1

## 2022-08-26 MED ORDER — PHENYLEPHRINE 80 MCG/ML (10ML) SYRINGE FOR IV PUSH (FOR BLOOD PRESSURE SUPPORT)
PREFILLED_SYRINGE | INTRAVENOUS | Status: AC
  Filled 2022-08-26: qty 10

## 2022-08-26 MED ORDER — GABAPENTIN 300 MG PO CAPS
ORAL_CAPSULE | ORAL | Status: AC
  Administered 2022-08-26: 300 mg via ORAL
  Filled 2022-08-26: qty 1

## 2022-08-26 MED ORDER — PROPOFOL 10 MG/ML IV BOLUS
INTRAVENOUS | Status: AC
  Filled 2022-08-26: qty 20

## 2022-08-26 MED ORDER — ORAL CARE MOUTH RINSE: 15.0000 mL | Freq: Once | OROMUCOSAL | Status: AC

## 2022-08-26 MED ORDER — OXYCODONE HCL 5 MG PO TABS
ORAL_TABLET | ORAL | Status: AC
  Administered 2022-08-26: 5 mg
  Filled 2022-08-26: qty 1

## 2022-08-26 MED ORDER — FAMOTIDINE 20 MG PO TABS: 20.0000 mg | ORAL_TABLET | Freq: Once | ORAL | Status: AC

## 2022-08-26 MED ORDER — PROPOFOL 10 MG/ML IV BOLUS
INTRAVENOUS | Status: DC | PRN
  Administered 2022-08-26: 200 mg via INTRAVENOUS

## 2022-08-26 MED ORDER — SUCCINYLCHOLINE CHLORIDE 200 MG/10ML IV SOSY
PREFILLED_SYRINGE | INTRAVENOUS | Status: AC
  Filled 2022-08-26: qty 10

## 2022-08-26 MED ORDER — EPHEDRINE 5 MG/ML INJ
INTRAVENOUS | Status: AC
  Filled 2022-08-26: qty 5

## 2022-08-26 MED ORDER — HYDROMORPHONE HCL 1 MG/ML IJ SOLN
INTRAMUSCULAR | Status: DC | PRN
  Administered 2022-08-26 (×2): .25 mg via INTRAVENOUS

## 2022-08-26 MED ORDER — SUGAMMADEX SODIUM 200 MG/2ML IV SOLN
INTRAVENOUS | Status: DC | PRN
  Administered 2022-08-26: 240 mg via INTRAVENOUS

## 2022-08-26 MED ORDER — IBUPROFEN 800 MG PO TABS: 800.0000 mg | ORAL_TABLET | Freq: Three times a day (TID) | ORAL | 0 refills | Status: DC | PRN

## 2022-08-26 MED ORDER — ACETAMINOPHEN 500 MG PO TABS
ORAL_TABLET | ORAL | Status: AC
  Administered 2022-08-26: 1000 mg via ORAL
  Filled 2022-08-26: qty 2

## 2022-08-26 MED ORDER — ONDANSETRON HCL 4 MG/2ML IJ SOLN
INTRAMUSCULAR | Status: DC | PRN
  Administered 2022-08-26: 4 mg via INTRAVENOUS

## 2022-08-26 SURGICAL SUPPLY — 54 items
ADH SKN CLS APL DERMABOND .7 (GAUZE/BANDAGES/DRESSINGS) ×1
BAG PRESSURE INF REUSE 3000 (BAG) IMPLANT
BULB RESERV EVAC DRAIN JP 100C (MISCELLANEOUS) IMPLANT
CLIP LIGATING HEM O LOK PURPLE (MISCELLANEOUS) ×1 IMPLANT
COVER TIP SHEARS 8 DVNC (MISCELLANEOUS) ×1 IMPLANT
COVER TIP SHEARS 8MM DA VINCI (MISCELLANEOUS) ×1
DERMABOND ADVANCED .7 DNX12 (GAUZE/BANDAGES/DRESSINGS) ×1 IMPLANT
DRAIN CHANNEL JP 19F (MISCELLANEOUS) IMPLANT
DRAPE ARM DVNC X/XI (DISPOSABLE) ×4 IMPLANT
DRAPE COLUMN DVNC XI (DISPOSABLE) ×1 IMPLANT
DRAPE DA VINCI XI ARM (DISPOSABLE) ×4
DRAPE DA VINCI XI COLUMN (DISPOSABLE) ×1
ELECT CAUTERY BLADE 6.4 (BLADE) ×1 IMPLANT
GLOVE ORTHO TXT STRL SZ7.5 (GLOVE) ×2 IMPLANT
GOWN STRL REUS W/ TWL LRG LVL3 (GOWN DISPOSABLE) ×2 IMPLANT
GOWN STRL REUS W/ TWL XL LVL3 (GOWN DISPOSABLE) ×2 IMPLANT
GOWN STRL REUS W/TWL LRG LVL3 (GOWN DISPOSABLE) ×4
GOWN STRL REUS W/TWL XL LVL3 (GOWN DISPOSABLE) ×2
GRASPER SUT TROCAR 14GX15 (MISCELLANEOUS) IMPLANT
IRRIGATION STRYKERFLOW (MISCELLANEOUS) IMPLANT
IRRIGATOR STRYKERFLOW (MISCELLANEOUS)
IRRIGATOR SUCT 8 DISP DVNC XI (IRRIGATION / IRRIGATOR) IMPLANT
IRRIGATOR SUCTION 8MM XI DISP (IRRIGATION / IRRIGATOR) ×1
IV NS IRRIG 3000ML ARTHROMATIC (IV SOLUTION) IMPLANT
KIT PINK PAD W/HEAD ARE REST (MISCELLANEOUS) ×1
KIT PINK PAD W/HEAD ARM REST (MISCELLANEOUS) ×1 IMPLANT
KIT TURNOVER KIT A (KITS) ×1 IMPLANT
LABEL OR SOLS (LABEL) ×1 IMPLANT
MANIFOLD NEPTUNE II (INSTRUMENTS) ×1 IMPLANT
NDL INSUFFLATION 14GA 120MM (NEEDLE) IMPLANT
NEEDLE HYPO 22GX1.5 SAFETY (NEEDLE) ×1 IMPLANT
NEEDLE INSUFFLATION 14GA 120MM (NEEDLE) IMPLANT
NS IRRIG 500ML POUR BTL (IV SOLUTION) ×1 IMPLANT
PACK LAP CHOLECYSTECTOMY (MISCELLANEOUS) ×1 IMPLANT
PENCIL SMOKE EVACUATOR COATED (MISCELLANEOUS) IMPLANT
SEAL CANN UNIV 5-8 DVNC XI (MISCELLANEOUS) ×4 IMPLANT
SEAL XI 5MM-8MM UNIVERSAL (MISCELLANEOUS) ×4
SET TUBE SMOKE EVAC HIGH FLOW (TUBING) ×1 IMPLANT
SOLUTION ELECTROLUBE (MISCELLANEOUS) ×1 IMPLANT
SPIKE FLUID TRANSFER (MISCELLANEOUS) ×1 IMPLANT
SPONGE DRAIN TRACH 4X4 STRL 2S (GAUZE/BANDAGES/DRESSINGS) IMPLANT
SUT ETHILON 3-0 FS-10 30 BLK (SUTURE) ×1
SUT MNCRL 4-0 (SUTURE) ×1
SUT MNCRL 4-0 27XMFL (SUTURE) ×1
SUT PDS PLUS 0 (SUTURE) ×1
SUT PDS PLUS AB 0 CT-2 (SUTURE) IMPLANT
SUT VICRYL 0 UR6 27IN ABS (SUTURE) ×1 IMPLANT
SUTURE EHLN 3-0 FS-10 30 BLK (SUTURE) IMPLANT
SUTURE MNCRL 4-0 27XMF (SUTURE) ×1 IMPLANT
SYS BAG RETRIEVAL 10MM (BASKET) ×1
SYSTEM BAG RETRIEVAL 10MM (BASKET) ×1 IMPLANT
TRAP FLUID SMOKE EVACUATOR (MISCELLANEOUS) ×1 IMPLANT
TROCAR Z-THREAD FIOS 11X100 BL (TROCAR) IMPLANT
WATER STERILE IRR 500ML POUR (IV SOLUTION) ×1 IMPLANT

## 2022-08-26 NOTE — Anesthesia Procedure Notes (Addendum)
Procedure Name: Intubation Date/Time: 08/26/2022 7:40 AM  Performed by: Rande Brunt, CRNAPre-anesthesia Checklist: Patient identified, Patient being monitored, Timeout performed, Emergency Drugs available and Suction available Patient Re-evaluated:Patient Re-evaluated prior to induction Oxygen Delivery Method: Circle system utilized Preoxygenation: Pre-oxygenation with 100% oxygen Induction Type: IV induction Ventilation: Mask ventilation without difficulty and Oral airway inserted - appropriate to patient size Laryngoscope Size: 3 and McGraph Grade View: Grade I Tube type: Oral Tube size: 7.0 mm Number of attempts: 1 Airway Equipment and Method: Stylet Placement Confirmation: ETT inserted through vocal cords under direct vision, positive ETCO2 and breath sounds checked- equal and bilateral Secured at: 22 cm Tube secured with: Tape Dental Injury: Teeth and Oropharynx as per pre-operative assessment

## 2022-08-26 NOTE — Interval H&P Note (Signed)
History and Physical Interval Note:  08/26/2022 7:24 AM  Dorothy Cunningham  has presented today for surgery, with the diagnosis of porcelain gallbladder.  The various methods of treatment have been discussed with the patient and family. After consideration of risks, benefits and other options for treatment, the patient has consented to  Procedure(s): XI ROBOTIC ASSISTED LAPAROSCOPIC CHOLECYSTECTOMY (N/A) [place] ([facility]) (N/A) as a surgical intervention.  The patient's history has been reviewed, patient examined, no change in status, stable for surgery.  I have reviewed the patient's chart and labs.  Questions were answered to the patient's satisfaction.     Ronny Bacon

## 2022-08-26 NOTE — Anesthesia Preprocedure Evaluation (Signed)
Anesthesia Evaluation  Patient identified by MRN, date of birth, ID band Patient awake    Reviewed: Allergy & Precautions, NPO status , Patient's Chart, lab work & pertinent test results  History of Anesthesia Complications Negative for: history of anesthetic complications  Airway Mallampati: III  TM Distance: >3 FB Neck ROM: full    Dental  (+) Chipped   Pulmonary sleep apnea , COPD, former smoker   Pulmonary exam normal        Cardiovascular hypertension, On Medications negative cardio ROS Normal cardiovascular exam     Neuro/Psych  PSYCHIATRIC DISORDERS Anxiety Depression    negative neurological ROS     GI/Hepatic negative GI ROS, Neg liver ROS,,,  Endo/Other    Morbid obesity  Renal/GU Renal disease     Musculoskeletal   Abdominal   Peds  Hematology negative hematology ROS (+)   Anesthesia Other Findings Past Medical History: No date: Allergic rhinitis No date: Anxiety 10/2021: Breast cancer, left breast (HCC) No date: Chronic kidney disease, stage 3a (HCC) No date: COPD (chronic obstructive pulmonary disease) (HCC) No date: History of epilepsy     Comment:  as a teenager No date: Hyperlipidemia No date: Hypertension No date: Morbid obesity with BMI of 45.0-49.9, adult ([place]) No date: Sleep apnea  Past Surgical History: 1995: Bladder Tuck 2014: BREAST BIOPSY; Left     Comment:  benign 11/06/2021: BREAST BIOPSY; Left     Comment:  u/s bx  5 o'clock, VENUS clip- INVASIVE MAMMARY               CARCINOMA, 01/26/2022: BREAST LUMPECTOMY WITH SENTINEL LYMPH NODE BIOPSY; Left     Comment:  Procedure: BREAST LUMPECTOMY WITH SENTINEL LYMPH NODE               BX;  Surgeon: Robert Bellow, MD;  Location: [facility]               ORS;  Service: General;  Laterality: Left; No date: COLONOSCOPY 1995: PARTIAL HYSTERECTOMY     Comment:  due to heavy periods  BMI    Body Mass Index: 48.69 kg/m       Reproductive/Obstetrics negative OB ROS                             Anesthesia Physical Anesthesia Plan  ASA: 3  Anesthesia Plan: General ETT   Post-op Pain Management: Dilaudid IV, Gabapentin PO (pre-op)*, Celebrex PO (pre-op)* and Tylenol PO (pre-op)*   Induction: Intravenous  PONV Risk Score and Plan: Ondansetron, Dexamethasone, Midazolam and Treatment may vary due to age or medical condition  Airway Management Planned: Oral ETT  Additional Equipment:   Intra-op Plan:   Post-operative Plan: Extubation in OR  Informed Consent: I have reviewed the patients History and Physical, chart, labs and discussed the procedure including the risks, benefits and alternatives for the proposed anesthesia with the patient or authorized representative who has indicated his/her understanding and acceptance.     Dental Advisory Given  Plan Discussed with: Anesthesiologist, CRNA and Surgeon  Anesthesia Plan Comments: (Patient consented for risks of anesthesia including but not limited to:  - adverse reactions to medications - damage to eyes, teeth, lips or other oral mucosa - nerve damage due to positioning  - sore throat or hoarseness - Damage to heart, brain, nerves, lungs, other parts of body or loss of life  Patient voiced understanding.)        Anesthesia Quick  Evaluation

## 2022-08-26 NOTE — H&P (Signed)
Chief Complaint:   Porcelain gallbladder   History of Present Illness Dorothy Cunningham is a 61 y.o. female with a known porcelain gallbladder for 2 years, incidentally identified on a screening chest CT scan.  She reports no remarkable's postprandial symptoms.  Perhaps a mild degree of postprandial discomfort.  Otherwise she denies nauseous, vomiting, fevers or chills.   Past Medical History     Past Medical History:  Diagnosis Date   Allergic rhinitis     Anxiety     COPD (chronic obstructive pulmonary disease) (HCC)     History of epilepsy      as a teenager   Hyperlipidemia     Hypertension     Sleep apnea               Past Surgical History:  Procedure Laterality Date   Bladder Tuck   1995   BREAST BIOPSY Left 2014    benign   BREAST BIOPSY Left 11/06/2021    u/s bx  5 o'clock, VENUS clip- INVASIVE MAMMARY CARCINOMA,   BREAST LUMPECTOMY WITH SENTINEL LYMPH NODE BIOPSY Left 01/26/2022    Procedure: BREAST LUMPECTOMY WITH SENTINEL LYMPH NODE BX;  Surgeon: Robert Bellow, MD;  Location: [facility] ORS;  Service: General;  Laterality: Left;   PARTIAL HYSTERECTOMY   1995    due to heavy periods          Allergies  Allergen Reactions   Penicillins Hives            Current Outpatient Medications  Medication Sig Dispense Refill   albuterol (VENTOLIN HFA) 108 (90 Base) MCG/ACT inhaler INHALE 2 PUFFS INTO THE LUNGS EVERY 6 HOURS AS NEEDED FOR WHEEZING OR SHORTNESS OF BREATH 6.7 g 0   amLODipine (NORVASC) 2.5 MG tablet Take 1 tablet (2.5 mg total) by mouth daily. 45 tablet 4   anastrozole (ARIMIDEX) 1 MG tablet Take 1 tablet (1 mg total) by mouth daily. 30 tablet 2   aspirin 81 MG EC tablet Take 81 mg by mouth daily.       atorvastatin (LIPITOR) 20 MG tablet TAKE 1 TABLET(20 MG) BY MOUTH DAILY 90 tablet 2   b complex vitamins capsule Take 1 capsule by mouth daily.       buPROPion (WELLBUTRIN XL) 300 MG 24 hr tablet TAKE 1 TABLET(300 MG) BY MOUTH DAILY 90 tablet 4    Calcium-Magnesium-Vitamin D (CALCIUM 1200+D3 PO)         Cholecalciferol (VITAMIN D) 50 MCG (2000 UT) CAPS Take 4,000 Units by mouth daily.       diclofenac Sodium (VOLTAREN) 1 % GEL APPLY 2 GRAMS TOPICALLY TO THE AFFECTED AREA FOUR TIMES DAILY 100 g 0   diphenhydrAMINE (BENADRYL) 25 MG tablet Take 25 mg by mouth daily.       fexofenadine (ALLEGRA ALLERGY) 180 MG tablet Take 1 tablet (180 mg total) by mouth daily. 10 tablet 1   fluticasone (FLONASE) 50 MCG/ACT nasal spray SHAKE LIQUID AND USE 2 SPRAYS IN EACH NOSTRIL DAILY 48 g 6   Garlic 10 MG CAPS Take 20 mg by mouth daily.       losartan (COZAAR) 100 MG tablet Take 1 tablet (100 mg total) by mouth daily. 90 tablet 4   Multiple Vitamin (MULTIVITAMIN) tablet Take 1 tablet by mouth daily.       nystatin ointment (MYCOSTATIN) APPLY TOPICALLY TO THE AFFECTED AREA THREE TIMES DAILY (Patient taking differently: Apply 1 application  topically daily as needed (rash).) 60 g 3  Omega-3 Fatty Acids (FISH OIL PO) Take 1 capsule by mouth daily.       umeclidinium-vilanterol (ANORO ELLIPTA) 62.5-25 MCG/ACT AEPB Inhale 1 puff into the lungs daily at 6 (six) AM. 60 each 4    No current facility-administered medications for this visit.      Family History      Family History  Problem Relation Age of Onset   Thyroid disease Mother     Asthma Mother     Osteoarthritis Father     CAD Father     Diabetes Maternal Uncle     Diabetes Maternal Grandfather     Tuberculosis Maternal Grandfather     Diabetes Paternal Grandmother     Heart attack Paternal Grandfather     Breast cancer Other          great grandmother        Social History Social History         Tobacco Use   Smoking status: Former      Packs/day: 0.75      Years: 40.00      Total pack years: 30.00      Types: Cigarettes      Quit date: 09/28/2014      Years since quitting: 7.8   Smokeless tobacco: Never  Vaping Use   Vaping Use: Former  Substance Use Topics   Alcohol use: No    Drug use: No          Review of Systems  Constitutional: Negative.   HENT: Negative.    Eyes: Negative.   Respiratory:  Positive for shortness of breath.   Cardiovascular:  Positive for leg swelling.  Gastrointestinal: Negative.   Genitourinary: Negative.   Skin: Negative.   Neurological: Negative.   Psychiatric/Behavioral: Negative.          Physical Exam Blood pressure (!) 180/102, pulse 95, temperature 98 F (36.7 C), temperature source Oral, height '5\' 1"'$  (1.549 m), weight 257 lb (116.6 kg), SpO2 98 %. Last Weight  Most recent update: 07/14/2022  3:38 PM      Weight  116.6 kg (257 lb)                     CONSTITUTIONAL: Well developed, and nourished, appropriately responsive and aware without distress.   EYES: Sclera non-icteric.   EARS, NOSE, MOUTH AND THROAT: The oropharynx is clear. Oral mucosa is pink and moist.   Hearing is intact to voice.  NECK: Trachea is midline, and there is no jugular venous distension.  LYMPH NODES:  Lymph nodes in the neck are not enlarged. RESPIRATORY:  Lungs are clear, and breath sounds are equal bilaterally. Normal respiratory effort without pathologic use of accessory muscles. CARDIOVASCULAR: Heart is regular in rate and rhythm. GI: The abdomen is Beese, soft, nontender, and nondistended. There were no palpable masses. I did not appreciate hepatosplenomegaly. There were normal bowel sounds. MUSCULOSKELETAL:  Symmetrical muscle tone appreciated in all four extremities.    SKIN: Skin turgor is normal. No pathologic skin lesions appreciated.  NEUROLOGIC:  Motor and sensation appear grossly normal.  Cranial nerves are grossly without defect. PSYCH:  Alert and oriented to person, place and time. Affect is appropriate for situation.   Data Reviewed I have personally reviewed what is currently available of the patient's imaging, recent labs and medical records.   Labs:      Latest Ref Rng & Units 04/07/2022    2:30 PM 11/14/2021   12:08  PM 10/20/2021    3:52 PM  CBC  WBC 4.0 - 10.5 K/uL 9.9  9.3  10.9   Hemoglobin 12.0 - 15.0 g/dL 14.3  15.1  15.9   Hematocrit 36.0 - 46.0 % 42.0  43.8  45.7   Platelets 150 - 400 K/uL 281  307  326         Latest Ref Rng & Units 07/07/2022    3:21 PM 05/26/2022   11:22 AM 11/17/2021    4:18 PM  CMP  Glucose 70 - 99 mg/dL 86  94  89   BUN 8 - 27 mg/dL '19  15  10   '$ Creatinine 0.57 - 1.00 mg/dL 1.19  1.20  1.18   Sodium 134 - 144 mmol/L 140  139  141   Potassium 3.5 - 5.2 mmol/L 4.0  4.0  3.7   Chloride 96 - 106 mmol/L 103  99  102   CO2 20 - 29 mmol/L '20  20  24   '$ Calcium 8.7 - 10.3 mg/dL 10.0  9.6  10.1   Total Protein 6.0 - 8.5 g/dL   7.4     Total Bilirubin 0.0 - 1.2 mg/dL   0.3     Alkaline Phos 44 - 121 IU/L   70     AST 0 - 40 IU/L   36     ALT 0 - 32 IU/L   54             Imaging: Radiology review:  CLINICAL DATA:  61 year old asymptomatic female former smoker with 30 pack-year smoking history, quit smoking in 2016. History of left breast cancer treated with conservation therapy completed July 2023.   * Tracking Code: BO *   EXAM: CT CHEST WITHOUT CONTRAST LOW-DOSE FOR LUNG CANCER SCREENING   TECHNIQUE: Multidetector CT imaging of the chest was performed following the standard protocol without IV contrast.   RADIATION DOSE REDUCTION: This exam was performed according to the departmental dose-optimization program which includes automated exposure control, adjustment of the mA and/or kV according to patient size and/or use of iterative reconstruction technique.   COMPARISON:  04/19/2020 screening chest CT.   FINDINGS: Cardiovascular: Normal heart size. No significant pericardial effusion/thickening. Three-vessel coronary atherosclerosis. Atherosclerotic nonaneurysmal thoracic aorta. Normal caliber pulmonary arteries.   Mediastinum/Nodes: No significant thyroid nodules. Unremarkable esophagus. No pathologically enlarged axillary, mediastinal or hilar lymph  nodes, noting limited sensitivity for the detection of hilar adenopathy on this noncontrast study.   Lungs/Pleura: No pneumothorax. No pleural effusion. Mild centrilobular emphysema with diffuse bronchial wall thickening. No acute consolidative airspace disease or lung masses. No significant growth of previously visualized pulmonary nodules. No new significant pulmonary nodules.   Upper abdomen: Diffuse coarse calcification throughout the wall of the gallbladder, compatible with porcelain gallbladder, unchanged. Diffuse hepatic steatosis. Severe asymmetric left renal atrophy is unchanged. Multiple simple exophytic lateral left renal cysts, largest 3.0 cm, for which no follow-up imaging is recommended.   Musculoskeletal: No aggressive appearing focal osseous lesions. Moderate thoracic spondylosis.   IMPRESSION: 1. Lung-RADS 2, benign appearance or behavior. Continue annual screening with low-dose chest CT without contrast in 12 months. 2. Chronic porcelain gallbladder. General surgical consultation may be considered. 3. Three-vessel coronary atherosclerosis. 4. Diffuse hepatic steatosis. 5. Chronic severe asymmetric left renal atrophy. 6. Aortic Atherosclerosis (ICD10-I70.0) and Emphysema (ICD10-J43.9).     Electronically Signed   By: Ilona Sorrel M.D.   On: 06/18/2022 15:29 Within last 24 hrs: No results found.   Assessment  Assessment     Patient Active Problem List    Diagnosis Date Noted   Genetic testing 07/16/2022   Left renal atrophy 06/19/2022   Porcelain gallbladder 12/29/2021   Other proteinuria 11/17/2021   Invasive carcinoma of breast ([place]) 11/14/2021   Goals of care, counseling/discussion 11/14/2021   Aortic atherosclerosis ([place]) 05/23/2020   Hepatic steatosis 05/23/2020   Genetic disorder 05/23/2020   BMI 50.0-59.9, adult ([place]) 04/03/2020   History of smoking 04/03/2020   Morbid obesity ([place]) 12/02/2018   Essential hypertension 12/02/2018   Stage 3a  chronic kidney disease ([place]) 12/01/2018   Vitamin D deficiency 05/23/2018   Depression     Allergic rhinitis     Centrilobular emphysema ([place])     Hyperlipidemia     Sleep apnea        Plan     Plan Robotic cholecystectomy with ICG   This was discussed thoroughly.  Optimal plan is for robotic cholecystectomy utilizing ICG imaging. Risks and benefits have been discussed with the patient which include but are not limited to anesthesia, bleeding, infection, biliary ductal injury, resulting in leak or stenosis, other associated unanticipated injuries affiliated with laparoscopic surgery.   Reviewed that removing the gallbladder will only address the symptoms related to the gallbladder itself.  I believe there is the desire to proceed, accepting the risks with understanding.  Questions elicited and answered to satisfaction.    No guarantees ever expressed or implied.

## 2022-08-26 NOTE — Discharge Instructions (Addendum)

## 2022-08-26 NOTE — Anesthesia Postprocedure Evaluation (Signed)
Anesthesia Post Note  Patient: Dorothy Cunningham  Procedure(s) Performed: XI ROBOTIC ASSISTED LAPAROSCOPIC CHOLECYSTECTOMY INDOCYANINE GREEN FLUORESCENCE IMAGING (ICG)  Patient location during evaluation: PACU Anesthesia Type: General Level of consciousness: awake and alert Pain management: pain level controlled Vital Signs Assessment: post-procedure vital signs reviewed and stable Respiratory status: spontaneous breathing, nonlabored ventilation, respiratory function stable and patient connected to nasal cannula oxygen Cardiovascular status: blood pressure returned to baseline and stable Postop Assessment: no apparent nausea or vomiting Anesthetic complications: no   No notable events documented.   Last Vitals:  Vitals:   08/26/22 1040 08/26/22 1045  BP:  (!) 176/87  Pulse: 85 78  Resp: 14 14  Temp:    SpO2: 100% 100%    Last Pain:  Vitals:   08/26/22 0636  TempSrc: Temporal  PainSc: 0-No pain                 Ilene Qua

## 2022-08-26 NOTE — Transfer of Care (Signed)
Immediate Anesthesia Transfer of Care Note  Patient: Dorothy Cunningham  Procedure(s) Performed: XI ROBOTIC ASSISTED LAPAROSCOPIC CHOLECYSTECTOMY INDOCYANINE GREEN FLUORESCENCE IMAGING (ICG)  Patient Location: PACU  Anesthesia Type:General  Level of Consciousness: awake, alert , and drowsy  Airway & Oxygen Therapy: Patient Spontanous Breathing  Post-op Assessment: Report given to RN, Post -op Vital signs reviewed and stable, and Patient moving all extremities X 4  Post vital signs: Reviewed and stable  Last Vitals:  Vitals Value Taken Time  BP 158/87 08/26/22 1018  Temp    Pulse 85 08/26/22 1020  Resp 28 08/26/22 1020  SpO2 95 % 08/26/22 1020  Vitals shown include unvalidated device data.  Last Pain:  Vitals:   08/26/22 0636  TempSrc: Temporal  PainSc: 0-No pain         Complications: No notable events documented.

## 2022-08-26 NOTE — Op Note (Signed)
Robotic cholecystectomy with Indocyamine Green Ductal Imaging.   Pre-operative Diagnosis: Porcelain gallbladder/chronic calculus cholecystitis  Post-operative Diagnosis:  Same.  Procedure: Robotic assisted laparoscopic cholecystectomy with Indocyamine Green Ductal Imaging.   Surgeon: Ronny Bacon, M.D., FACS  Anesthesia: General. with endotracheal tube  Findings: As expected calcified gallbladder, with residual suppleness of the gallbladder neck and cystic duct.  Difficult to manipulate and retract.  Excellent ICG imaging of the cystic duct, distinguishing it from the common hepatic duct.    Estimated Blood Loss: 25 mL         Drains: None         Specimens: Gallbladder           Complications: none  Procedure Details  The patient was seen again in the Holding Room.  1.25 mg dose of ICG was administered intravenously.   The benefits, complications, treatment options, risks and expected outcomes were again reviewed with the patient. The likelihood of improving the patient's symptoms with return to their baseline status is good.  The patient and/or family concurred with the proposed plan, giving informed consent, again alternatives reviewed.  The patient was taken to Operating Room, identified, and the procedure verified as robotic assisted laparoscopic cholecystectomy.  Prior to the induction of general anesthesia, antibiotic prophylaxis was administered. VTE prophylaxis was in place. General endotracheal anesthesia was then administered and tolerated well. The patient was positioned in the supine position.  After the induction, the abdomen was prepped with Chloraprep and draped in the sterile fashion.  A Time Out was held and the above information confirmed.  After local infiltration of quarter percent Marcaine with epinephrine, stab incision was made left upper quadrant.  Just below the costal margin at Palmer's point, approximately midclavicular line the Veres needle is passed with  sensation of the layers to penetrate the abdominal wall and into the peritoneum.  Saline drop test is confirmed peritoneal placement.  Insufflation is initiated with carbon dioxide to pressures of 15 mmHg.  Right para-umbilical local infiltration with quarter percent Marcaine with epinephrine is utilized.  Made a 12 mm incision on the right periumbilical site, I advanced an optical 70m port under direct visualization into the peritoneal cavity.  Once the peritoneum was penetrated, insufflation was initiated.  The trocar was then advanced into the abdominal cavity under direct visualization. Pneumoperitoneum was then continued utilizing CO2 at 15 mmHg or less and tolerated well without any adverse changes in the patient's vital signs.  Two 8.5-mm ports were placed in the left lower quadrant and laterally, and one to the right lower quadrant, all under direct vision. All skin incisions  were infiltrated with a local anesthetic agent before making the incision and placing the trocars.  The patient was positioned  in reverse Trendelenburg, tilted the patient's left side down.  Da Vinci XI robot was then positioned on to the patient's left side, and docked.  The gallbladder was identified, there were extensive adhesions to the dorsal aspect of the gallbladder.  I was unable to grasp the fundus, but with manipulation with the ProGrasp, was able to gradually work my way down the dorsal aspect mobilizing the fatty adhesions.  I was able to identify the duodenum quickly and ensure that we were staying well away from it during this dissection.  Subsequently able to find less adhesive disease on each medial and lateral aspect and we worked our way centrally toward the supple gallbladder neck.  ICG was instrumental in identifying the cystic duct, and following the cystic  duct up to the gallbladder neck to confirm.  The common hepatic duct was also readily visualized with the firefly, and provided great deal of  orientation.  Adhesions were lysed with scissors and cautery.  The cystic duct and gallbladder neck was identified grasped from this perspective the cystic artery was readily visualized, it was divided with bipolar and multiple locations and divided getting it out of the way and enabling better access to isolating the cystic duct..   The cystic duct was clearly identified and dissected to isolation.  The cystic duct was triple clipped and divided with scissors, as close to the gallbladder neck as feasible, thus leaving two on the remaining stump.    The gallbladder was taken from the gallbladder fossa in a retrograde fashion with the electrocautery.  Essentially chiseling it away from the adjacent hepatic parenchyma maintaining excellent hemostasis with the monopolar scissors.  The gallbladder was removed and placed in an Endocatch bag.  The liver bed is inspected. Hemostasis was confirmed.  The robot was undocked and moved away from the operative field. Multiple liters of normal saline irrigation was utilized and was aspirated clear.  The gallbladder and Endocatch sac were then removed through the infraumbilical port site, I extended this incision significantly to accommodate this large calcified gallbladder.  The fascia was then reapproximated with a running PDS.  I then placed a drain through the right sided port site and placed it in the gallbladder fossa, and secured at the skin with 3-0 nylon. Inspection of the right upper quadrant was performed. No bleeding, bile duct injury or leak, or bowel injury was noted. Pneumoperitoneum was released and ports removed.  4-0 subcuticular Monocryl was used to close the skin. Dermabond was  applied.  The patient was then extubated and brought to the recovery room in stable condition. Sponge, lap, and needle counts were correct at closure and at the conclusion of the case.               Ronny Bacon, M.D., [facility] 08/26/2022 10:30 AM

## 2022-08-28 LAB — SURGICAL PATHOLOGY

## 2022-09-03 ENCOUNTER — Encounter: Payer: Self-pay | Admitting: Physician Assistant

## 2022-09-03 ENCOUNTER — Other Ambulatory Visit: Payer: Self-pay

## 2022-09-03 ENCOUNTER — Ambulatory Visit (INDEPENDENT_AMBULATORY_CARE_PROVIDER_SITE_OTHER): Payer: Federal, State, Local not specified - PPO | Admitting: Physician Assistant

## 2022-09-03 VITALS — BP 157/86 | HR 89 | Temp 98.2°F | Ht 60.75 in | Wt 256.0 lb

## 2022-09-03 DIAGNOSIS — Z09 Encounter for follow-up examination after completed treatment for conditions other than malignant neoplasm: Secondary | ICD-10-CM

## 2022-09-03 DIAGNOSIS — K828 Other specified diseases of gallbladder: Secondary | ICD-10-CM

## 2022-09-03 NOTE — Patient Instructions (Signed)

## 2022-09-03 NOTE — Progress Notes (Signed)
[facility] POST-OP OFFICE VISIT  09/03/2022  HPI: Dorothy Cunningham is a 61 y.o. female 8 days s/p robotic assisted laparoscopic cholecystectomy for [facility]  She is doing great Abdominal soreness mainly at drain Only needed 1 narcotic pain medications; now using ibuprofen No fever, chills, nausea, emesis Tolerating PO; reports bowels are more regular now No issues with incisions; some bruising Drain with 10-20 ccs daily or serous fluid   Vital signs: BP (!) 157/86   Pulse 89   Temp 98.2 F (36.8 C) (Oral)   Ht 5' 0.75" (1.543 m)   Wt 256 lb (116.1 kg)   LMP  (LMP Unknown)   SpO2 97%   BMI 48.77 kg/m    Physical Exam: Constitutional: Well appearing female, NAD Abdomen: Soft, non-tender, non-distended, no rebound/guarding. Drain in right lateral port; output serous (removed) Skin: Laparoscopic incisions are healing well, no erythema or drainage; there is healing ecchymosis at umbilical incision   Assessment/Plan: This is a 61 y.o. female 8 days s/p robotic assisted laparoscopic cholecystectomy for [facility]   - Pain control prn  - Drain removed; dressing placed  - Reviewed wound care recommendation  - Reviewed lifting restrictions; 4 weeks total  - Reviewed surgical pathology; Cholecystitis, negative for malignancy   - She can follow up on as needed basis; She understands to call with questions/concerns  -- Edison Simon, PA-C [facility] 09/03/2022, 2:32 PM M-F: 7am - 4pm

## 2022-09-17 ENCOUNTER — Other Ambulatory Visit: Payer: Self-pay | Admitting: Nurse Practitioner

## 2022-09-18 NOTE — Telephone Encounter (Signed)
Requested Prescriptions  Pending Prescriptions Disp Refills   ANORO ELLIPTA 62.5-25 MCG/ACT AEPB [Pharmacy Med Name: ANORO ELLIPTA 62.5-25 ORAL INH(30S)] 60 each 2    Sig: INHALE 1 PUFF INTO THE LUNGS DAILY AT 6 AM     Pulmonology:  [employer] Passed - 09/17/2022  7:42 PM      Passed - Valid encounter within last 12 months    Recent Outpatient Visits           1 month ago Stage 3a chronic kidney disease ([place])   [place] Cannady, Jolene T, NP   2 months ago Stage 3a chronic kidney disease ([place])   [place] Cannady, Jolene T, NP   3 months ago Centrilobular emphysema ([place])   [place] Cannady, Jolene T, NP   8 months ago Invasive carcinoma of breast ([place])   [place] Cannady, Jolene T, NP   10 months ago Invasive carcinoma of breast ([place])   [place], Barbaraann Faster, NP       Future Appointments             In 3 weeks Cannady, Barbaraann Faster, NP [employer]

## 2022-10-11 NOTE — Patient Instructions (Signed)
Living with COPD Being diagnosed with chronic obstructive pulmonary disease (COPD) changes your life physically and emotionally. Having COPD can affect your ability to work and do things you enjoy. COPD is not the same for everyone, and it may change over time. Your health care providers can help you come up with the COPD management plan that works best for you. How to manage lifestyle changes Treatment plan Work closely with your health care providers. Follow your COPD management plan. This plan includes: Instructions about activities, exercises, diet, medicines, what to do when COPD flares up, and when to call your health care provider. A pulmonary rehabilitation program. In pulmonary rehab, you will learn about COPD, do exercises for fitness and breathing, and get support from health care providers and other people who have COPD. Managing emotions and stress Living with a chronic disease means you may also struggle with stressful emotions, such as sadness, fear, and worry. Here are some ways to manage these emotions: Talk to someone about your fear, anxiety, depression, or stress. Learn strategies to avoid or reduce stress and ask for help if you are struggling with depression or anxiety. Consider joining a COPD support group, online or in person.  Adjusting to changes COPD may limit the things you can do, but you can make certain changes to help you cope with the diagnosis. Ask for help when you need it. Getting support from friends, family, and your health care team is an important part of managing the condition. Try to get regular exercise as prescribed by a health care provider or pulmonary rehab team. Exercising can help COPD, even if you are a bit short of breath. Take steps to prevent infection and protect your lungs: Wash your hands often and avoid being in crowds. Stay away from friends and family members who are sick. Check your local air quality each day, and stay out of areas  where air pollution is likely. How to recognize changes in your condition Recognizing changes in your COPD COPD is a progressive disease. It is important to let the health care team know if your COPD is getting worse. Your treatment plan may need to change. Watch for: Increased shortness of breath, wheezing, cough, or fatigue. Loss of ability to exercise or perform daily activities, like climbing stairs. More frequent symptom flares. Signs of depression or anxiety. Recognizing stress It is normal to have additional stress when you have COPD. However, prolonged stress and anxiety can make COPD worse and lead to depression. Recognize the warning signs, which include: Feeling sad or worried more often or most of the time. Having less energy and losing interest in pleasurable activities. Changes in your appetite or sleeping patterns. Being easily angered or irritated. Having unexplained aches and pains, digestive problems, or headaches. Follow these instructions at home: Eating and drinking  Eat foods that are high in fiber, such as fresh fruits and vegetables, whole grains, and beans. Limit foods that are high in fat and processed sugars, such as fried or sweet foods. Follow a balanced diet and maintain a healthy weight. Being overweight or underweight can make COPD worse. You may work with a dietitian as part of your pulmonary rehab program. Drink enough fluid to keep your urine pale yellow. If you drink alcohol: Limit how much you have to: 0-1 drink a day for women who are not pregnant. 0-2 drinks a day for men. Know how much alcohol is in your drink. In the [place] one drink equals one 12 oz bottle   of beer (355 mL), one 5 oz glass of wine (148 mL), or one 1 oz glass of hard liquor (44 mL). Lifestyle If you smoke, the most important thing that you can do is to stop smoking. Continuing to smoke will cause the disease to progress faster. Do not use any products that contain nicotine or  tobacco. These products include cigarettes, chewing tobacco, and vaping devices, such as e-cigarettes. If you need help quitting, ask your health care provider. Avoid exposure to things that irritate your lungs, such as smoke, chemicals, and fumes. Activity Balance exercise and rest. Take short walks every 1-2 hours. This is important to improve blood flow and breathing. Ask for help if you feel weak or unsteady. Do exercises that include controlled breathing with body movement, such as tai chi. General instructions Take over-the-counter and prescription medicines only as told by your health care provider. Take vitamin and protein supplements as told by your health care provider or dietitian. Practice good oral hygiene and see your dental care provider regularly. An oral infection can also spread to your lungs. Make sure you receive all the vaccines that your health care provider recommends. Keep all follow-up visits. This is important. Contact a health care provider if you: Are struggling to manage your COPD. Have emotional stress that interferes with your ability to cope with COPD. Get help right away if you: Have thoughts of suicide, death, or hurting yourself or others. If you ever feel like you may hurt yourself or others, or have thoughts about taking your own life, get help right away. Go to your nearest emergency department or: Call your local emergency services (911 in the [place] Call a suicide crisis helpline, such as the National Suicide Prevention Lifeline at 1-800-273-8255 or 988 in the [place] This is open 24 hours a day in the [place] Text the Crisis Text Line at 741741 (in the [place] Summary Being diagnosed with chronic obstructive pulmonary disease (COPD) changes your life physically and emotionally. Work with your health care providers and follow your COPD management plan. A pulmonary rehabilitation program is an important part of COPD management. Prolonged stress, anxiety, and  depression can make COPD worse. Let your health care provider know if emotional stress interferes with your ability to cope with and manage COPD. This information is not intended to replace advice given to you by your health care provider. Make sure you discuss any questions you have with your health care provider. Document Revised: 04/09/2021 Document Reviewed: 10/02/2020 [employer] Patient Education  2023 [employer]  

## 2022-10-14 ENCOUNTER — Encounter: Payer: Self-pay | Admitting: Nurse Practitioner

## 2022-10-14 ENCOUNTER — Ambulatory Visit (INDEPENDENT_AMBULATORY_CARE_PROVIDER_SITE_OTHER): Payer: Federal, State, Local not specified - PPO | Admitting: Nurse Practitioner

## 2022-10-14 VITALS — BP 122/77 | HR 83 | Temp 97.9°F | Ht 60.75 in | Wt 256.2 lb

## 2022-10-14 DIAGNOSIS — Z0000 Encounter for general adult medical examination without abnormal findings: Secondary | ICD-10-CM | POA: Diagnosis not present

## 2022-10-14 DIAGNOSIS — I10 Essential (primary) hypertension: Secondary | ICD-10-CM

## 2022-10-14 DIAGNOSIS — I700 Atherosclerosis of aorta: Secondary | ICD-10-CM | POA: Diagnosis not present

## 2022-10-14 DIAGNOSIS — F321 Major depressive disorder, single episode, moderate: Secondary | ICD-10-CM

## 2022-10-14 DIAGNOSIS — Z87891 Personal history of nicotine dependence: Secondary | ICD-10-CM

## 2022-10-14 DIAGNOSIS — K760 Fatty (change of) liver, not elsewhere classified: Secondary | ICD-10-CM

## 2022-10-14 DIAGNOSIS — Z6841 Body Mass Index (BMI) 40.0 and over, adult: Secondary | ICD-10-CM

## 2022-10-14 DIAGNOSIS — C50919 Malignant neoplasm of unspecified site of unspecified female breast: Secondary | ICD-10-CM

## 2022-10-14 DIAGNOSIS — R808 Other proteinuria: Secondary | ICD-10-CM

## 2022-10-14 DIAGNOSIS — E559 Vitamin D deficiency, unspecified: Secondary | ICD-10-CM

## 2022-10-14 DIAGNOSIS — J432 Centrilobular emphysema: Secondary | ICD-10-CM

## 2022-10-14 DIAGNOSIS — E782 Mixed hyperlipidemia: Secondary | ICD-10-CM

## 2022-10-14 DIAGNOSIS — Z79811 Long term (current) use of aromatase inhibitors: Secondary | ICD-10-CM

## 2022-10-14 DIAGNOSIS — N261 Atrophy of kidney (terminal): Secondary | ICD-10-CM

## 2022-10-14 DIAGNOSIS — N1831 Chronic kidney disease, stage 3a: Secondary | ICD-10-CM

## 2022-10-14 DIAGNOSIS — G4733 Obstructive sleep apnea (adult) (pediatric): Secondary | ICD-10-CM

## 2022-10-14 DIAGNOSIS — Z23 Encounter for immunization: Secondary | ICD-10-CM

## 2022-10-14 MED ORDER — ANORO ELLIPTA 62.5-25 MCG/ACT IN AEPB: INHALATION_SPRAY | RESPIRATORY_TRACT | 5 refills | Status: DC

## 2022-10-14 MED ORDER — ATORVASTATIN CALCIUM 20 MG PO TABS: ORAL_TABLET | ORAL | 4 refills | Status: DC

## 2022-10-14 MED ORDER — BUPROPION HCL ER (XL) 300 MG PO TB24: ORAL_TABLET | ORAL | 4 refills | Status: DC

## 2022-10-14 NOTE — Assessment & Plan Note (Signed)
Chronic, stable.  Continue current medication regimen and adjust as needed.  Lipid panel today. 

## 2022-10-14 NOTE — Progress Notes (Signed)
BP 122/77   Pulse 83   Temp 97.9 F (36.6 C) (Oral)   Ht 5' 0.75" (1.543 m)   Wt 256 lb 3.2 oz (116.2 kg)   LMP  (LMP Unknown)   SpO2 96%   BMI 48.81 kg/m    Subjective:    Patient ID: Dorothy Cunningham, female    DOB: [birth year], 62 y.o.   MRN: [id number]  HPI: Dorothy Cunningham is a 62 y.o. female presenting on 10/14/2022 for comprehensive medical examination. Current medical complaints include:none  She currently lives with: husband Menopausal Symptoms: no  HYPERTENSION / HYPERLIPIDEMIA Taking Losartan, Amlodipine, and Atorvastatin. Diagnosed with OSA several years ago does not use CPAP, does head of bed elevation. Satisfied with current treatment? yes Duration of hypertension: chronic BP monitoring frequency: not checking BP range:  BP medication side effects: no Duration of hyperlipidemia: chronic Cholesterol medication side effects: no Cholesterol supplements: fish oil Medication compliance: good compliance Aspirin: yes Recent stressors: no Recurrent headaches: no Visual changes: no Palpitations: no Dyspnea: no Chest pain: no Lower extremity edema: no Dizzy/lightheaded: no    BREAST CANCER: Diagnosed on 11/14/21 with invasive carcinoma left breast, no lymph node involvement noted.  Seen by oncology last 08/19/22, she finished radiation treatment on 04/14/22.  Continues on Arimidex daily.  Scheduled for mammogram upcoming and has had a DEXA scan.     COPD Continues Anoro daily and Albuterol as needed -- no recent Albuterol in use in a long while.  Quit smoking >10 years ago, smoked cigarettes for 30 years -- started at age 73 -- smoked about 1 PPD.  Has known emphysema and aortic atherosclerosis on imaging.   Last lung screening 06/17/22 noted benign appearance with exception of some hepatic steatosis and severe asymmetric left renal atrophy.  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 COPD status: stable Satisfied  with current treatment?: yes Oxygen use: no Dyspnea frequency: no Cough frequency: improved, minimal Rescue inhaler frequency: none in awhile Limitation of activity: no Productive cough: no Last Spirometry:  11/17/21 FEV1 62% and FEV1/FVC 81%, previous 88% and 112% Pneumovax: Up To Date Influenza: Up to Date   CHRONIC KIDNEY DISEASE With proteinuria at 150 - taking Losartan 50 MG daily and tolerating well.  Is going to reschedule with nephrology upcoming.   CKD status: stable Medications renally dose: yes Previous renal evaluation: no Pneumovax:  Up to Date Influenza Vaccine:  Up to Date    DEPRESSION Continues on Wellbutrin for mood. Mood status: stable Satisfied with current treatment?: yes Symptom severity: mild  Duration of current treatment : chronic Side effects: no Medication compliance: good compliance Psychotherapy/counseling: none Depressed mood: no Anxious mood: no Anhedonia: no Significant weight loss or gain: no Insomnia: none Fatigue: no Feelings of worthlessness or guilt: no Impaired concentration/indecisiveness: no Suicidal ideations: no Hopelessness: no Crying spells: no    10/14/2022    1:26 PM 08/14/2022   10:51 AM 07/07/2022    2:37 PM 05/26/2022   10:31 AM 12/29/2021    3:29 PM  Depression screen PHQ 2/9  Decreased Interest 0 0 0 0 0  Down, Depressed, Hopeless 0 0 0 0 0  PHQ - 2 Score 0 0 0 0 0  Altered sleeping 0 0 0 0 0  Tired, decreased energy 0 1 0 0 1  Change in appetite 0 0 0 0 1  Feeling bad or failure about yourself  0 0 0 0 0  Trouble  concentrating 0 0 0 0 0  Moving slowly or fidgety/restless 0 0 0 0 0  Suicidal thoughts 0 0 0 0 0  PHQ-9 Score 0 1 0 0 2  Difficult doing work/chores Not difficult at all Not difficult at all Not difficult at all Not difficult at all       10/14/2022    1:26 PM 08/14/2022   10:51 AM 07/07/2022    2:37 PM 05/26/2022   10:31 AM  GAD 7 : Generalized Anxiety Score  Nervous, Anxious, on Edge 0 0 1 0   Control/stop worrying 0 0 0 0  Worry too much - different things 0 0 1 0  Trouble relaxing 0 0 0 0  Restless 0 0 0 0  Easily annoyed or irritable 0 0 1 0  Afraid - awful might happen 0 0 1 0  Total GAD 7 Score 0 0 4 0  Anxiety Difficulty Not difficult at all Not difficult at all Somewhat difficult Not difficult at all      07/07/2022    2:36 PM 07/14/2022    3:36 PM 08/14/2022   10:51 AM 08/26/2022    6:37 AM 09/03/2022    2:15 PM  Fall Risk  Falls in the past year? 0 0 0  0  Was there an injury with Fall? 0  0    Fall Risk Category Calculator 0  0    Fall Risk Category (Retired) Low  Low    (RETIRED) Patient Fall Risk Level Low fall risk   Moderate fall risk   Patient at Risk for Falls Due to No Fall Risks  No Fall Risks    Fall risk Follow up Falls evaluation completed  Falls evaluation completed      Functional Status Survey: Is the patient deaf or have difficulty hearing?: No Does the patient have difficulty seeing, even when wearing glasses/contacts?: No Does the patient have difficulty concentrating, remembering, or making decisions?: No Does the patient have difficulty walking or climbing stairs?: No Does the patient have difficulty dressing or bathing?: No Does the patient have difficulty doing errands alone such as visiting a doctor's office or shopping?: No   Past Medical History:  Past Medical History:  Diagnosis Date   Allergic rhinitis    Anxiety    Breast cancer, left breast ([place]) 10/2021   Chronic kidney disease, stage 3a ([place])    COPD (chronic obstructive pulmonary disease) ([place])    History of epilepsy    as a teenager   Hyperlipidemia    Hypertension    Morbid obesity with BMI of 45.0-49.9, adult ([place])    Sleep apnea     Surgical History:  Past Surgical History:  Procedure Laterality Date   Bladder Tuck  1995   BREAST BIOPSY Left 2014   benign   BREAST BIOPSY Left 11/06/2021   u/s bx  5 o'clock, VENUS clip- INVASIVE MAMMARY CARCINOMA,    BREAST LUMPECTOMY WITH SENTINEL LYMPH NODE BIOPSY Left 01/26/2022   Procedure: BREAST LUMPECTOMY WITH SENTINEL LYMPH NODE BX;  Surgeon: Robert Bellow, MD;  Location: [facility] ORS;  Service: General;  Laterality: Left;   COLONOSCOPY     PARTIAL HYSTERECTOMY  1995   due to heavy periods    Medications:  Current Outpatient Medications on File Prior to Visit  Medication Sig   albuterol (VENTOLIN HFA) 108 (90 Base) MCG/ACT inhaler INHALE 2 PUFFS INTO THE LUNGS EVERY 6 HOURS AS NEEDED FOR WHEEZING OR SHORTNESS OF BREATH  amLODipine (NORVASC) 2.5 MG tablet Take 1 tablet (2.5 mg total) by mouth daily.   anastrozole (ARIMIDEX) 1 MG tablet TAKE 1 TABLET(1 MG) BY MOUTH DAILY   aspirin 81 MG EC tablet Take 81 mg by mouth daily.   b complex vitamins capsule Take 1 capsule by mouth daily.   Calcium-Magnesium-Vitamin D (CALCIUM 1200+D3 PO)    Cholecalciferol (VITAMIN D) 50 MCG (2000 UT) CAPS Take 4,000 Units by mouth daily.   diclofenac Sodium (VOLTAREN) 1 % GEL APPLY 2 GRAMS TOPICALLY TO THE AFFECTED AREA FOUR TIMES DAILY (Patient taking differently: Apply 2 g topically 4 (four) times daily as needed.)   diphenhydrAMINE (BENADRYL) 25 MG tablet Take 25 mg by mouth daily.   fluticasone (FLONASE) 50 MCG/ACT nasal spray SHAKE LIQUID AND USE 2 SPRAYS IN EACH NOSTRIL DAILY   Garlic (GARLIQUE PO) Take 1 tablet by mouth daily.   ibuprofen (ADVIL) 800 MG tablet Take 1 tablet (800 mg total) by mouth every 8 (eight) hours as needed.   losartan (COZAAR) 100 MG tablet Take 1 tablet (100 mg total) by mouth daily.   Multiple Vitamin (MULTIVITAMIN) tablet Take 1 tablet by mouth daily.   nystatin ointment (MYCOSTATIN) APPLY TOPICALLY TO THE AFFECTED AREA THREE TIMES DAILY (Patient taking differently: Apply 1 application  topically daily as needed (rash).)   Omega-3 Fatty Acids (FISH OIL PO) Take 1 capsule by mouth daily.   No current facility-administered medications on file prior to visit.    Allergies:   Allergies  Allergen Reactions   Penicillins Hives    Social History:  Social History   Socioeconomic History   Marital status: Married    Spouse name: Charles   Number of children: 4   Years of education: Not on file   Highest education level: Not on file  Occupational History   Not on file  Tobacco Use   Smoking status: Former    Packs/day: 0.75    Years: 40.00    Total pack years: 30.00    Types: Cigarettes    Quit date: 09/28/2014    Years since quitting: 8.0   Smokeless tobacco: Never  Vaping Use   Vaping Use: Former  Substance and Sexual Activity   Alcohol use: No   Drug use: No   Sexual activity: Yes  Other Topics Concern   Not on file  Social History Narrative   Not on file   Social Determinants of Health   Financial Resource Strain: Low Risk  (10/14/2022)   Overall Financial Resource Strain (CARDIA)    Difficulty of Paying Living Expenses: Not hard at all  Food Insecurity: No Food Insecurity (10/14/2022)   Hunger Vital Sign    Worried About Running Out of Food in the Last Year: Never true    [place] in the Last Year: Never true  Transportation Needs: No Transportation Needs (10/14/2022)   PRAPARE - Hydrologist (Medical): No    Lack of Transportation (Non-Medical): No  Physical Activity: Insufficiently Active (10/14/2022)   Exercise Vital Sign    Days of Exercise per Week: 2 days    Minutes of Exercise per Session: 30 min  Stress: No Stress Concern Present (10/14/2022)   [place]    Feeling of Stress : Not at all  Social Connections: [place] (10/14/2022)   Social Connection and Isolation Panel [NHANES]    Frequency of Communication with Friends and Family: More than three  times a week    Frequency of Social Gatherings with Friends and Family: More than three times a week    Attends Religious Services: More than 4 times per year    Active  Member of [employer] or Organizations: Yes    Attends Archivist Meetings: Never    Marital Status: Married  Human resources officer Violence: Not At Risk (10/14/2022)   Humiliation, Afraid, Rape, and Kick questionnaire    Fear of Current or Ex-Partner: No    Emotionally Abused: No    Physically Abused: No    Sexually Abused: No   Social History   Tobacco Use  Smoking Status Former   Packs/day: 0.75   Years: 40.00   Total pack years: 30.00   Types: Cigarettes   Quit date: 09/28/2014   Years since quitting: 8.0  Smokeless Tobacco Never   Social History   Substance and Sexual Activity  Alcohol Use No    Family History:  Family History  Problem Relation Age of Onset   Thyroid disease Mother    Asthma Mother    Osteoarthritis Father    CAD Father    Diabetes Maternal Uncle    Diabetes Maternal Grandfather    Tuberculosis Maternal Grandfather    Diabetes Paternal Grandmother    Heart attack Paternal Grandfather    Breast cancer Other        great grandmother    Past medical history, surgical history, medications, allergies, family history and social history reviewed with patient today and changes made to appropriate areas of the chart.   ROS All other ROS negative except what is listed above and in the HPI.      Objective:    BP 122/77   Pulse 83   Temp 97.9 F (36.6 C) (Oral)   Ht 5' 0.75" (1.543 m)   Wt 256 lb 3.2 oz (116.2 kg)   LMP  (LMP Unknown)   SpO2 96%   BMI 48.81 kg/m   Wt Readings from Last 3 Encounters:  10/14/22 256 lb 3.2 oz (116.2 kg)  09/03/22 256 lb (116.1 kg)  08/26/22 257 lb 8 oz (116.8 kg)    Physical Exam Vitals and nursing note reviewed. Exam conducted with a chaperone present.  Constitutional:      General: She is awake. She is not in acute distress.    Appearance: She is well-developed and well-groomed. She is obese. She is not ill-appearing or toxic-appearing.  HENT:     Head: Normocephalic and atraumatic.     Right Ear:  Hearing, tympanic membrane, ear canal and external ear normal. No drainage.     Left Ear: Hearing, tympanic membrane, ear canal and external ear normal. No drainage.     Nose: Nose normal.     Right Sinus: No maxillary sinus tenderness or frontal sinus tenderness.     Left Sinus: No maxillary sinus tenderness or frontal sinus tenderness.     Mouth/Throat:     Mouth: Mucous membranes are moist.     Pharynx: Oropharynx is clear. Uvula midline. No pharyngeal swelling, oropharyngeal exudate or posterior oropharyngeal erythema.  Eyes:     General: Lids are normal.        Right eye: No discharge.        Left eye: No discharge.     Extraocular Movements: Extraocular movements intact.     Conjunctiva/sclera: Conjunctivae normal.     Pupils: Pupils are equal, round, and reactive to light.     Visual Fields: Right  eye visual fields normal and left eye visual fields normal.  Neck:     Thyroid: No thyromegaly.     Vascular: No carotid bruit.     Trachea: Trachea normal.  Cardiovascular:     Rate and Rhythm: Normal rate and regular rhythm.     Heart sounds: Normal heart sounds. No murmur heard.    No gallop.  Pulmonary:     Effort: Pulmonary effort is normal. No accessory muscle usage or respiratory distress.     Breath sounds: Normal breath sounds.  Chest:     Comments: Deferred today, upcoming mammogram. Abdominal:     General: Bowel sounds are normal.     Palpations: Abdomen is soft. There is no hepatomegaly or splenomegaly.     Tenderness: There is no abdominal tenderness.  Musculoskeletal:        General: Normal range of motion.     Cervical back: Normal range of motion and neck supple.     Right lower leg: No edema.     Left lower leg: No edema.  Lymphadenopathy:     Head:     Right side of head: No submental, submandibular, tonsillar, preauricular or posterior auricular adenopathy.     Left side of head: No submental, submandibular, tonsillar, preauricular or posterior auricular  adenopathy.     Cervical: No cervical adenopathy.  Skin:    General: Skin is warm and dry.     Capillary Refill: Capillary refill takes less than 2 seconds.     Findings: No rash.  Neurological:     Mental Status: She is alert and oriented to person, place, and time.     Gait: Gait is intact.     Deep Tendon Reflexes: Reflexes are normal and symmetric.     Reflex Scores:      Brachioradialis reflexes are 2+ on the right side and 2+ on the left side.      Patellar reflexes are 2+ on the right side and 2+ on the left side. Psychiatric:        Attention and Perception: Attention normal.        Mood and Affect: Mood normal.        Speech: Speech normal.        Behavior: Behavior normal. Behavior is cooperative.        Thought Content: Thought content normal.        Judgment: Judgment normal.    Results for orders placed or performed during the hospital encounter of 08/26/22  Surgical pathology  Result Value Ref Range   SURGICAL PATHOLOGY      SURGICAL PATHOLOGY CASE: [phone] [phone] PATIENT: Danford Bad Surgical Pathology Report     Specimen Submitted: A. Gallbladder  Clinical History: Porcelain gallbladder      DIAGNOSIS: A.  GALLBLADDER; CHOLECYSTECTOMY: - FIBROSING CHOLECYSTITIS WITH ABUNDANT CALCIFICATIONS (SEE COMMENT)  Comment: After the first block containing multiple (3) fragments of gallbladder tissue are examined additional sections are submitted. All show slides dense fibrosis of the gallbladder with associated calcifications.  Many of the fragments do not have an intact mucosal surface but focally there is intact mucosa seen in the first block and focally in the additional sections.  The mucosa does present does not show significant atypia.  There is no evidence of malignancy.  GROSS DESCRIPTION: A. Labeled: Gallbladder Received: Fresh Collection time: 9:45 AM on 08/26/2022 Placed into formalin time: 10:12 AM on 08/26/2022 Size of specimen: 8.0  x 4.5 x 4.0 cm Specimen inte grity: Previously  disrupted External surface: Pink to purple and cauterized with a roughened hepatic surface Wall thickness: Ranges from 0.3-1.0 cm Mucosa: The mucosa is tan to yellow, glistening with a pearly appearance Cystic duct: The cystic duct is 1.3 x 0.8 x 0.5 cm and patent.  No adjacent lymph nodes are identified. Bile present: Yes, yellow and thick Stones present: Yes, multiple yellow stones are present within the lumen, 3 x 3 x 1.5 cm in aggregate Other findings: The wall is diffusely calcified  Block summary: 1 -cystic duct margin, en face and inked black with representative wall 2-5 multiple additional representative sections of gallbladder wall  CM 08/26/2022  Final Diagnosis performed by Theodora Blow, MD.   Electronically signed 08/28/2022 3:53:13PM The electronic signature indicates that the named Attending Pathologist has evaluated the specimen Technical component performed at [facility], [address] [place], [place] [address] Lab: [phone] Dir: [place] Rod, MD, MMM  Professional component performed at [facility], [facility], [place], [place], [place] [address] Lab: [phone] Dir: Kathi Simpers, MD       Assessment & Plan:   Problem List Items Addressed This Visit       Cardiovascular and Mediastinum   Aortic atherosclerosis ([place])    Ongoing. Lung screening CT, along with 3 vessel coronary artery disease.  Educated patient on this and recommend continue statin daily +  ASA 81 MG daily for prevention.  Continue cessation of smoking.  Focus on healthy diet and regular exercise.      Relevant Medications   atorvastatin (LIPITOR) 20 MG tablet   Other Relevant Orders   Comprehensive metabolic panel   Lipid Panel w/o Chol/HDL Ratio   Essential hypertension    Chronic, stable.  BP much improved on exam today.  Educated on [place] and recommend checking BP at home daily and documenting for provider  visits. Continue Losartan daily, which is beneficial for proteinuria (80 on check 2023, recheck today) and continue 2.5 MG Amlodipine daily, educated her on these.  Avoid ACE due to underlying lung disease. Scheduled to see nephrology. LABS: CBC, CMP, TSH, urine ALB. Return in 6 months.      Relevant Medications   atorvastatin (LIPITOR) 20 MG tablet   Other Relevant Orders   CBC with Differential/Platelet   Comprehensive metabolic panel   TSH     Respiratory   Centrilobular emphysema (HCC)    Chronic, ongoing.  FEV1 62% and FEV1/FVC 81% in February 2023.  Continue Anoro + continue Albuterol as needed, discussed at length with patient.  Continue lung screening annually, as past smoker with 30 year smoking history.  Refills as needed.        Relevant Medications   umeclidinium-vilanterol (ANORO ELLIPTA) 62.5-25 MCG/ACT AEPB   Other Relevant Orders   CBC with Differential/Platelet   Sleep apnea    Does not use CPAP, not interested in repeat study at this time.  Plan on repeat study in future if worsening symptoms present.        Digestive   Hepatic steatosis    Noted on 04/19/20 lung screening CT.  Educated patient on this.  Focus on healthy diet and regular exercise. CMP today.      Relevant Orders   Comprehensive metabolic panel     Genitourinary   Stage 3a chronic kidney disease ([place]) (Chronic)    Ongoing with urine ALB 80, continue ARB for kidney protection.  Recheck CMP and urine ALB today.  Avoid ACE due to COPD.  Nephrology  scheduled upcoming.      Relevant Orders   Comprehensive metabolic panel   Urine Microalbumin w/creat. ratio   Left renal atrophy    Noted on lung CA screening 06/19/22, has underlying CKD 3a.  To see nephrology upcoming.        Other   Invasive carcinoma of breast ([place]) - Primary (Chronic)    Ongoing.  Continue collaboration with oncology at this time.  Recent notes reviewed.      Aromatase inhibitor use    Ongoing, continue collaboration with  oncology team.      Depression    Chronic, stable on Wellbutrin.  Continue current regimen and adjust as needed.  Denies SI/HI.  Refills as needed.      Relevant Medications   buPROPion (WELLBUTRIN XL) 300 MG 24 hr tablet   History of smoking    Continue annual lung CT screening and monitor.      Hyperlipidemia    Chronic, stable.  Continue current medication regimen and adjust as needed.  Lipid panel today.      Relevant Medications   atorvastatin (LIPITOR) 20 MG tablet   Other Relevant Orders   Comprehensive metabolic panel   Lipid Panel w/o Chol/HDL Ratio   Morbid obesity (HCC)    BMI 48.81.  Recommended eating smaller high protein, low fat meals more frequently and exercising 30 mins a day 5 times a week with a goal of 10-15lb weight loss in the next 3 months. Patient voiced their understanding and motivation to adhere to these recommendations.       Other proteinuria    Ongoing.  Noted past labs at 28 to 150 and Losartan on board.  Check CMP today. To see nephrology upcoming.      Relevant Orders   Comprehensive metabolic panel   Vitamin D deficiency    Chronic.  Continue daily supplement and adjust as needed.  Check Vit D level today.      Relevant Orders   VITAMIN D 25 Hydroxy (Vit-D Deficiency, Fractures)   Other Visit Diagnoses     Encounter for annual physical exam       Annual physical today with labs and health maintenance reviewed, discussed with patient.        Follow up plan: Return in about 6 months (around 04/14/2023) for HTN/HLD, COPD, MOOD, BREAST CA -- spirometry needed.   LABORATORY TESTING:  - Pap smear: not applicable  IMMUNIZATIONS:   - Tdap: Tetanus vaccination status reviewed: Refuses today. - Influenza: Up to date - Pneumovax: Up to date - Prevnar: Not applicable - COVID: Up to date - HPV: Not applicable - Shingrix vaccine: Up to date  SCREENING: -Mammogram: scheduled on 11/19/22 - Colonoscopy: Refused  - Bone Density: Up to  date  -Hearing Test: Not applicable  -Spirometry: Up To Date  PATIENT COUNSELING:   Advised to take 1 mg of folate supplement per day if capable of pregnancy.   Sexuality: Discussed sexually transmitted diseases, partner selection, use of condoms, avoidance of unintended pregnancy  and contraceptive alternatives.   Advised to avoid cigarette smoking.  I discussed with the patient that most people either abstain from alcohol or drink within safe limits (<=14/week and <=4 drinks/occasion for males, <=7/weeks and <= 3 drinks/occasion for females) and that the risk for alcohol disorders and other health effects rises proportionally with the number of drinks per week and how often a drinker exceeds daily limits.  Discussed cessation/primary prevention of drug use and availability of treatment for  abuse.   Diet: Encouraged to adjust caloric intake to maintain  or achieve ideal body weight, to reduce intake of dietary saturated fat and total fat, to limit sodium intake by avoiding high sodium foods and not adding table salt, and to maintain adequate dietary potassium and calcium preferably from fresh fruits, vegetables, and low-fat dairy products.    Stressed the importance of regular exercise  Injury prevention: Discussed safety belts, safety helmets, smoke detector, smoking near bedding or upholstery.   Dental health: Discussed importance of regular tooth brushing, flossing, and dental visits.    NEXT PREVENTATIVE PHYSICAL DUE IN 1 YEAR. Return in about 6 months (around 04/14/2023) for HTN/HLD, COPD, MOOD, BREAST CA -- spirometry needed.

## 2022-10-14 NOTE — Assessment & Plan Note (Signed)
Ongoing. Lung screening CT, along with 3 vessel coronary artery disease.  Educated patient on this and recommend continue statin daily +  ASA 81 MG daily for prevention.  Continue cessation of smoking.  Focus on healthy diet and regular exercise.

## 2022-10-14 NOTE — Assessment & Plan Note (Addendum)
Chronic, stable.  BP much improved on exam today.  Educated on [place] and recommend checking BP at home daily and documenting for provider visits. Continue Losartan daily, which is beneficial for proteinuria (80 on check 2023, recheck today) and continue 2.5 MG Amlodipine daily, educated her on these.  Avoid ACE due to underlying lung disease. Scheduled to see nephrology. LABS: CBC, CMP, TSH, urine ALB. Return in 6 months.

## 2022-10-14 NOTE — Assessment & Plan Note (Addendum)
Ongoing with urine ALB 80, continue ARB for kidney protection.  Recheck CMP and urine ALB today.  Avoid ACE due to COPD.  Nephrology scheduled upcoming.

## 2022-10-14 NOTE — Assessment & Plan Note (Signed)
Ongoing, continue collaboration with oncology team.

## 2022-10-14 NOTE — Assessment & Plan Note (Signed)
BMI 48.81.  Recommended eating smaller high protein, low fat meals more frequently and exercising 30 mins a day 5 times a week with a goal of 10-15lb weight loss in the next 3 months. Patient voiced their understanding and motivation to adhere to these recommendations.

## 2022-10-14 NOTE — Assessment & Plan Note (Signed)
Does not use CPAP, not interested in repeat study at this time.  Plan on repeat study in future if worsening symptoms present.

## 2022-10-14 NOTE — Assessment & Plan Note (Signed)
Noted on lung CA screening 06/19/22, has underlying CKD 3a.  To see nephrology upcoming.

## 2022-10-14 NOTE — Assessment & Plan Note (Signed)
Continue annual lung CT screening and monitor.

## 2022-10-14 NOTE — Assessment & Plan Note (Signed)
Ongoing.  Noted past labs at 51 to 150 and Losartan on board.  Check CMP today. To see nephrology upcoming.

## 2022-10-14 NOTE — Assessment & Plan Note (Signed)
Noted on 04/19/20 lung screening CT.  Educated patient on this.  Focus on healthy diet and regular exercise. CMP today.

## 2022-10-14 NOTE — Assessment & Plan Note (Addendum)
Chronic.  Continue daily supplement and adjust as needed.  Check Vit D level today.

## 2022-10-14 NOTE — Assessment & Plan Note (Signed)
Chronic, stable on Wellbutrin.  Continue current regimen and adjust as needed.  Denies SI/HI.  Refills as needed.

## 2022-10-14 NOTE — Assessment & Plan Note (Signed)
Chronic, ongoing.  FEV1 62% and FEV1/FVC 81% in February 2023.  Continue Anoro + continue Albuterol as needed, discussed at length with patient.  Continue lung screening annually, as past smoker with 30 year smoking history.  Refills as needed.

## 2022-10-14 NOTE — Assessment & Plan Note (Signed)
Ongoing.  Continue collaboration with oncology at this time.  Recent notes reviewed.

## 2022-10-15 ENCOUNTER — Other Ambulatory Visit: Payer: Self-pay | Admitting: Nurse Practitioner

## 2022-10-15 LAB — COMPREHENSIVE METABOLIC PANEL
ALT: 51 IU/L — ABNORMAL HIGH (ref 0–32)
AST: 32 IU/L (ref 0–40)
Albumin/Globulin Ratio: 1.8 (ref 1.2–2.2)
Albumin: 4.3 g/dL (ref 3.9–4.9)
Alkaline Phosphatase: 71 IU/L (ref 44–121)
BUN/Creatinine Ratio: 11 — ABNORMAL LOW (ref 12–28)
BUN: 13 mg/dL (ref 8–27)
Bilirubin Total: 0.4 mg/dL (ref 0.0–1.2)
CO2: 19 mmol/L — ABNORMAL LOW (ref 20–29)
Calcium: 9.5 mg/dL (ref 8.7–10.3)
Chloride: 103 mmol/L (ref 96–106)
Creatinine, Ser: 1.22 mg/dL — ABNORMAL HIGH (ref 0.57–1.00)
Globulin, Total: 2.4 g/dL (ref 1.5–4.5)
Glucose: 90 mg/dL (ref 70–99)
Potassium: 4.1 mmol/L (ref 3.5–5.2)
Sodium: 140 mmol/L (ref 134–144)
Total Protein: 6.7 g/dL (ref 6.0–8.5)
eGFR: 50 mL/min/{1.73_m2} — ABNORMAL LOW (ref >59)

## 2022-10-15 LAB — MICROALBUMIN / CREATININE URINE RATIO
Creatinine, Urine: 161.1 mg/dL
Microalb/Creat Ratio: 30 mg/g creat — ABNORMAL HIGH (ref 0–29)
Microalbumin, Urine: 47.6 ug/mL

## 2022-10-15 LAB — CBC WITH DIFFERENTIAL/PLATELET
Basophils Absolute: 0.1 10*3/uL (ref 0.0–0.2)
Basos: 1 %
EOS (ABSOLUTE): 0.2 10*3/uL (ref 0.0–0.4)
Eos: 3 %
Hematocrit: 41.9 % (ref 34.0–46.6)
Hemoglobin: 14.7 g/dL (ref 11.1–15.9)
Immature Grans (Abs): 0 10*3/uL (ref 0.0–0.1)
Immature Granulocytes: 0 %
Lymphocytes Absolute: 2.3 10*3/uL (ref 0.7–3.1)
Lymphs: 29 %
MCH: 33.1 pg — ABNORMAL HIGH (ref 26.6–33.0)
MCHC: 35.1 g/dL (ref 31.5–35.7)
MCV: 94 fL (ref 79–97)
Monocytes Absolute: 0.6 10*3/uL (ref 0.1–0.9)
Monocytes: 7 %
Neutrophils Absolute: 4.9 10*3/uL (ref 1.4–7.0)
Neutrophils: 60 %
Platelets: 322 10*3/uL (ref 150–450)
RBC: 4.44 x10E6/uL (ref 3.77–5.28)
RDW: 12.1 % (ref 11.7–15.4)
WBC: 8.1 10*3/uL (ref 3.4–10.8)

## 2022-10-15 LAB — LIPID PANEL W/O CHOL/HDL RATIO
Cholesterol, Total: 145 mg/dL (ref 100–199)
HDL: 36 mg/dL — ABNORMAL LOW (ref >39)
LDL Chol Calc (NIH): 70 mg/dL (ref 0–99)
Triglycerides: 237 mg/dL — ABNORMAL HIGH (ref 0–149)
VLDL Cholesterol Cal: 39 mg/dL (ref 5–40)

## 2022-10-15 LAB — VITAMIN D 25 HYDROXY (VIT D DEFICIENCY, FRACTURES): Vit D, 25-Hydroxy: 33.9 ng/mL (ref 30.0–100.0)

## 2022-10-15 LAB — TSH: TSH: 2.3 u[IU]/mL (ref 0.450–4.500)

## 2022-10-15 MED ORDER — ATORVASTATIN CALCIUM 40 MG PO TABS: 40.0000 mg | ORAL_TABLET | Freq: Every day | ORAL | 4 refills | Status: DC

## 2022-10-15 NOTE — Progress Notes (Signed)
Contacted via [place] morning Manus Gunning, your labs have returned: - CBC remains stable with no anemia or infection. - Kidney function continues to show chronic kidney disease stage 3a, definitely rescheduled with nephrology for further assessment:) - Liver function, AST and ALT, shows ongoing mild elevation in ALT -- we will continue to monitor since you just had surgery. - Cholesterol labs show LDL of 70, would like to see this come down a little more.  I am going to increase your Atorvastatin to 40 MG daily and stop the 20 MG dosing.  Triglycerides were also elevated -- please add some Omega 3 to supplements daily. - Remainder of labs stable.  Any questions?

## 2022-10-21 ENCOUNTER — Ambulatory Visit: Payer: Federal, State, Local not specified - PPO | Admitting: Radiation Oncology

## 2022-10-23 ENCOUNTER — Other Ambulatory Visit: Payer: Self-pay | Admitting: Oncology

## 2022-11-19 ENCOUNTER — Ambulatory Visit
Admission: RE | Admit: 2022-11-19 | Discharge: 2022-11-19 | Disposition: A | Discharge status: Home / Self Care | Payer: Federal, State, Local not specified - PPO | Source: Ambulatory Visit | Attending: Oncology | Admitting: Oncology

## 2022-11-19 DIAGNOSIS — C50919 Malignant neoplasm of unspecified site of unspecified female breast: Secondary | ICD-10-CM

## 2022-11-19 DIAGNOSIS — Z853 Personal history of malignant neoplasm of breast: Secondary | ICD-10-CM | POA: Diagnosis not present

## 2022-11-25 ENCOUNTER — Encounter: Payer: Self-pay | Admitting: Radiation Oncology

## 2022-11-25 ENCOUNTER — Ambulatory Visit
Admission: RE | Admit: 2022-11-25 | Discharge: 2022-11-25 | Disposition: A | Discharge status: Home / Self Care | Payer: Federal, State, Local not specified - PPO | Source: Ambulatory Visit | Attending: Radiation Oncology | Admitting: Radiation Oncology

## 2022-11-25 VITALS — BP 177/92 | HR 89 | Temp 98.3°F | Resp 16 | Wt 255.4 lb

## 2022-11-25 DIAGNOSIS — C50512 Malignant neoplasm of lower-outer quadrant of left female breast: Secondary | ICD-10-CM | POA: Diagnosis not present

## 2022-11-25 DIAGNOSIS — Z79811 Long term (current) use of aromatase inhibitors: Secondary | ICD-10-CM | POA: Insufficient documentation

## 2022-11-25 DIAGNOSIS — Z923 Personal history of irradiation: Secondary | ICD-10-CM | POA: Insufficient documentation

## 2022-11-25 DIAGNOSIS — Z170 Estrogen receptor positive status [ER+]: Secondary | ICD-10-CM | POA: Diagnosis not present

## 2022-11-25 DIAGNOSIS — C50919 Malignant neoplasm of unspecified site of unspecified female breast: Secondary | ICD-10-CM

## 2022-11-25 NOTE — Progress Notes (Signed)
Radiation Oncology Follow up Note  Name: Dorothy Cunningham   Date:   11/25/2022 MRN:  [id number] DOB: [birth year]    This 62 y.o. female presents to the clinic today for 6-monthfollow-up status post whole breast radiation to her left breast for stage IIa (T2 N0 M0) grade 3 ER/PR positive invasive mammary carcinoma status post wide local excision.  REFERRING PROVIDER: CVenita Lick NP  HPI: Patient is a 62year old female now out 6 months having completed whole breast radiation to her left breast for stage IIa ER positive invasive mammary carcinoma seen today in routine follow-up she is doing well.  She specifically denies breast tenderness cough or bone pain..  She had recent mammograms which I have reviewed were BI-RADS Category 2 benign.  She is currently on Arimidex tolerating it well without side effect.  COMPLICATIONS OF TREATMENT: none  FOLLOW UP COMPLIANCE: keeps appointments   PHYSICAL EXAM:  BP (!) 177/92 (BP Location: Left Wrist, Patient Position: Sitting, Cuff Size: Normal) Comment: Patient has appointment this week to discuss BP management.  Pulse 89   Temp 98.3 F (36.8 C) (Tympanic)   Resp 16   Wt 255 lb 6.4 oz (115.8 kg)   LMP  (LMP Unknown)   BMI 48.66 kg/m  Lungs are clear to A&P cardiac examination essentially unremarkable with regular rate and rhythm. No dominant mass or nodularity is noted in either breast in 2 positions examined. Incision is well-healed. No axillary or supraclavicular adenopathy is appreciated. Cosmetic result is excellent.  Well-developed well-nourished patient in NAD. HEENT reveals PERLA, EOMI, discs not visualized.  Oral cavity is clear. No oral mucosal lesions are identified. Neck is clear without evidence of cervical or supraclavicular adenopathy. Lungs are clear to A&P. Cardiac examination is essentially unremarkable with regular rate and rhythm without murmur rub or thrill. Abdomen is benign with no organomegaly or masses noted. Motor  sensory and DTR levels are equal and symmetric in the upper and lower extremities. Cranial nerves II through XII are grossly intact. Proprioception is intact. No peripheral adenopathy or edema is identified. No motor or sensory levels are noted. Crude visual fields are within normal range.  RADIOLOGY RESULTS: Mammogram and ultrasound reviewed compatible with above-stated findings  PLAN: Present time patient is doing well 6 months out with no evidence of disease.  On pleased with her overall progress.  I have asked to see her back in 6 months for follow-up.  She continues on Arimidex without side effect.  Patient is to call with any concerns.  I would like to take this opportunity to thank you for allowing me to participate in the care of your patient..Noreene Filbert MD

## 2022-12-09 ENCOUNTER — Encounter: Payer: Self-pay | Admitting: Nurse Practitioner

## 2022-12-10 MED ORDER — NYSTATIN 100000 UNIT/GM EX OINT: TOPICAL_OINTMENT | CUTANEOUS | 3 refills | Status: DC

## 2022-12-10 NOTE — Telephone Encounter (Signed)
.  Medication refill for nystatin ointment last ov 10/14/22, upcoming ov 04/14/23 . Please advise

## 2022-12-25 ENCOUNTER — Other Ambulatory Visit: Payer: Self-pay | Admitting: Nurse Practitioner

## 2022-12-25 NOTE — Telephone Encounter (Signed)
Requested Prescriptions  Pending Prescriptions Disp Refills   diclofenac Sodium (VOLTAREN) 1 % GEL [Pharmacy Med Name: DICLOFENAC 1% GEL 100GM (RX)] 100 g 0    Sig: Apply 2 g topically 4 (four) times daily as needed.     Analgesics:  Topicals Failed - 12/25/2022 10:42 AM      Failed - Manual Review: Labs are only required if the patient has taken medication for more than 8 weeks.      Failed - Cr in normal range and within 360 days    Creatinine, Ser  Date Value Ref Range Status  10/14/2022 1.22 (H) 0.57 - 1.00 mg/dL Final         Passed - PLT in normal range and within 360 days    Platelets  Date Value Ref Range Status  10/14/2022 322 150 - 450 x10E3/uL Final         Passed - HGB in normal range and within 360 days    Hemoglobin  Date Value Ref Range Status  10/14/2022 14.7 11.1 - 15.9 g/dL Final         Passed - HCT in normal range and within 360 days    Hematocrit  Date Value Ref Range Status  10/14/2022 41.9 34.0 - 46.6 % Final         Passed - eGFR is 30 or above and within 360 days    GFR calc Af Amer  Date Value Ref Range Status  11/01/2020 53 (L) >59 mL/min/1.73 Final    Comment:    **In accordance with recommendations from the NKF-ASN Task force,**   [facility] is in the process of updating its eGFR calculation to the   2021 CKD-EPI creatinine equation that estimates kidney function   without a race variable.    GFR, Estimated  Date Value Ref Range Status  08/19/2022 47 (L) >60 mL/min Final    Comment:    (NOTE) Calculated using the CKD-EPI Creatinine Equation (2021)    eGFR  Date Value Ref Range Status  10/14/2022 50 (L) >59 mL/min/1.73 Final         Passed - Patient is not pregnant      Passed - Valid encounter within last 12 months    Recent Outpatient Visits           2 months ago Invasive carcinoma of breast ([place])   [place], Henrine Screws T, NP   4 months ago Stage 3a chronic kidney disease ([place])   [place] Cannady, Jolene T, NP   5 months ago Stage 3a chronic kidney disease ([place])   [place] Cannady, Barbaraann Faster, NP   7 months ago Centrilobular emphysema ([place])   [place], Henrine Screws T, NP   12 months ago Invasive carcinoma of breast ([place])   [place], Barbaraann Faster, NP       Future Appointments             In 3 months Cannady, Barbaraann Faster, NP [place], [facility]

## 2023-01-09 ENCOUNTER — Other Ambulatory Visit: Payer: Self-pay | Admitting: Nurse Practitioner

## 2023-01-11 NOTE — Telephone Encounter (Signed)
Requested Prescriptions  Pending Prescriptions Disp Refills   amLODipine (NORVASC) 2.5 MG tablet [Pharmacy Med Name: AMLODIPINE BESYLATE 2.5MG  TABLETS] 45 tablet 2    Sig: TAKE 1 TABLET(2.5 MG) BY MOUTH DAILY     Cardiovascular: Calcium Channel Blockers 2 Failed - 01/09/2023  7:26 AM      Failed - Last BP in normal range    BP Readings from Last 1 Encounters:  11/25/22 (!) 177/92         Passed - Last Heart Rate in normal range    Pulse Readings from Last 1 Encounters:  11/25/22 89         Passed - Valid encounter within last 6 months    Recent Outpatient Visits           2 months ago Invasive carcinoma of breast (HCC)   [place] [facility] [place], Beaumont T, NP   5 months ago Stage 3a chronic kidney disease (HCC)   [place] [facility] [place], Corrie Dandy T, NP   6 months ago Stage 3a chronic kidney disease (HCC)   [place] [facility] Cannady, Corrie Dandy T, NP   7 months ago Centrilobular emphysema (HCC)   [place] [facility] [place], Corrie Dandy T, NP   1 year ago Invasive carcinoma of breast (HCC)   [place] [facility] [place], Dorie Rank, NP       Future Appointments             In 3 months Cannady, Dorie Rank, NP  [employer], [facility]

## 2023-02-02 ENCOUNTER — Other Ambulatory Visit: Payer: Self-pay | Admitting: Nurse Practitioner

## 2023-02-02 NOTE — Telephone Encounter (Signed)
Requested Prescriptions  Pending Prescriptions Disp Refills   diclofenac Sodium (VOLTAREN) 1 % GEL [Pharmacy Med Name: DICLOFENAC 1% GEL 100GM (RX)] 100 g 2    Sig: APPLY 2 GRAMS TOPICALLY TO THE AFFECTED AREA FOUR TIMES DAILY AS NEEDED     Analgesics:  Topicals Failed - 02/02/2023  6:35 AM      Failed - Manual Review: Labs are only required if the patient has taken medication for more than 8 weeks.      Failed - Cr in normal range and within 360 days    Creatinine, Ser  Date Value Ref Range Status  10/14/2022 1.22 (H) 0.57 - 1.00 mg/dL Final         Passed - PLT in normal range and within 360 days    Platelets  Date Value Ref Range Status  10/14/2022 322 150 - 450 x10E3/uL Final         Passed - HGB in normal range and within 360 days    Hemoglobin  Date Value Ref Range Status  10/14/2022 14.7 11.1 - 15.9 g/dL Final         Passed - HCT in normal range and within 360 days    Hematocrit  Date Value Ref Range Status  10/14/2022 41.9 34.0 - 46.6 % Final         Passed - eGFR is 30 or above and within 360 days    GFR calc Af Amer  Date Value Ref Range Status  11/01/2020 53 (L) >59 mL/min/1.73 Final    Comment:    **In accordance with recommendations from the NKF-ASN Task force,**   [facility] is in the process of updating its eGFR calculation to the   2021 CKD-EPI creatinine equation that estimates kidney function   without a race variable.    GFR, Estimated  Date Value Ref Range Status  08/19/2022 47 (L) >60 mL/min Final    Comment:    (NOTE) Calculated using the CKD-EPI Creatinine Equation (2021)    eGFR  Date Value Ref Range Status  10/14/2022 50 (L) >59 mL/min/1.73 Final         Passed - Patient is not pregnant      Passed - Valid encounter within last 12 months    Recent Outpatient Visits           3 months ago Invasive carcinoma of breast (HCC)   [place] [facility] [place], Corrie Dandy T, NP   5 months ago Stage 3a chronic kidney disease  (HCC)   [place] [facility] [place], Corrie Dandy T, NP   7 months ago Stage 3a chronic kidney disease (HCC)   [place] [facility] Cannady, Corrie Dandy T, NP   8 months ago Centrilobular emphysema (HCC)   [place] [facility] [place], Corrie Dandy T, NP   1 year ago Invasive carcinoma of breast (HCC)   [place] [facility] [place], Dorie Rank, NP       Future Appointments             In 2 months Cannady, Dorie Rank, NP [place] [facility], [facility]

## 2023-02-17 ENCOUNTER — Other Ambulatory Visit: Payer: Federal, State, Local not specified - PPO

## 2023-02-17 ENCOUNTER — Ambulatory Visit: Payer: Federal, State, Local not specified - PPO | Admitting: Oncology

## 2023-03-10 ENCOUNTER — Inpatient Hospital Stay: Payer: Federal, State, Local not specified - PPO | Admitting: Oncology

## 2023-03-10 ENCOUNTER — Inpatient Hospital Stay: Payer: Federal, State, Local not specified - PPO

## 2023-03-19 ENCOUNTER — Other Ambulatory Visit: Payer: Self-pay | Admitting: *Deleted

## 2023-03-19 ENCOUNTER — Encounter: Payer: Self-pay | Admitting: Oncology

## 2023-03-19 ENCOUNTER — Other Ambulatory Visit: Payer: Self-pay

## 2023-03-19 ENCOUNTER — Inpatient Hospital Stay: Payer: Federal, State, Local not specified - PPO | Attending: Oncology

## 2023-03-19 ENCOUNTER — Inpatient Hospital Stay (HOSPITAL_BASED_OUTPATIENT_CLINIC_OR_DEPARTMENT_OTHER): Payer: Federal, State, Local not specified - PPO | Admitting: Oncology

## 2023-03-19 VITALS — BP 146/91 | HR 82 | Temp 97.6°F | Resp 18 | Wt 254.1 lb

## 2023-03-19 DIAGNOSIS — Z8249 Family history of ischemic heart disease and other diseases of the circulatory system: Secondary | ICD-10-CM | POA: Diagnosis not present

## 2023-03-19 DIAGNOSIS — Z8261 Family history of arthritis: Secondary | ICD-10-CM | POA: Insufficient documentation

## 2023-03-19 DIAGNOSIS — Z170 Estrogen receptor positive status [ER+]: Secondary | ICD-10-CM | POA: Insufficient documentation

## 2023-03-19 DIAGNOSIS — C50512 Malignant neoplasm of lower-outer quadrant of left female breast: Secondary | ICD-10-CM | POA: Insufficient documentation

## 2023-03-19 DIAGNOSIS — Z880 Allergy status to penicillin: Secondary | ICD-10-CM | POA: Insufficient documentation

## 2023-03-19 DIAGNOSIS — Z87891 Personal history of nicotine dependence: Secondary | ICD-10-CM | POA: Insufficient documentation

## 2023-03-19 DIAGNOSIS — Z6841 Body Mass Index (BMI) 40.0 and over, adult: Secondary | ICD-10-CM | POA: Diagnosis not present

## 2023-03-19 DIAGNOSIS — N1831 Chronic kidney disease, stage 3a: Secondary | ICD-10-CM

## 2023-03-19 DIAGNOSIS — Z825 Family history of asthma and other chronic lower respiratory diseases: Secondary | ICD-10-CM | POA: Insufficient documentation

## 2023-03-19 DIAGNOSIS — C50919 Malignant neoplasm of unspecified site of unspecified female breast: Secondary | ICD-10-CM | POA: Diagnosis not present

## 2023-03-19 DIAGNOSIS — Z79811 Long term (current) use of aromatase inhibitors: Secondary | ICD-10-CM

## 2023-03-19 DIAGNOSIS — I129 Hypertensive chronic kidney disease with stage 1 through stage 4 chronic kidney disease, or unspecified chronic kidney disease: Secondary | ICD-10-CM | POA: Diagnosis not present

## 2023-03-19 DIAGNOSIS — E785 Hyperlipidemia, unspecified: Secondary | ICD-10-CM | POA: Insufficient documentation

## 2023-03-19 DIAGNOSIS — Z833 Family history of diabetes mellitus: Secondary | ICD-10-CM | POA: Insufficient documentation

## 2023-03-19 DIAGNOSIS — Z803 Family history of malignant neoplasm of breast: Secondary | ICD-10-CM | POA: Insufficient documentation

## 2023-03-19 DIAGNOSIS — Z8349 Family history of other endocrine, nutritional and metabolic diseases: Secondary | ICD-10-CM | POA: Insufficient documentation

## 2023-03-19 DIAGNOSIS — Z79899 Other long term (current) drug therapy: Secondary | ICD-10-CM | POA: Diagnosis not present

## 2023-03-19 DIAGNOSIS — G4730 Sleep apnea, unspecified: Secondary | ICD-10-CM | POA: Insufficient documentation

## 2023-03-19 LAB — CBC WITH DIFFERENTIAL (CANCER CENTER ONLY)
Abs Immature Granulocytes: 0.03 10*3/uL (ref 0.00–0.07)
Basophils Absolute: 0.1 10*3/uL (ref 0.0–0.1)
Basophils Relative: 1 %
Eosinophils Absolute: 0.2 10*3/uL (ref 0.0–0.5)
Eosinophils Relative: 2 %
HCT: 41.9 % (ref 36.0–46.0)
Hemoglobin: 14.4 g/dL (ref 12.0–15.0)
Immature Granulocytes: 0 %
Lymphocytes Relative: 30 %
Lymphs Abs: 2.8 10*3/uL (ref 0.7–4.0)
MCH: 32 pg (ref 26.0–34.0)
MCHC: 34.4 g/dL (ref 30.0–36.0)
MCV: 93.1 fL (ref 80.0–100.0)
Monocytes Absolute: 0.5 10*3/uL (ref 0.1–1.0)
Monocytes Relative: 5 %
Neutro Abs: 5.8 10*3/uL (ref 1.7–7.7)
Neutrophils Relative %: 62 %
Platelet Count: 258 10*3/uL (ref 150–400)
RBC: 4.5 MIL/uL (ref 3.87–5.11)
RDW: 12.5 % (ref 11.5–15.5)
WBC Count: 9.4 10*3/uL (ref 4.0–10.5)
nRBC: 0 % (ref 0.0–0.2)

## 2023-03-19 LAB — CMP (CANCER CENTER ONLY)
ALT: 37 U/L (ref 0–44)
AST: 25 U/L (ref 15–41)
Albumin: 4 g/dL (ref 3.5–5.0)
Alkaline Phosphatase: 65 U/L (ref 38–126)
Anion gap: 11 (ref 5–15)
BUN: 15 mg/dL (ref 8–23)
CO2: 25 mmol/L (ref 22–32)
Calcium: 9.6 mg/dL (ref 8.9–10.3)
Chloride: 103 mmol/L (ref 98–111)
Creatinine: 1.15 mg/dL — ABNORMAL HIGH (ref 0.44–1.00)
GFR, Estimated: 53 mL/min — ABNORMAL LOW (ref >60)
Glucose, Bld: 110 mg/dL — ABNORMAL HIGH (ref 70–99)
Potassium: 3.6 mmol/L (ref 3.5–5.1)
Sodium: 139 mmol/L (ref 135–145)
Total Bilirubin: 0.5 mg/dL (ref 0.3–1.2)
Total Protein: 7.4 g/dL (ref 6.5–8.1)

## 2023-03-19 MED ORDER — ANASTROZOLE 1 MG PO TABS: 1.0000 mg | ORAL_TABLET | Freq: Every day | ORAL | 5 refills | Status: DC

## 2023-03-19 NOTE — Progress Notes (Signed)
Hematology/Oncology progress note [phone] [phone]         Patient Care Team: Marjie Skiff, NP as PCP - General (Nurse Practitioner) Scarlett Presto, RN (Inactive) as Oncology Nurse Navigator Rickard Patience, MD as Consulting Physician (Oncology)  ASSESSMENT & PLAN:   Cancer Staging  Invasive carcinoma of breast [facility]) Staging form: Breast, AJCC 8th Edition - Clinical stage from 11/14/2021: Stage IIA (cT2, cN0, cM0, G3, ER+, PR+, HER2-) - Signed by Rickard Patience, MD on 04/28/2022    Invasive carcinoma of breast (HCC) Stage IIA left breast cancer, pT2 pN0 ER+, PR+, HER- Oncotype Dx 14, no adjuvant chemotherapy benefit. S/p adjuvant Radiation.  Patient tolerates Arimidex  Labs are reviewed and discussed with patient. Continue Arimidex 1 mg daily.  Refills were sent to pharmacy. Cotninue annual diagnostic mammogram in February 2025  Stage 3a chronic kidney disease (HCC) Avoid nephrotoxins.  Encourage oral hydration  Aromatase inhibitor use Recommend calcium and vitamin D supplementation DEXA showed normal bone density.   Orders Placed This Encounter  Procedures   CBC with Differential (Cancer Center Only)    Standing Status:   Future    Standing Expiration Date:   03/18/2024   CMP (Cancer Center only)    Standing Status:   Future    Standing Expiration Date:   03/18/2024   Follow-up 6 months.  All questions were answered. The patient knows to call the clinic with any problems, questions or concerns.  Rickard Patience, MD, PhD [facility] Hematology Oncology 03/19/2023   CHIEF COMPLAINTS/REASON FOR VISIT:  Follow up for left breast cancer  HISTORY OF PRESENTING ILLNESS:   Dorothy Cunningham is a  62 y.o.  female presents to follow-up for Stage Iia left breast cancer Oncological history summary as below. Oncology History  Invasive carcinoma of breast (HCC)  11/14/2021 Initial Diagnosis   Invasive carcinoma of breast [facility]) Patient felt a left breast  mass. 10/30/2021, bilateral diagnostic mammogram showed 21 mm mass in the left breast, 5:00, 9 cm from nipple.  No suspicious left axillary lymphadenopathy.  No mammographic evidence of malignancy in the right breast.   11/06/2021, left breast ultrasound-guided biopsy showed invasive mammary carcinoma, no special type.  Grade 3, DCIS not identified, LVI not identified.  ER+/PR +HER2 - Menarche 62 years of age, patient has 3 children.  She was 11 years old when she gave birth to her first child.  Short-term OCP use from [phone].  Patient has a history of hysterectomy 1990.  She denies family history of breast cancer. Denies any hormone replacement therapy.  Denies any chest radiation.  She recalls left breast biopsy in 2012.  She did not know details of the pathology.   11/14/2021 Cancer Staging   Staging form: Breast, AJCC 8th Edition - Clinical stage from 11/14/2021: Stage IIA (cT2, cN0, cM0, G3, ER+, PR+, HER2-) - Signed by Rickard Patience, MD on 04/28/2022 Stage prefix: Initial diagnosis Histologic grading system: 3 grade system   01/26/2022 Surgery   Left breast lumpectomy and sentinel lymph node biopsy. Invasive mammary carcinoma, DCIS, 3 lymph nodes negative for malignancy.  Grade 3, all margins negative for invasive carcinoma.  Distance from closest margin 4 mm.  All margins negative for DCIS. pT2 pN0   01/26/2022 Oncotype testing   Oncotype DX breast recurrence score  14   04/01/2022 - 04/14/2022 Radiation Therapy   Adjuvant left breast radiation   06/29/2022 Imaging   DEXA normal bone density    Patient reports feeling well.  She tolerates Arimidex 1mg  daily.    Review of Systems  Constitutional:  Negative for appetite change, chills, fatigue and fever.  HENT:   Negative for hearing loss and voice change.   Eyes:  Negative for eye problems.  Respiratory:  Negative for chest tightness and cough.   Cardiovascular:  Negative for chest pain.  Gastrointestinal:  Negative for abdominal distention,  abdominal pain and blood in stool.  Endocrine: Negative for hot flashes.  Genitourinary:  Negative for difficulty urinating and frequency.   Musculoskeletal:  Negative for arthralgias.  Skin:  Negative for itching and rash.  Neurological:  Negative for extremity weakness.  Hematological:  Negative for adenopathy.  Psychiatric/Behavioral:  Negative for confusion.     MEDICAL HISTORY:  Past Medical History:  Diagnosis Date   Allergic rhinitis    Anxiety    Breast cancer, left breast (HCC) 10/2021   Chronic kidney disease, stage 3a (HCC)    COPD (chronic obstructive pulmonary disease) (HCC)    History of epilepsy    as a teenager   Hyperlipidemia    Hypertension    Morbid obesity with BMI of 45.0-49.9, adult (HCC)    Sleep apnea     SURGICAL HISTORY: Past Surgical History:  Procedure Laterality Date   Bladder Tuck  1995   BREAST BIOPSY Left 2014   benign   BREAST BIOPSY Left 11/06/2021   u/s bx  5 o'clock, VENUS clip- INVASIVE MAMMARY CARCINOMA,   BREAST LUMPECTOMY WITH SENTINEL LYMPH NODE BIOPSY Left 01/26/2022   Procedure: BREAST LUMPECTOMY WITH SENTINEL LYMPH NODE BX;  Surgeon: Earline Mayotte, MD;  Location: [facility] ORS;  Service: General;  Laterality: Left;   COLONOSCOPY     PARTIAL HYSTERECTOMY  1995   due to heavy periods    SOCIAL HISTORY: Social History   Socioeconomic History   Marital status: Married    Spouse name: Charles   Number of children: 4   Years of education: Not on file   Highest education level: Not on file  Occupational History   Not on file  Tobacco Use   Smoking status: Former    Packs/day: 0.75    Years: 40.00    Additional pack years: 0.00    Total pack years: 30.00    Types: Cigarettes    Quit date: 09/28/2014    Years since quitting: 8.4   Smokeless tobacco: Never  Vaping Use   Vaping Use: Former  Substance and Sexual Activity   Alcohol use: No   Drug use: No   Sexual activity: Yes  Other Topics Concern   Not on file   Social History Narrative   Not on file   Social Determinants of Health   Financial Resource Strain: Low Risk  (10/14/2022)   Overall Financial Resource Strain (CARDIA)    Difficulty of Paying Living Expenses: Not hard at all  Food Insecurity: No Food Insecurity (10/14/2022)   Hunger Vital Sign    Worried About Running Out of Food in the Last Year: Never true    Ran Out of Food in the Last Year: Never true  Transportation Needs: No Transportation Needs (10/14/2022)   PRAPARE - Administrator, Civil Service (Medical): No    Lack of Transportation (Non-Medical): No  Physical Activity: Insufficiently Active (10/14/2022)   Exercise Vital Sign    Days of Exercise per Week: 2 days    Minutes of Exercise per Session: 30 min  Stress: No Stress Concern Present (10/14/2022)   [place]  Institute of Occupational Health - Occupational Stress Questionnaire    Feeling of Stress : Not at all  Social Connections: Socially Integrated (10/14/2022)   Social Connection and Isolation Panel [NHANES]    Frequency of Communication with Friends and Family: More than three times a week    Frequency of Social Gatherings with Friends and Family: More than three times a week    Attends Religious Services: More than 4 times per year    Active Member of [employer] or Organizations: Yes    Attends Banker Meetings: Never    Marital Status: Married  Catering manager Violence: Not At Risk (10/14/2022)   Humiliation, Afraid, Rape, and Kick questionnaire    Fear of Current or Ex-Partner: No    Emotionally Abused: No    Physically Abused: No    Sexually Abused: No    FAMILY HISTORY: Family History  Problem Relation Age of Onset   Thyroid disease Mother    Asthma Mother    Osteoarthritis Father    CAD Father    Diabetes Maternal Uncle    Diabetes Maternal Grandfather    Tuberculosis Maternal Grandfather    Diabetes Paternal Grandmother    Heart attack Paternal Grandfather    Breast cancer  Other        great grandmother    ALLERGIES:  is allergic to penicillins.  MEDICATIONS:  Current Outpatient Medications  Medication Sig Dispense Refill   albuterol (VENTOLIN HFA) 108 (90 Base) MCG/ACT inhaler INHALE 2 PUFFS INTO THE LUNGS EVERY 6 HOURS AS NEEDED FOR WHEEZING OR SHORTNESS OF BREATH 6.7 g 0   amLODipine (NORVASC) 2.5 MG tablet TAKE 1 TABLET(2.5 MG) BY MOUTH DAILY 45 tablet 2   aspirin 81 MG EC tablet Take 81 mg by mouth daily.     atorvastatin (LIPITOR) 40 MG tablet Take 1 tablet (40 mg total) by mouth daily. 90 tablet 4   b complex vitamins capsule Take 1 capsule by mouth daily.     buPROPion (WELLBUTRIN XL) 300 MG 24 hr tablet TAKE 1 TABLET(300 MG) BY MOUTH DAILY 90 tablet 4   Calcium-Magnesium-Vitamin D (CALCIUM 1200+D3 PO)      Cholecalciferol (VITAMIN D) 50 MCG (2000 UT) CAPS Take 4,000 Units by mouth daily.     diclofenac Sodium (VOLTAREN) 1 % GEL APPLY 2 GRAMS TOPICALLY TO THE AFFECTED AREA FOUR TIMES DAILY AS NEEDED 100 g 2   diphenhydrAMINE (BENADRYL) 25 MG tablet Take 25 mg by mouth daily.     fluticasone (FLONASE) 50 MCG/ACT nasal spray SHAKE LIQUID AND USE 2 SPRAYS IN EACH NOSTRIL DAILY 48 g 6   Garlic (GARLIQUE PO) Take 1 tablet by mouth daily.     losartan (COZAAR) 100 MG tablet Take 1 tablet (100 mg total) by mouth daily. 90 tablet 4   Multiple Vitamin (MULTIVITAMIN) tablet Take 1 tablet by mouth daily.     nystatin ointment (MYCOSTATIN) APPLY TOPICALLY TO THE AFFECTED AREA THREE TIMES DAILY 60 g 3   Omega-3 Fatty Acids (FISH OIL PO) Take 1 capsule by mouth daily.     umeclidinium-vilanterol (ANORO ELLIPTA) 62.5-25 MCG/ACT AEPB INHALE 1 PUFF INTO THE LUNGS DAILY AT 6 AM 60 each 5   anastrozole (ARIMIDEX) 1 MG tablet Take 1 tablet (1 mg total) by mouth daily. 30 tablet 5   No current facility-administered medications for this visit.     PHYSICAL EXAMINATION: ECOG PERFORMANCE STATUS: 0 - Asymptomatic Vitals:   03/19/23 1013  BP: (!) 146/91  Pulse: 82  Resp: 18  Temp: 97.6 F (36.4 C)  SpO2: 99%   Filed Weights   03/19/23 1013  Weight: 254 lb 1.6 oz (115.3 kg)    Physical Exam Constitutional:      General: She is not in acute distress. HENT:     Head: Normocephalic and atraumatic.  Eyes:     General: No scleral icterus. Cardiovascular:     Rate and Rhythm: Normal rate and regular rhythm.     Heart sounds: Normal heart sounds.  Pulmonary:     Effort: Pulmonary effort is normal. No respiratory distress.     Breath sounds: No wheezing.  Abdominal:     General: Bowel sounds are normal. There is no distension.     Palpations: Abdomen is soft.  Musculoskeletal:        General: No deformity. Normal range of motion.     Cervical back: Normal range of motion and neck supple.  Skin:    General: Skin is warm and dry.     Findings: No erythema or rash.  Neurological:     Mental Status: She is alert and oriented to person, place, and time. Mental status is at baseline.     Cranial Nerves: No cranial nerve deficit.     Coordination: Coordination normal.  Psychiatric:        Mood and Affect: Mood normal.   Breast exam was performed in seated and lying down position. Patient is status post left lumpectomy with a well-healed surgical scar.   No palpable breast masses bilaterally.  No palpable axillary adenopathy bilaterally.   LABORATORY DATA:  I have reviewed the data as listed     Latest Ref Rng & Units 03/19/2023   10:00 AM 10/14/2022    1:35 PM 08/19/2022    9:30 AM  CBC  WBC 4.0 - 10.5 K/uL 9.4  8.1  8.6   Hemoglobin 12.0 - 15.0 g/dL 65.7  84.6  96.2   Hematocrit 36.0 - 46.0 % 41.9  41.9  42.9   Platelets 150 - 400 K/uL 258  322  301       Latest Ref Rng & Units 03/19/2023   10:00 AM 10/14/2022    1:35 PM 08/19/2022    9:30 AM  CMP  Glucose 70 - 99 mg/dL 952  90  841   BUN 8 - 23 mg/dL 15  13  19    Creatinine 0.44 - 1.00 mg/dL 3.24  4.01  0.27   Sodium 135 - 145 mmol/L 139  140  138   Potassium 3.5 - 5.1 mmol/L  3.6  4.1  3.9   Chloride 98 - 111 mmol/L 103  103  105   CO2 22 - 32 mmol/L 25  19  24    Calcium 8.9 - 10.3 mg/dL 9.6  9.5  9.1   Total Protein 6.5 - 8.1 g/dL 7.4  6.7  7.7   Total Bilirubin 0.3 - 1.2 mg/dL 0.5  0.4  0.5   Alkaline Phos 38 - 126 U/L 65  71  63   AST 15 - 41 U/L 25  32  39   ALT 0 - 44 U/L 37  51  48       RADIOGRAPHIC STUDIES: I have personally reviewed the radiological images as listed and agreed with the findings in the report. No results found.

## 2023-03-19 NOTE — Assessment & Plan Note (Signed)
Avoid nephrotoxins. Encourage oral hydration.  

## 2023-03-19 NOTE — Assessment & Plan Note (Signed)
Recommend calcium and vitamin D supplementation DEXA showed normal bone density. 

## 2023-03-19 NOTE — Assessment & Plan Note (Addendum)
Stage IIA left breast cancer, pT2 pN0 ER+, PR+, HER- Oncotype Dx 14, no adjuvant chemotherapy benefit. S/p adjuvant Radiation.  Patient tolerates Arimidex  Labs are reviewed and discussed with patient. Continue Arimidex 1 mg daily.  Refills were sent to pharmacy. Cotninue annual diagnostic mammogram in February 2025

## 2023-03-31 ENCOUNTER — Other Ambulatory Visit: Payer: Self-pay | Admitting: Acute Care

## 2023-03-31 DIAGNOSIS — Z122 Encounter for screening for malignant neoplasm of respiratory organs: Secondary | ICD-10-CM

## 2023-03-31 DIAGNOSIS — Z87891 Personal history of nicotine dependence: Secondary | ICD-10-CM

## 2023-04-11 NOTE — Patient Instructions (Signed)

## 2023-04-14 ENCOUNTER — Encounter: Payer: Self-pay | Admitting: Nurse Practitioner

## 2023-04-14 ENCOUNTER — Ambulatory Visit: Payer: Federal, State, Local not specified - PPO | Admitting: Nurse Practitioner

## 2023-04-14 VITALS — BP 138/84 | HR 86 | Temp 98.1°F | Ht 60.75 in | Wt 254.8 lb

## 2023-04-14 DIAGNOSIS — Z87891 Personal history of nicotine dependence: Secondary | ICD-10-CM

## 2023-04-14 DIAGNOSIS — E782 Mixed hyperlipidemia: Secondary | ICD-10-CM

## 2023-04-14 DIAGNOSIS — G4733 Obstructive sleep apnea (adult) (pediatric): Secondary | ICD-10-CM

## 2023-04-14 DIAGNOSIS — N1831 Chronic kidney disease, stage 3a: Secondary | ICD-10-CM

## 2023-04-14 DIAGNOSIS — I700 Atherosclerosis of aorta: Secondary | ICD-10-CM

## 2023-04-14 DIAGNOSIS — C50919 Malignant neoplasm of unspecified site of unspecified female breast: Secondary | ICD-10-CM

## 2023-04-14 DIAGNOSIS — F321 Major depressive disorder, single episode, moderate: Secondary | ICD-10-CM

## 2023-04-14 DIAGNOSIS — I10 Essential (primary) hypertension: Secondary | ICD-10-CM

## 2023-04-14 DIAGNOSIS — Z23 Encounter for immunization: Secondary | ICD-10-CM | POA: Diagnosis not present

## 2023-04-14 DIAGNOSIS — Z79811 Long term (current) use of aromatase inhibitors: Secondary | ICD-10-CM

## 2023-04-14 DIAGNOSIS — J432 Centrilobular emphysema: Secondary | ICD-10-CM

## 2023-04-14 NOTE — Assessment & Plan Note (Signed)
 Ongoing.  Continue collaboration with oncology at this time.  Recent notes reviewed.

## 2023-04-14 NOTE — Assessment & Plan Note (Signed)
Chronic, ongoing.  FEV1 62% and FEV1/FVC 81% in February 2023.  Continue Anoro + continue Albuterol as needed, discussed at length with patient.  Continue lung screening annually, as past smoker with 30 year smoking history.  Refills as needed.  Spirometry next visit.

## 2023-04-14 NOTE — Progress Notes (Signed)
BP 138/84 (BP Location: Left Arm, Patient Position: Sitting, Cuff Size: Large)   Pulse 86   Temp 98.1 F (36.7 C) (Oral)   Ht 5' 0.75" (1.543 m)   Wt 254 lb 12.8 oz (115.6 kg)   LMP  (LMP Unknown)   SpO2 99%   BMI 48.54 kg/m    Subjective:    Patient ID: Dorothy Cunningham, female    DOB: 1961/09/15, 62 y.o.   MRN: [id number]  HPI: Dorothy Cunningham is a 62 y.o. female  Chief Complaint  Patient presents with   Hypertension   Hyperlipidemia   COPD   Mood   Breast Cancer   HYPERTENSION / HYPERLIPIDEMIA Taking Losartan, Amlodipine, and Atorvastatin -- ran out of Losartan for 2 days and now back on.   OSA diagnosed several years ago does not use CPAP, does head of bed elevation. Satisfied with current treatment? yes Duration of hypertension: chronic BP monitoring frequency: not checking BP range:  BP medication side effects: no Duration of hyperlipidemia: chronic Cholesterol medication side effects: no Cholesterol supplements: fish oil Medication compliance: good compliance Aspirin: yes Recent stressors: no Recurrent headaches: no Visual changes: no Palpitations: no Dyspnea: no Chest pain: no Lower extremity edema: no Dizzy/lightheaded: no  The 10-year ASCVD risk score (Arnett DK, et al., 2019) is: 7.9%   Values used to calculate the score:     Age: 2 years     Sex: Female     Is Non-Hispanic African American: No     Diabetic: No     Tobacco smoker: No     Systolic Blood Pressure: 138 mmHg     Is BP treated: Yes     HDL Cholesterol: 36 mg/dL     Total Cholesterol: 145 mg/dL  BREAST CANCER: Follow-up.  Diagnosed on 11/14/21 with invasive carcinoma left breast, no lymph node involvement noted.  Seen by oncology last 03/19/23 & Dr. Sherrine Maples 11/25/22, she finished radiation treatment on 04/14/22.  Continues on Arimidex daily. Continues to obtain mammograms and bone density testing.   COPD Using Anoro daily and Albuterol as needed.  Quit smoking >10 years ago,  smoked cigarettes for 30 years -- started at age 34 -- smoked about 1 PPD.  Known emphysema and aortic atherosclerosis on imaging.   Lung screening 06/17/22 noted benign appearance with exception of some hepatic steatosis and severe asymmetric left renal atrophy.  COPD status: stable Satisfied with current treatment?: yes Oxygen use: no Dyspnea frequency: no Cough frequency: no Rescue inhaler frequency: not used in months Limitation of activity: no Productive cough: no Last Spirometry:  FEV1 62% and FEV1/FVC 81% February 2023, previous 88% and 112% Pneumovax: Up To Date Influenza: Up to Date   CHRONIC KIDNEY DISEASE Proteinuria 150 past checks. CKD 3a. CKD status: stable Medications renally dose: yes Previous renal evaluation: no Pneumovax:  Up to Date Influenza Vaccine:  Up to Date    DEPRESSION Taking Wellbutrin for mood with benefit. Mood status: stable Satisfied with current treatment?: yes Symptom severity: mild  Duration of current treatment : chronic Side effects: no Medication compliance: good compliance Psychotherapy/counseling: none Depressed mood: no Anxious mood: no Anhedonia: no Significant weight loss or gain: no Insomnia: none Fatigue: no Feelings of worthlessness or guilt: no Impaired concentration/indecisiveness: no Suicidal ideations: no Hopelessness: no Crying spells: no    04/14/2023    1:37 PM 10/14/2022    1:26 PM 08/14/2022   10:51 AM 07/07/2022    2:37 PM 05/26/2022   10:31 AM  Depression screen PHQ 2/9  Decreased Interest 0 0 0 0 0  Down, Depressed, Hopeless 0 0 0 0 0  PHQ - 2 Score 0 0 0 0 0  Altered sleeping 0 0 0 0 0  Tired, decreased energy 2 0 1 0 0  Change in appetite 1 0 0 0 0  Feeling bad or failure about yourself  0 0 0 0 0  Trouble concentrating 0 0 0 0 0  Moving slowly or fidgety/restless 0 0 0 0 0  Suicidal thoughts 0 0 0 0 0  PHQ-9 Score 3 0 1 0 0  Difficult doing work/chores Not difficult at all Not difficult at all Not  difficult at all Not difficult at all Not difficult at all       04/14/2023    1:37 PM 10/14/2022    1:26 PM 08/14/2022   10:51 AM 07/07/2022    2:37 PM  GAD 7 : Generalized Anxiety Score  Nervous, Anxious, on Edge 0 0 0 1  Control/stop worrying 0 0 0 0  Worry too much - different things 0 0 0 1  Trouble relaxing 0 0 0 0  Restless 0 0 0 0  Easily annoyed or irritable 0 0 0 1  Afraid - awful might happen 0 0 0 1  Total GAD 7 Score 0 0 0 4  Anxiety Difficulty Not difficult at all Not difficult at all Not difficult at all Somewhat difficult   Relevant past medical, surgical, family and social history reviewed and updated as indicated. Interim medical history since our last visit reviewed. Allergies and medications reviewed and updated.  Review of Systems  Constitutional:  Negative for activity change, appetite change, diaphoresis, fatigue and fever.  Respiratory:  Negative for cough, chest tightness, shortness of breath and wheezing.   Cardiovascular:  Negative for chest pain, palpitations and leg swelling.  Gastrointestinal: Negative.   Neurological: Negative.   Psychiatric/Behavioral: Negative.      Per HPI unless specifically indicated above     Objective:    BP 138/84 (BP Location: Left Arm, Patient Position: Sitting, Cuff Size: Large)   Pulse 86   Temp 98.1 F (36.7 C) (Oral)   Ht 5' 0.75" (1.543 m)   Wt 254 lb 12.8 oz (115.6 kg)   LMP  (LMP Unknown)   SpO2 99%   BMI 48.54 kg/m   Wt Readings from Last 3 Encounters:  04/14/23 254 lb 12.8 oz (115.6 kg)  03/19/23 254 lb 1.6 oz (115.3 kg)  11/25/22 255 lb 6.4 oz (115.8 kg)    Physical Exam Vitals and nursing note reviewed.  Constitutional:      General: She is awake. She is not in acute distress.    Appearance: She is well-developed and well-groomed. She is morbidly obese. She is not ill-appearing.  HENT:     Head: Normocephalic.     Right Ear: Hearing normal.     Left Ear: Hearing normal.  Eyes:     General:  Lids are normal.        Right eye: No discharge.        Left eye: No discharge.     Conjunctiva/sclera: Conjunctivae normal.     Pupils: Pupils are equal, round, and reactive to light.  Neck:     Thyroid: No thyromegaly.     Vascular: No carotid bruit.  Cardiovascular:     Rate and Rhythm: Normal rate and regular rhythm.     Heart sounds: Normal heart sounds. No  murmur heard.    No gallop.  Pulmonary:     Effort: Pulmonary effort is normal. No accessory muscle usage or respiratory distress.     Breath sounds: Normal breath sounds.  Abdominal:     General: Bowel sounds are normal.     Palpations: Abdomen is soft.  Musculoskeletal:     Cervical back: Normal range of motion and neck supple.     Right lower leg: No edema.     Left lower leg: No edema.  Skin:    General: Skin is warm and dry.  Neurological:     Mental Status: She is alert and oriented to person, place, and time.  Psychiatric:        Attention and Perception: Attention normal.        Mood and Affect: Mood normal.        Speech: Speech normal.        Behavior: Behavior normal. Behavior is cooperative.        Thought Content: Thought content normal.     Results for orders placed or performed in visit on 03/19/23  CMP (Cancer Center only)  Result Value Ref Range   Sodium 139 135 - 145 mmol/L   Potassium 3.6 3.5 - 5.1 mmol/L   Chloride 103 98 - 111 mmol/L   CO2 25 22 - 32 mmol/L   Glucose, Bld 110 (H) 70 - 99 mg/dL   BUN 15 8 - 23 mg/dL   Creatinine 2.95 (H) 6.21 - 1.00 mg/dL   Calcium 9.6 8.9 - 30.8 mg/dL   Total Protein 7.4 6.5 - 8.1 g/dL   Albumin 4.0 3.5 - 5.0 g/dL   AST 25 15 - 41 U/L   ALT 37 0 - 44 U/L   Alkaline Phosphatase 65 38 - 126 U/L   Total Bilirubin 0.5 0.3 - 1.2 mg/dL   GFR, Estimated 53 (L) >60 mL/min   Anion gap 11 5 - 15  CBC with Differential (Cancer Center Only)  Result Value Ref Range   WBC Count 9.4 4.0 - 10.5 K/uL   RBC 4.50 3.87 - 5.11 MIL/uL   Hemoglobin 14.4 12.0 - 15.0 g/dL    HCT 65.7 84.6 - 96.2 %   MCV 93.1 80.0 - 100.0 fL   MCH 32.0 26.0 - 34.0 pg   MCHC 34.4 30.0 - 36.0 g/dL   RDW 95.2 84.1 - 32.4 %   Platelet Count 258 150 - 400 K/uL   nRBC 0.0 0.0 - 0.2 %   Neutrophils Relative % 62 %   Neutro Abs 5.8 1.7 - 7.7 K/uL   Lymphocytes Relative 30 %   Lymphs Abs 2.8 0.7 - 4.0 K/uL   Monocytes Relative 5 %   Monocytes Absolute 0.5 0.1 - 1.0 K/uL   Eosinophils Relative 2 %   Eosinophils Absolute 0.2 0.0 - 0.5 K/uL   Basophils Relative 1 %   Basophils Absolute 0.1 0.0 - 0.1 K/uL   Immature Granulocytes 0 %   Abs Immature Granulocytes 0.03 0.00 - 0.07 K/uL      Assessment & Plan:   Problem List Items Addressed This Visit       Cardiovascular and Mediastinum   Aortic atherosclerosis (HCC)    Ongoing. Lung screening CT, along with 3 vessel coronary artery disease.  Educated patient on this and recommend continue statin daily +  ASA 81 MG daily for prevention.  Continue cessation of smoking.  Focus on healthy diet and regular exercise.  Essential hypertension    Chronic, stable.  BP trending down on recheck.  Educated on DASH diet and recommend checking BP at home daily and documenting for provider visits. Continue Losartan daily, which is beneficial for proteinuria (47 January 2024) and continue 2.5 MG Amlodipine daily, educated her on these.  Avoid ACE due to underlying lung disease. Scheduled to see nephrology. LABS: CMP. Return in 6 months.        Respiratory   Centrilobular emphysema (HCC) - Primary    Chronic, ongoing.  FEV1 62% and FEV1/FVC 81% in February 2023.  Continue Anoro + continue Albuterol as needed, discussed at length with patient.  Continue lung screening annually, as past smoker with 30 year smoking history.  Refills as needed.  Spirometry next visit.      Sleep apnea    Does not use CPAP, not interested in repeat study at this time.  Plan on repeat study in future if worsening symptoms present.        Genitourinary    Stage 3a chronic kidney disease (HCC) (Chronic)    Ongoing with urine ALB 47 January 2024, continue ARB for kidney protection.  Recheck CMP today.  Avoid ACE due to COPD.  Nephrology referral placed.      Relevant Orders   Ambulatory referral to Nephrology     Other   Aromatase inhibitor use (Chronic)    Ongoing, continue collaboration with oncology team.      Invasive carcinoma of breast (HCC) (Chronic)    Ongoing.  Continue collaboration with oncology at this time.  Recent notes reviewed.      Depression    Chronic, stable on Wellbutrin.  Continue current regimen and adjust as needed.  Denies SI/HI.  Refills as needed.      History of smoking    Continue annual lung CT screening and monitor.      Hyperlipidemia    Chronic, stable.  Continue current medication regimen and adjust as needed.  Lipid panel today.      Relevant Orders   Comprehensive metabolic panel   Lipid Panel w/o Chol/HDL Ratio   Morbid obesity (HCC)    BMI 48.54.  Recommended eating smaller high protein, low fat meals more frequently and exercising 30 mins a day 5 times a week with a goal of 10-15lb weight loss in the next 3 months. Patient voiced their understanding and motivation to adhere to these recommendations.       Other Visit Diagnoses     Need for Td vaccine       Td vaccine today and educated patient.   Relevant Orders   Td vaccine greater than or equal to 7yo preservative free IM        Follow up plan: Return in about 6 months (around 10/15/2023) for Annual physical after 10/15/23.

## 2023-04-14 NOTE — Assessment & Plan Note (Signed)
 Continue annual lung CT screening and monitor.

## 2023-04-14 NOTE — Assessment & Plan Note (Signed)
Chronic, stable.  BP trending down on recheck.  Educated on DASH diet and recommend checking BP at home daily and documenting for provider visits. Continue Losartan daily, which is beneficial for proteinuria (47 January 2024) and continue 2.5 MG Amlodipine daily, educated her on these.  Avoid ACE due to underlying lung disease. Scheduled to see nephrology. LABS: CMP. Return in 6 months.

## 2023-04-14 NOTE — Assessment & Plan Note (Signed)
BMI 48.54.  Recommended eating smaller high protein, low fat meals more frequently and exercising 30 mins a day 5 times a week with a goal of 10-15lb weight loss in the next 3 months. Patient voiced their understanding and motivation to adhere to these recommendations.

## 2023-04-14 NOTE — Assessment & Plan Note (Signed)
Does not use CPAP, not interested in repeat study at this time.  Plan on repeat study in future if worsening symptoms present. 

## 2023-04-14 NOTE — Assessment & Plan Note (Signed)
 Ongoing. Lung screening CT, along with 3 vessel coronary artery disease.  Educated patient on this and recommend continue statin daily +  ASA 81 MG daily for prevention.  Continue cessation of smoking.  Focus on healthy diet and regular exercise.

## 2023-04-14 NOTE — Assessment & Plan Note (Signed)
 Ongoing, continue collaboration with oncology team.

## 2023-04-14 NOTE — Assessment & Plan Note (Signed)
Chronic, stable on Wellbutrin.  Continue current regimen and adjust as needed.  Denies SI/HI.  Refills as needed. 

## 2023-04-14 NOTE — Assessment & Plan Note (Signed)
Ongoing with urine ALB 47 January 2024, continue ARB for kidney protection.  Recheck CMP today.  Avoid ACE due to COPD.  Nephrology referral placed.

## 2023-04-14 NOTE — Assessment & Plan Note (Signed)
 Chronic, stable.  Continue current medication regimen and adjust as needed.  Lipid panel today. 

## 2023-04-15 LAB — COMPREHENSIVE METABOLIC PANEL
ALT: 37 IU/L — ABNORMAL HIGH (ref 0–32)
AST: 23 IU/L (ref 0–40)
Albumin: 4.2 g/dL (ref 3.9–4.9)
Alkaline Phosphatase: 80 IU/L (ref 44–121)
BUN/Creatinine Ratio: 13 (ref 12–28)
BUN: 17 mg/dL (ref 8–27)
Bilirubin Total: 0.5 mg/dL (ref 0.0–1.2)
CO2: 25 mmol/L (ref 20–29)
Calcium: 10.3 mg/dL (ref 8.7–10.3)
Chloride: 101 mmol/L (ref 96–106)
Creatinine, Ser: 1.31 mg/dL — ABNORMAL HIGH (ref 0.57–1.00)
Globulin, Total: 2.8 g/dL (ref 1.5–4.5)
Glucose: 92 mg/dL (ref 70–99)
Potassium: 3.9 mmol/L (ref 3.5–5.2)
Sodium: 142 mmol/L (ref 134–144)
Total Protein: 7 g/dL (ref 6.0–8.5)
eGFR: 45 mL/min/{1.73_m2} — ABNORMAL LOW (ref >59)

## 2023-04-15 LAB — LIPID PANEL W/O CHOL/HDL RATIO
Cholesterol, Total: 152 mg/dL (ref 100–199)
HDL: 35 mg/dL — ABNORMAL LOW (ref >39)
LDL Chol Calc (NIH): 69 mg/dL (ref 0–99)
Triglycerides: 299 mg/dL — ABNORMAL HIGH (ref 0–149)
VLDL Cholesterol Cal: 48 mg/dL — ABNORMAL HIGH (ref 5–40)

## 2023-04-15 NOTE — Progress Notes (Signed)
Contacted via MyChart   Good morning Dorothy Cunningham, your labs have returned: - Kidney function, eGFR and creatinine, continues to show stage 3a kidney disease -- stable.  Please ensure to schedule with kidney doctor when they call.  Liver function, AST and ALT, show ALT mild elevation -- no changes needed here. - Cholesterol levels show stable LDL (bad cholesterol), but triglycerides remain elevated.  Continue Atorvastatin for now, but we may change this next visit.  Any questions?  Come fasting to next labs:) Keep being amazing!!  Thank you for allowing me to participate in your care.  I appreciate you. Kindest regards, Korey Prashad

## 2023-04-23 ENCOUNTER — Encounter: Payer: Self-pay | Admitting: Nurse Practitioner

## 2023-04-23 MED ORDER — ANORO ELLIPTA 62.5-25 MCG/ACT IN AEPB: INHALATION_SPRAY | RESPIRATORY_TRACT | 5 refills | Status: DC

## 2023-05-17 ENCOUNTER — Ambulatory Visit: Payer: Self-pay

## 2023-05-17 NOTE — Telephone Encounter (Signed)
Message from Alessandra Bevels sent at 05/17/2023  8:02 AM EDT  Summary: Covid Advice   Pt is calling to report that she tested positive for COVID on Saturday with chills, and congestion.         Chief Complaint: covid positive Symptoms: head congestion, runny nose, cough, hot and cold flashes, headache, nasal congestion Frequency: Saturday Pertinent Negatives: Patient denies SOB, fever, nausea Disposition: [] ED /[] Urgent Care (no appt availability in office) / [x] Appointment(In office/virtual)/ []  [place] [facility]/ [] Home Care/ [] Refused Recommended Disposition /[] [place] [facility]/ []  Follow-up with PCP Additional Notes: Virtual appt made with PCP tomorrow  Reason for Disposition  [1] HIGH RISK patient (e.g., weak immune system, age > 64 years, obesity with BMI 30 or higher, pregnant, chronic lung disease or other chronic medical condition) AND [2] COVID symptoms (e.g., cough, fever)  (Exceptions: Already seen by PCP and no new or worsening symptoms.)  Answer Assessment - Initial Assessment Questions 1. COVID-19 DIAGNOSIS: "How do you know that you have COVID?" (e.g., positive lab test or self-test, diagnosed by doctor or NP/PA, symptoms after exposure).     Self test 2. COVID-19 EXPOSURE: "Was there any known exposure to COVID before the symptoms began?" CDC Definition of close contact: within 6 feet (2 meters) for a total of 15 minutes or more over a 24-hour period.      husband 3. ONSET: "When did the COVID-19 symptoms start?"      Sat am  4. WORST SYMPTOM: "What is your worst symptom?" (e.g., cough, fever, shortness of breath, muscle aches)     Runny and stopped nose. Headaches, body aches hot and cold flashes 5. COUGH: "Do you have a cough?" If Yes, ask: "How bad is the cough?"       Yes- occasion dry cough, fatigue  6. FEVER: "Do you have a fever?" If Yes, ask: "What is your temperature, how was it measured, and when did it start?"     no 7. RESPIRATORY STATUS: "Describe  your breathing?" (e.g., normal; shortness of breath, wheezing, unable to speak)      Hoarse- normal  8. BETTER-SAME-WORSE: "Are you getting better, staying the same or getting worse compared to yesterday?"  If getting worse, ask, "In what way?"     better 9. OTHER SYMPTOMS: "Do you have any other symptoms?"  (e.g., chills, fatigue, headache, loss of smell or taste, muscle pain, sore throat)     See above 10. HIGH RISK DISEASE: "Do you have any chronic medical problems?" (e.g., asthma, heart or lung disease, weak immune system, obesity, etc.)       COPD  Protocols used: Coronavirus (COVID-19) Diagnosed or Suspected-A-AH

## 2023-05-18 ENCOUNTER — Telehealth (INDEPENDENT_AMBULATORY_CARE_PROVIDER_SITE_OTHER): Payer: Federal, State, Local not specified - PPO | Admitting: Nurse Practitioner

## 2023-05-18 ENCOUNTER — Ambulatory Visit: Payer: Federal, State, Local not specified - PPO | Admitting: Nurse Practitioner

## 2023-05-18 ENCOUNTER — Encounter: Payer: Self-pay | Admitting: Nurse Practitioner

## 2023-05-18 DIAGNOSIS — U071 COVID-19: Secondary | ICD-10-CM | POA: Insufficient documentation

## 2023-05-18 MED ORDER — NIRMATRELVIR/RITONAVIR (PAXLOVID) TABLET (RENAL DOSING): 2.0000 | ORAL_TABLET | Freq: Two times a day (BID) | ORAL | 0 refills | Status: AC

## 2023-05-18 NOTE — Assessment & Plan Note (Signed)
Acute with positive testing on 05/16/2023, 4 days. Husband had Covid and was treated with Paxlovid.  She wishes to be treated with the same and is higher risk.  Will send in renal dosed Paxlovid, recent eGFR 45.  Educated her on medication use and side effects.  Hold Atorvastatin and Garlic while taking this, she is aware.  Recommend self quarantine for 5 days and then 5 days of masking.  Recommend: - Increased rest - Increasing Fluids - Acetaminophen as needed for fever/pain.  - Salt water gargling, chloraseptic spray and throat lozenges - OTC Coricidin - Mucinex.  - Humidifying the air.

## 2023-05-18 NOTE — Patient Instructions (Signed)

## 2023-05-18 NOTE — Progress Notes (Signed)
LMP  (LMP Unknown)    Subjective:    Patient ID: Dorothy Cunningham, female    DOB: [birth year], 62 y.o.   MRN: [id number]  HPI: Dorothy Cunningham is a 62 y.o. female  Chief Complaint  Patient presents with   Covid Positive    Pt states she tested positive for Covid on Saturday, symptoms started this day as well. Patient states she has been having chills, congestion, fatigue, cough, and sore throat. Patient states she has been having some diarrhea today as well.    Virtual Visit via Video Note  I connected with Dorothy Cunningham on 05/18/23 at  4:20 PM EDT by a video enabled telemedicine application and verified that I am speaking with the correct person using two identifiers. We were able to get on video briefly and then lost connection, patient was unable to get back connection and we had to transition to telephone visit to complete.  Location: Patient: home Provider: work   I discussed the limitations of evaluation and management by telemedicine and the availability of in person appointments. The patient expressed understanding and agreed to proceed.  I discussed the assessment and treatment plan with the patient. The patient was provided an opportunity to ask questions and all were answered. The patient agreed with the plan and demonstrated an understanding of the instructions.   The patient was advised to call back or seek an in-person evaluation if the symptoms worsen or if the condition fails to improve as anticipated.  I provided 21 minutes of non-face-to-face time during this encounter.   Marjie Skiff, NP   COVID POSITIVE Tested positive on Saturday and symptoms started that day.  Husband had Covid.  Has been vaccinated. Fever: chills, no fever Cough: yes Shortness of breath: no Wheezing: no Chest pain: no Chest tightness: no Chest congestion: no Nasal congestion: yes Runny nose: yes Post nasal drip: yes Sneezing: no Sore throat: yes Swollen  glands: no Sinus pressure: yes Headache: yes Face pain: no Toothache: no Ear pain: no Ear pressure: no Eyes red/itching:no Eye drainage/crusting: no  Vomiting: no Rash: no Fatigue: yes Sick contacts: yes Strep contacts: no  Context: fluctuating Recurrent sinusitis: no Relief with OTC cold/cough medications: yes  Treatments attempted: Nyquil, Coricidin    Relevant past medical, surgical, family and social history reviewed and updated as indicated. Interim medical history since our last visit reviewed. Allergies and medications reviewed and updated.  Review of Systems  Constitutional:  Positive for chills and fatigue. Negative for activity change, appetite change and fever.  HENT:  Positive for congestion, postnasal drip and rhinorrhea. Negative for ear discharge, ear pain, facial swelling, sinus pressure, sinus pain, sneezing, sore throat and voice change.   Respiratory:  Positive for cough. Negative for chest tightness, shortness of breath and wheezing.   Cardiovascular:  Negative for chest pain, palpitations and leg swelling.  Gastrointestinal: Negative.   Endocrine: Negative.   Musculoskeletal:  Positive for myalgias.  Neurological:  Positive for headaches. Negative for dizziness and numbness.  Psychiatric/Behavioral: Negative.     Per HPI unless specifically indicated above     Objective:    LMP  (LMP Unknown)   Wt Readings from Last 3 Encounters:  04/14/23 254 lb 12.8 oz (115.6 kg)  03/19/23 254 lb 1.6 oz (115.3 kg)  11/25/22 255 lb 6.4 oz (115.8 kg)    Physical Exam  Unable to assess as patient lost connection and had to complete via telephone.  Results for orders placed  or performed in visit on 04/14/23  Comprehensive metabolic panel  Result Value Ref Range   Glucose 92 70 - 99 mg/dL   BUN 17 8 - 27 mg/dL   Creatinine, Ser 1.61 (H) 0.57 - 1.00 mg/dL   eGFR 45 (L) >09 UE/AVW/0.98   BUN/Creatinine Ratio 13 12 - 28   Sodium 142 134 - 144 mmol/L   Potassium  3.9 3.5 - 5.2 mmol/L   Chloride 101 96 - 106 mmol/L   CO2 25 20 - 29 mmol/L   Calcium 10.3 8.7 - 10.3 mg/dL   Total Protein 7.0 6.0 - 8.5 g/dL   Albumin 4.2 3.9 - 4.9 g/dL   Globulin, Total 2.8 1.5 - 4.5 g/dL   Bilirubin Total 0.5 0.0 - 1.2 mg/dL   Alkaline Phosphatase 80 44 - 121 IU/L   AST 23 0 - 40 IU/L   ALT 37 (H) 0 - 32 IU/L  Lipid Panel w/o Chol/HDL Ratio  Result Value Ref Range   Cholesterol, Total 152 100 - 199 mg/dL   Triglycerides 119 (H) 0 - 149 mg/dL   HDL 35 (L) >14 mg/dL   VLDL Cholesterol Cal 48 (H) 5 - 40 mg/dL   LDL Chol Calc (NIH) 69 0 - 99 mg/dL      Assessment & Plan:   Problem List Items Addressed This Visit       Other   COVID-19 virus RNA test result positive at limit of detection - Primary    Acute with positive testing on 05/16/2023, 4 days. Husband had Covid and was treated with Paxlovid.  She wishes to be treated with the same and is higher risk.  Will send in renal dosed Paxlovid, recent eGFR 45.  Educated her on medication use and side effects.  Hold Atorvastatin and Garlic while taking this, she is aware.  Recommend self quarantine for 5 days and then 5 days of masking.  Recommend: - Increased rest - Increasing Fluids - Acetaminophen as needed for fever/pain.  - Salt water gargling, chloraseptic spray and throat lozenges - OTC Coricidin - Mucinex.  - Humidifying the air.         Follow up plan: Return if symptoms worsen or fail to improve.

## 2023-05-28 ENCOUNTER — Other Ambulatory Visit: Payer: Self-pay | Admitting: Nurse Practitioner

## 2023-05-28 NOTE — Telephone Encounter (Signed)
Requested Prescriptions  Pending Prescriptions Disp Refills   fluticasone (FLONASE) 50 MCG/ACT nasal spray [Pharmacy Med Name: FLUTICASONE NASAL SP (120) RX] 48 g 0    Sig: SHAKE LIQUID AND USE 2 SPRAYS IN EACH NOSTRIL DAILY     Ear, Nose, and Throat: Nasal Preparations - Corticosteroids Passed - 05/28/2023  9:03 AM      Passed - Valid encounter within last 12 months    Recent Outpatient Visits           1 week ago COVID-19 virus RNA test result positive at limit of detection   [place] [facility] [place], Dorie Rank, NP   1 month ago Centrilobular emphysema (HCC)   [place] [facility] [place], Corrie Dandy T, NP   7 months ago Invasive carcinoma of breast (HCC)   [place] [facility] [place], Corrie Dandy T, NP   9 months ago Stage 3a chronic kidney disease (HCC)   [place] [facility] [place], Corrie Dandy T, NP   10 months ago Stage 3a chronic kidney disease (HCC)   [place] [facility] [place], Dorie Rank, NP       Future Appointments             In 4 months [place], Dorie Rank, NP  [facility]

## 2023-06-02 ENCOUNTER — Ambulatory Visit
Admission: RE | Admit: 2023-06-02 | Discharge: 2023-06-02 | Disposition: A | Discharge status: Home / Self Care | Payer: Federal, State, Local not specified - PPO | Source: Ambulatory Visit | Attending: Radiation Oncology | Admitting: Radiation Oncology

## 2023-06-02 ENCOUNTER — Encounter: Payer: Self-pay | Admitting: Radiation Oncology

## 2023-06-02 VITALS — BP 162/95 | HR 97 | Temp 97.3°F | Resp 16 | Wt 252.0 lb

## 2023-06-02 DIAGNOSIS — Z170 Estrogen receptor positive status [ER+]: Secondary | ICD-10-CM | POA: Insufficient documentation

## 2023-06-02 DIAGNOSIS — C50512 Malignant neoplasm of lower-outer quadrant of left female breast: Secondary | ICD-10-CM | POA: Diagnosis not present

## 2023-06-02 DIAGNOSIS — Z79811 Long term (current) use of aromatase inhibitors: Secondary | ICD-10-CM | POA: Insufficient documentation

## 2023-06-02 DIAGNOSIS — Z923 Personal history of irradiation: Secondary | ICD-10-CM | POA: Diagnosis not present

## 2023-06-02 DIAGNOSIS — C50919 Malignant neoplasm of unspecified site of unspecified female breast: Secondary | ICD-10-CM

## 2023-06-02 NOTE — Progress Notes (Signed)
Survivorship Care Plan visit completed.  Treatment summary reviewed and given to patient.  ASCO answers booklet reviewed and given to patient.  CARE program and Cancer Transitions discussed with patient along with other resources cancer center offers to patients and caregivers.  Patient verbalized understanding.    

## 2023-06-02 NOTE — Progress Notes (Signed)
Radiation Oncology Follow up Note  Name: Dorothy Cunningham   Date:   06/02/2023 MRN:  [id number] DOB: Sep 12, 1961    This 62 y.o. female presents to the clinic today for 1 year follow-up status post whole breast radiation to her left breast for stage IIa (T2 N0 M0) grade 3 ER/PR positive invasive mammary carcinoma.  REFERRING PROVIDER: Marjie Skiff, NP  HPI: Patient is a 62 year old female now out 1 year having completed whole breast radiation to her left breast for stage IIa ER/PR positive invasive mammary carcinoma status post wide local excision.  Seen today in routine follow-up she is doing well.  Specifically denies breast tenderness cough or bone pain..  She had mammograms back in February which I have reviewed were BI-RADS 2 benign.  She is currently on Arimidex tolerating it well without side effect.  COMPLICATIONS OF TREATMENT: none  FOLLOW UP COMPLIANCE: keeps appointments   PHYSICAL EXAM:  BP (!) 162/95   Pulse 97   Temp (!) 97.3 F (36.3 C) (Tympanic)   Resp 16   Wt 252 lb (114.3 kg)   LMP  (LMP Unknown)   BMI 48.01 kg/m  Lungs are clear to A&P cardiac examination essentially unremarkable with regular rate and rhythm. No dominant mass or nodularity is noted in either breast in 2 positions examined. Incision is well-healed. No axillary or supraclavicular adenopathy is appreciated. Cosmetic result is excellent.  Well-developed well-nourished patient in NAD. HEENT reveals PERLA, EOMI, discs not visualized.  Oral cavity is clear. No oral mucosal lesions are identified. Neck is clear without evidence of cervical or supraclavicular adenopathy. Lungs are clear to A&P. Cardiac examination is essentially unremarkable with regular rate and rhythm without murmur rub or thrill. Abdomen is benign with no organomegaly or masses noted. Motor sensory and DTR levels are equal and symmetric in the upper and lower extremities. Cranial nerves II through XII are grossly intact. Proprioception  is intact. No peripheral adenopathy or edema is identified. No motor or sensory levels are noted. Crude visual fields are within normal range.  RADIOLOGY RESULTS: Mammograms reviewed compatible with above-stated findings  PLAN: The present time patient is now out 1 year with no evidence of disease.  And pleased with her overall progress.  I have asked to see her back in 1 year for follow-up.  She continues on Arimidex without side effect.  Patient knows to call with any concerns.  I would like to take this opportunity to thank you for allowing me to participate in the care of your patient.Carmina Miller, MD

## 2023-06-12 ENCOUNTER — Other Ambulatory Visit: Payer: Self-pay | Admitting: Nurse Practitioner

## 2023-06-14 NOTE — Telephone Encounter (Signed)
Requested Prescriptions  Pending Prescriptions Disp Refills   amLODipine (NORVASC) 2.5 MG tablet [Pharmacy Med Name: AMLODIPINE BESYLATE 2.5MG  TABLETS] 90 tablet 0    Sig: TAKE 1 TABLET(2.5 MG) BY MOUTH DAILY     Cardiovascular: Calcium Channel Blockers 2 Failed - 06/12/2023  9:58 AM      Failed - Last BP in normal range    BP Readings from Last 1 Encounters:  06/02/23 (!) 162/95         Passed - Last Heart Rate in normal range    Pulse Readings from Last 1 Encounters:  06/02/23 97         Passed - Valid encounter within last 6 months    Recent Outpatient Visits           3 weeks ago COVID-19 virus RNA test result positive at limit of detection   [place] [facility] [place], Corrie Dandy T, NP   2 months ago Centrilobular emphysema (HCC)   [place] [facility] [place], Corrie Dandy T, NP   8 months ago Invasive carcinoma of breast (HCC)   [place] [facility] [place], Corrie Dandy T, NP   10 months ago Stage 3a chronic kidney disease (HCC)   [place] [facility] [place], Corrie Dandy T, NP   11 months ago Stage 3a chronic kidney disease (HCC)   [place] [facility] [place], Dorie Rank, NP       Future Appointments             In 4 months [place], Dorie Rank, NP [place] [facility]             fluticasone (FLONASE) 50 MCG/ACT nasal spray [Pharmacy Med Name: FLUTICASONE NASAL SP (120) RX] 48 g 0    Sig: SHAKE LIQUID AND USE 2 SPRAYS IN EACH NOSTRIL DAILY     Ear, Nose, and Throat: Nasal Preparations - Corticosteroids Passed - 06/12/2023  9:58 AM      Passed - Valid encounter within last 12 months    Recent Outpatient Visits           3 weeks ago COVID-19 virus RNA test result positive at limit of detection   [place] [facility] [place], Corrie Dandy T, NP   2 months ago Centrilobular emphysema (HCC)   [place] [facility] [place], Corrie Dandy T, NP   8 months ago  Invasive carcinoma of breast (HCC)   [place] [facility] [place], Corrie Dandy T, NP   10 months ago Stage 3a chronic kidney disease (HCC)   [place] [facility] [place], Jolene T, NP   11 months ago Stage 3a chronic kidney disease (HCC)   [place] [facility] [place], Dorie Rank, NP       Future Appointments             In 4 months Cannady, Dorie Rank, NP [place] [facility], [facility]

## 2023-06-16 ENCOUNTER — Other Ambulatory Visit: Payer: Self-pay | Admitting: Nephrology

## 2023-06-16 ENCOUNTER — Encounter: Payer: Self-pay | Admitting: Nephrology

## 2023-06-16 DIAGNOSIS — I10 Essential (primary) hypertension: Secondary | ICD-10-CM | POA: Diagnosis not present

## 2023-06-16 DIAGNOSIS — R8290 Unspecified abnormal findings in urine: Secondary | ICD-10-CM | POA: Diagnosis not present

## 2023-06-16 DIAGNOSIS — E668 Other obesity: Secondary | ICD-10-CM | POA: Diagnosis not present

## 2023-06-16 DIAGNOSIS — E785 Hyperlipidemia, unspecified: Secondary | ICD-10-CM | POA: Diagnosis not present

## 2023-06-16 DIAGNOSIS — N1831 Chronic kidney disease, stage 3a: Secondary | ICD-10-CM

## 2023-06-21 ENCOUNTER — Ambulatory Visit
Admission: RE | Admit: 2023-06-21 | Discharge: 2023-06-21 | Disposition: A | Discharge status: Home / Self Care | Payer: Federal, State, Local not specified - PPO | Source: Ambulatory Visit | Attending: Nurse Practitioner | Admitting: Nurse Practitioner

## 2023-06-21 DIAGNOSIS — F17210 Nicotine dependence, cigarettes, uncomplicated: Secondary | ICD-10-CM | POA: Diagnosis not present

## 2023-06-21 DIAGNOSIS — Z87891 Personal history of nicotine dependence: Secondary | ICD-10-CM | POA: Insufficient documentation

## 2023-06-21 DIAGNOSIS — Z122 Encounter for screening for malignant neoplasm of respiratory organs: Secondary | ICD-10-CM | POA: Diagnosis not present

## 2023-07-02 ENCOUNTER — Other Ambulatory Visit: Payer: Self-pay

## 2023-07-02 DIAGNOSIS — Z122 Encounter for screening for malignant neoplasm of respiratory organs: Secondary | ICD-10-CM

## 2023-07-02 DIAGNOSIS — Z87891 Personal history of nicotine dependence: Secondary | ICD-10-CM

## 2023-07-07 ENCOUNTER — Ambulatory Visit
Admission: RE | Admit: 2023-07-07 | Discharge: 2023-07-07 | Disposition: A | Discharge status: Home / Self Care | Payer: Federal, State, Local not specified - PPO | Source: Ambulatory Visit | Attending: Nephrology | Admitting: Nephrology

## 2023-07-07 DIAGNOSIS — R8290 Unspecified abnormal findings in urine: Secondary | ICD-10-CM | POA: Insufficient documentation

## 2023-07-07 DIAGNOSIS — N1831 Chronic kidney disease, stage 3a: Secondary | ICD-10-CM | POA: Diagnosis not present

## 2023-07-07 DIAGNOSIS — I10 Essential (primary) hypertension: Secondary | ICD-10-CM | POA: Insufficient documentation

## 2023-07-07 DIAGNOSIS — E785 Hyperlipidemia, unspecified: Secondary | ICD-10-CM | POA: Diagnosis not present

## 2023-07-07 DIAGNOSIS — N261 Atrophy of kidney (terminal): Secondary | ICD-10-CM | POA: Diagnosis not present

## 2023-07-12 ENCOUNTER — Other Ambulatory Visit: Payer: Self-pay | Admitting: Nurse Practitioner

## 2023-07-13 NOTE — Telephone Encounter (Signed)
Requested Prescriptions  Pending Prescriptions Disp Refills   losartan (COZAAR) 100 MG tablet [Pharmacy Med Name: LOSARTAN 100MG  TABLETS] 90 tablet 0    Sig: TAKE 1 TABLET(100 MG) BY MOUTH DAILY     Cardiovascular:  Angiotensin Receptor Blockers Failed - 07/12/2023  7:47 AM      Failed - Cr in normal range and within 180 days    Creatinine  Date Value Ref Range Status  03/19/2023 1.15 (H) 0.44 - 1.00 mg/dL Final   Creatinine, Ser  Date Value Ref Range Status  04/14/2023 1.31 (H) 0.57 - 1.00 mg/dL Final         Failed - Last BP in normal range    BP Readings from Last 1 Encounters:  06/02/23 (!) 162/95         Passed - K in normal range and within 180 days    Potassium  Date Value Ref Range Status  04/14/2023 3.9 3.5 - 5.2 mmol/L Final         Passed - Patient is not pregnant      Passed - Valid encounter within last 6 months    Recent Outpatient Visits           1 month ago COVID-19 virus RNA test result positive at limit of detection   [place] [facility] [place], Dorie Rank, NP   3 months ago Centrilobular emphysema (HCC)   [place] [facility] [place], Corrie Dandy T, NP   9 months ago Invasive carcinoma of breast (HCC)   [place] [facility] [place], Corrie Dandy T, NP   11 months ago Stage 3a chronic kidney disease (HCC)   [place] [facility] [place], Corrie Dandy T, NP   1 year ago Stage 3a chronic kidney disease (HCC)   [place] [facility] [place], Dorie Rank, NP       Future Appointments             In 3 months [place], Dorie Rank, NP [place] [facility], [facility]

## 2023-07-14 IMAGING — MG DIGITAL DIAGNOSTIC BILAT W/ TOMO W/ CAD
8 of 15 series · 8 of 40 positions shown · non-contrast
Comparison: Prior mammogram dated April 28, 2011

CLINICAL DATA: Palpable area in the LEFT breast.

EXAM:
DIGITAL DIAGNOSTIC BILATERAL MAMMOGRAM WITH TOMOSYNTHESIS AND CAD;
ULTRASOUND LEFT BREAST LIMITED
TECHNIQUE: Bilateral digital diagnostic mammography and breast tomosynthesis
was performed. The images were evaluated with computer-aided
detection.; Targeted ultrasound examination of the left breast was
performed.

[L CC]
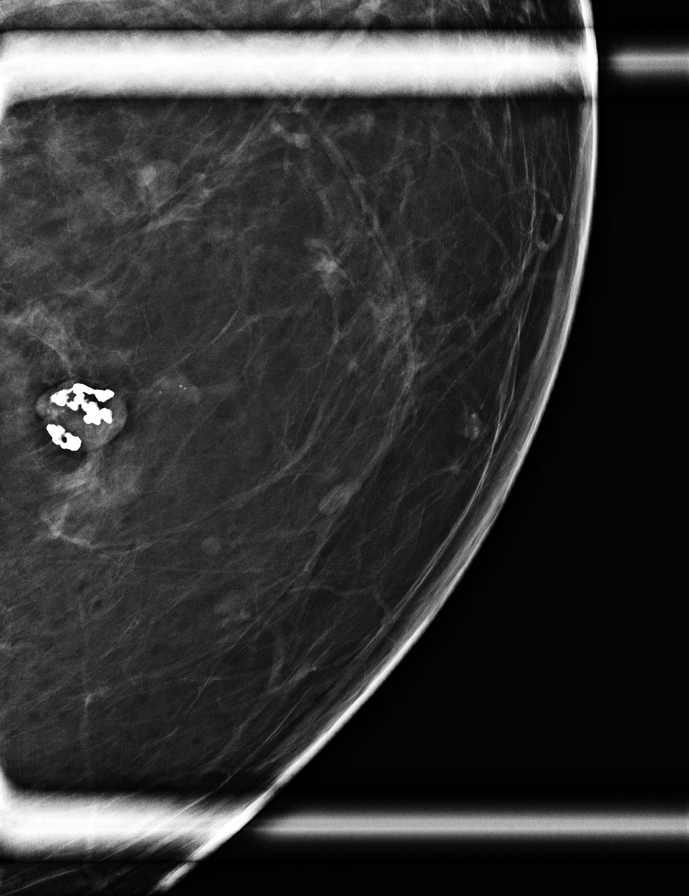

[L ML]
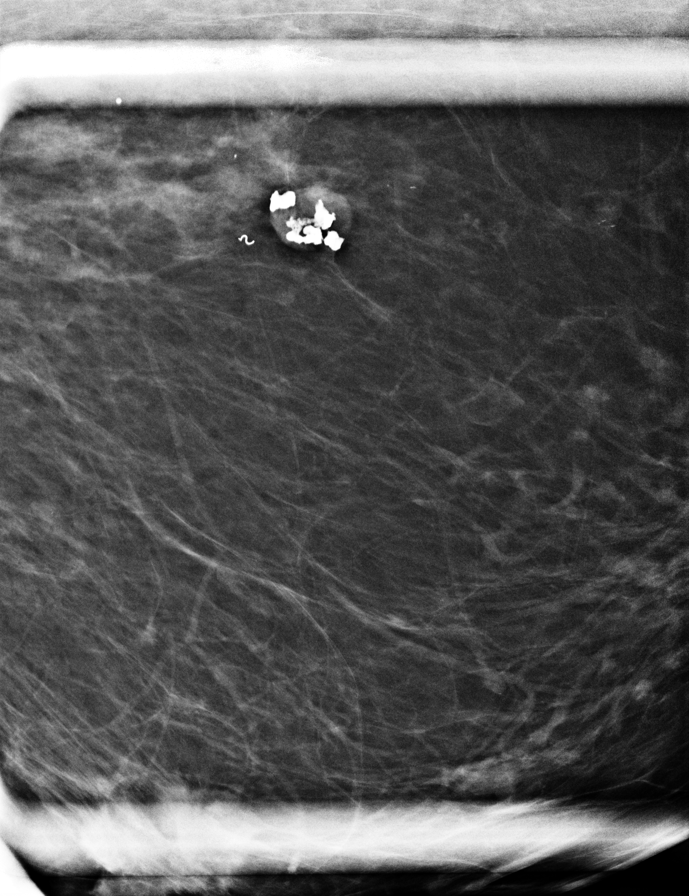

[L MLO synth-2D (1 of 2)]
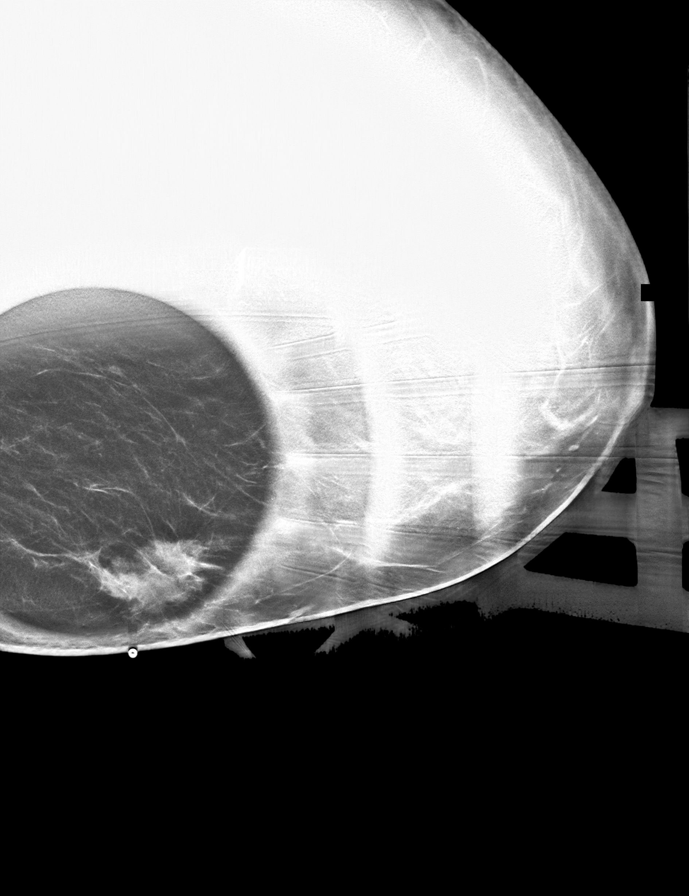

[L MLO synth-2D (2 of 2)]
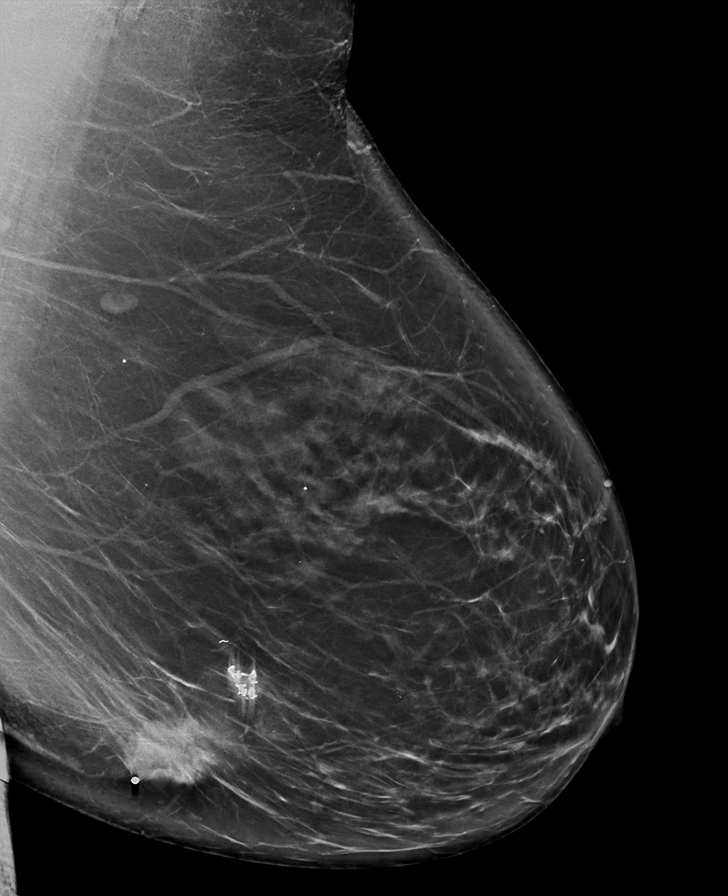

[R CC synth-2D]
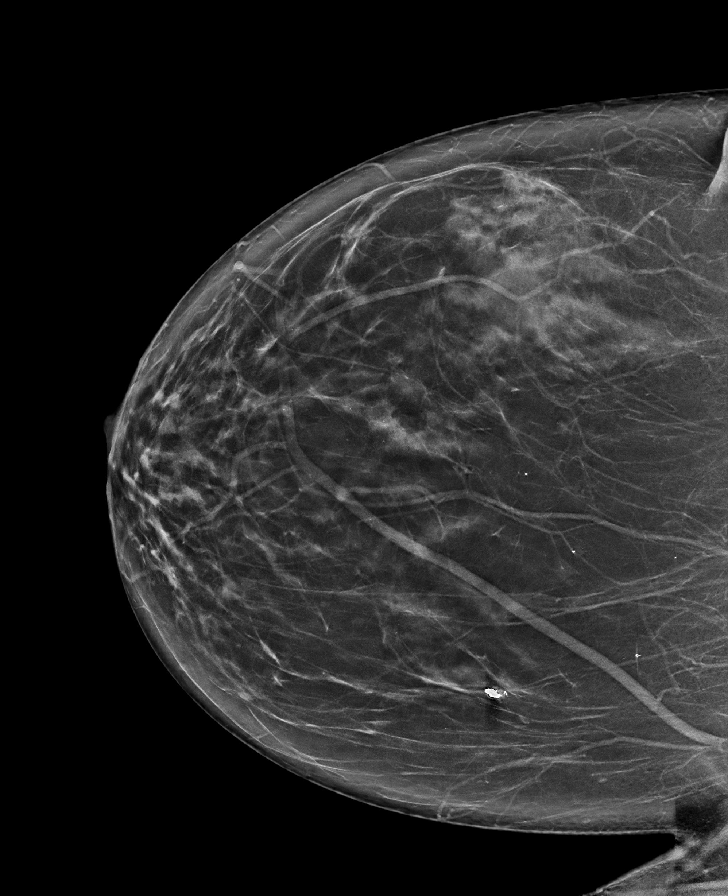

[L CC synth-2D]
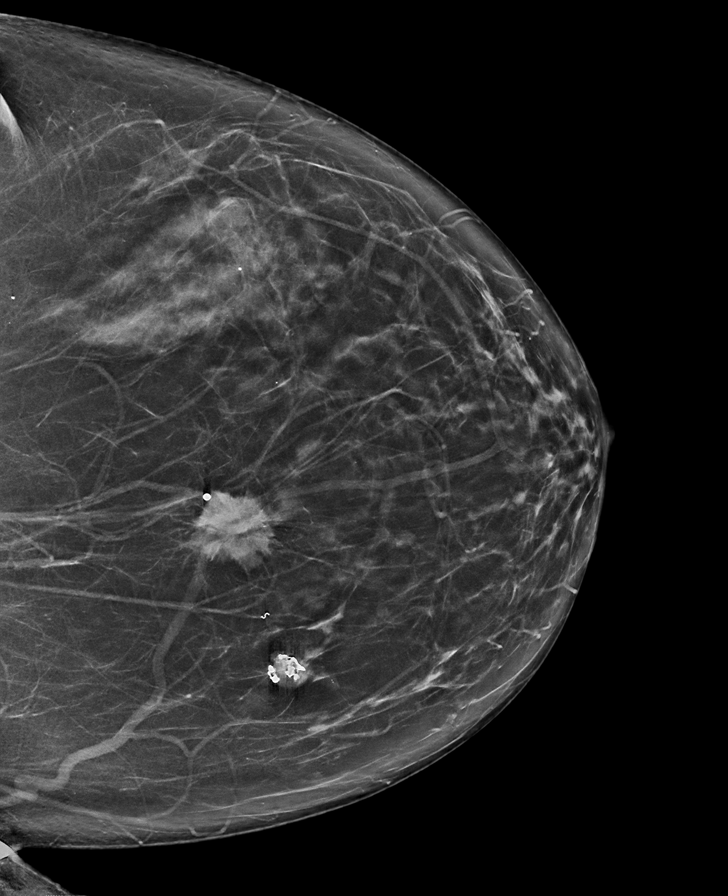

[R MLO synth-2D (1 of 2)]
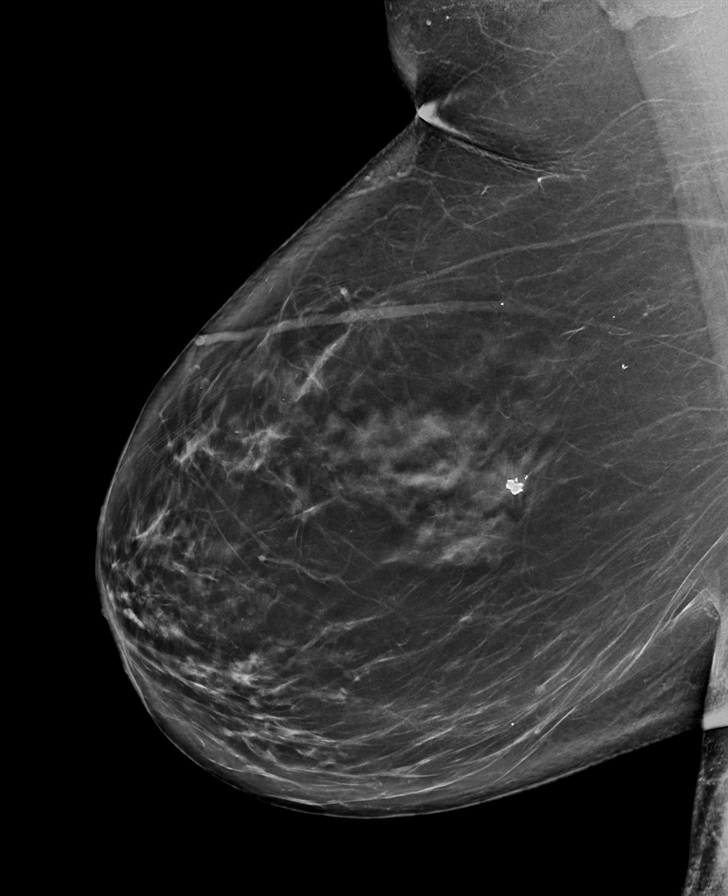

[R MLO synth-2D (2 of 2)]
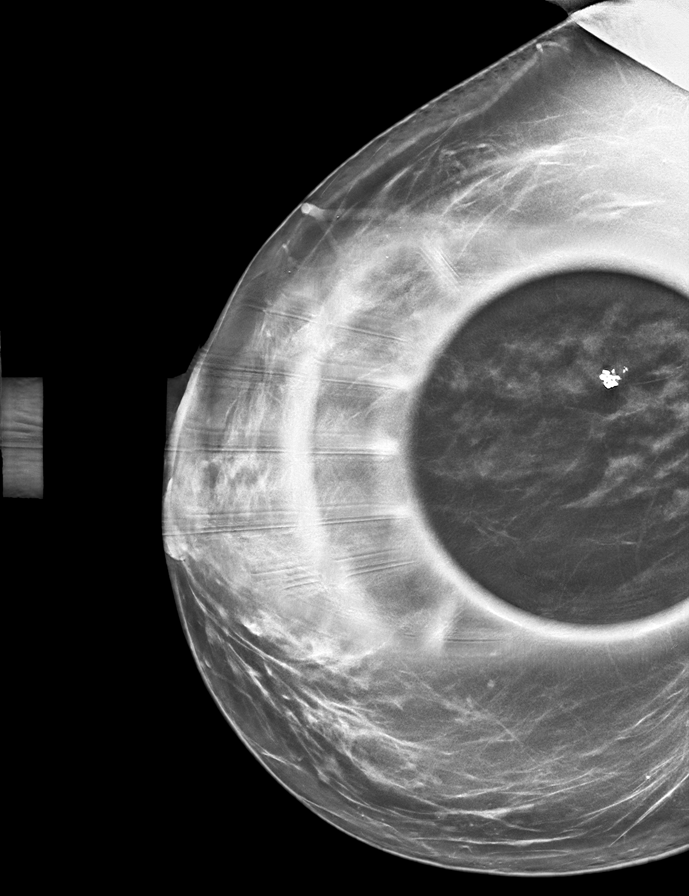

[8 of 40 positions shown; findings below may reference images not displayed]

ACR Breast Density Category b: There are scattered areas of
fibroglandular density.
FINDINGS: Spot compression tomosynthesis views of the site of palpable concern
in the LEFT breast demonstrate an irregular spiculated mass with
associated skin retraction. It measures approximately 21 mm. Spot
magnification views were performed of the LEFT breast. There are
benign dystrophic calcifications in the LEFT lower inner breast at a
site of stable LEFT breast mass consistent with a fibroadenoma.
There is a 3 mm group of layering calcifications in the LEFT lower
inner breast at anterior depth consistent with benign milk of
calcium.

A questioned asymmetry in the RIGHT breast resolved with additional
views, consistent with overlapping tissue. No suspicious mass,
distortion, or microcalcifications are identified to suggest
presence of malignancy in the RIGHT breast.

On physical exam, there is a hard mass in the LEFT lower breast with
associated skin dimpling.

Targeted ultrasound was performed of the site of palpable concern.
At 5 o'clock 9 cm from the nipple, there is an irregular mass with
spiculated margins and an echogenic rind. It measures 2.1 x 1.5 x
1.8 cm. Spiculations closely approximate the dermis.

Targeted ultrasound was performed of the LEFT axilla. No suspicious
axillary lymph nodes are seen.
IMPRESSION: 1. A 21 mm mass in the LEFT breast at 5 o'clock 9 cm from the nipple
is concerning for malignancy. Recommend ultrasound-guided biopsy for
definitive characterization.
2. No suspicious LEFT axillary adenopathy.
3. No mammographic evidence of malignancy in the RIGHT breast.

RECOMMENDATION:
LEFT breast ultrasound-guided biopsy x1

I have discussed the findings and recommendations with the patient.
The biopsy procedure was discussed with the patient and questions
were answered. Patient expressed their understanding of the biopsy
recommendation. Patient will be scheduled for biopsy at her earliest
convenience by the schedulers. Ordering provider will be notified.
If applicable, a reminder letter will be sent to the patient
regarding the next appointment.

BI-RADS CATEGORY  5: Highly suggestive of malignancy.

## 2023-07-16 ENCOUNTER — Encounter: Payer: Self-pay | Admitting: Nurse Practitioner

## 2023-07-16 MED ORDER — AMLODIPINE BESYLATE 2.5 MG PO TABS: 2.5000 mg | ORAL_TABLET | Freq: Every day | ORAL | 4 refills | Status: DC

## 2023-08-10 DIAGNOSIS — N3289 Other specified disorders of bladder: Secondary | ICD-10-CM | POA: Insufficient documentation

## 2023-09-01 ENCOUNTER — Encounter: Payer: Self-pay | Admitting: Urology

## 2023-09-01 ENCOUNTER — Other Ambulatory Visit: Payer: Self-pay

## 2023-09-01 ENCOUNTER — Other Ambulatory Visit
Admission: RE | Admit: 2023-09-01 | Discharge: 2023-09-01 | Disposition: A | Discharge status: Home / Self Care | Payer: Federal, State, Local not specified - PPO | Attending: Urology | Admitting: Urology

## 2023-09-01 ENCOUNTER — Telehealth: Payer: Self-pay

## 2023-09-01 ENCOUNTER — Ambulatory Visit: Payer: Federal, State, Local not specified - PPO | Admitting: Urology

## 2023-09-01 VITALS — BP 190/98 | HR 101 | Ht 61.0 in | Wt 248.0 lb

## 2023-09-01 DIAGNOSIS — N261 Atrophy of kidney (terminal): Secondary | ICD-10-CM

## 2023-09-01 DIAGNOSIS — N3289 Other specified disorders of bladder: Secondary | ICD-10-CM | POA: Diagnosis not present

## 2023-09-01 DIAGNOSIS — B379 Candidiasis, unspecified: Secondary | ICD-10-CM

## 2023-09-01 LAB — URINALYSIS, COMPLETE (UACMP) WITH MICROSCOPIC
Bilirubin Urine: NEGATIVE
Glucose, UA: NEGATIVE mg/dL
Ketones, ur: NEGATIVE mg/dL
Nitrite: NEGATIVE
Protein, ur: NEGATIVE mg/dL
Specific Gravity, Urine: 1.02 (ref 1.005–1.030)
pH: 5.5 (ref 5.0–8.0)

## 2023-09-01 MED ORDER — FLUCONAZOLE 150 MG PO TABS: 150.0000 mg | ORAL_TABLET | Freq: Once | ORAL | 0 refills | Status: AC

## 2023-09-01 NOTE — Progress Notes (Signed)
09/01/23 12:33 PM   Dorothy Cunningham Dorothy Cunningham [birth year] [id number]  CC: Possible bladder mass  HPI: 62 year old female with a number of other medical issues including morbid obesity with BMI of 47, history of breast cancer, and COPD referred for possible bladder mass seen on renal/bladder ultrasound.  There is no cross-sectional imaging to review.  She denies any gross hematuria, has mild urinary symptoms of itching and frequency.  She reports having a bladder tack surgery at the time of hysterectomy over 20 years ago.  She is a former smoker with a 15-pack-year history, quit 10 years ago.   PMH: Past Medical History:  Diagnosis Date   Allergic rhinitis    Anxiety    Breast cancer, left breast (HCC) 10/2021   Chronic kidney disease, stage 3a (HCC)    COPD (chronic obstructive pulmonary disease) (HCC)    History of epilepsy    as a teenager   Hyperlipidemia    Hypertension    Morbid obesity with BMI of 45.0-49.9, adult (HCC)    Sleep apnea     Surgical History: Past Surgical History:  Procedure Laterality Date   Bladder Tuck  1995   BREAST BIOPSY Left 2014   benign   BREAST BIOPSY Left 11/06/2021   u/s bx  5 o'clock, VENUS clip- INVASIVE MAMMARY CARCINOMA,   BREAST LUMPECTOMY WITH SENTINEL LYMPH NODE BIOPSY Left 01/26/2022   Procedure: BREAST LUMPECTOMY WITH SENTINEL LYMPH NODE BX;  Surgeon: Earline Mayotte, MD;  Location: [facility] ORS;  Service: General;  Laterality: Left;   CHOLECYSTECTOMY  08/26/2022   COLONOSCOPY     PARTIAL HYSTERECTOMY  1995   due to heavy periods     Family History: Family History  Problem Relation Age of Onset   Thyroid disease Mother    Asthma Mother    Osteoarthritis Father    CAD Father    Diabetes Maternal Uncle    Diabetes Maternal Grandfather    Tuberculosis Maternal Grandfather    Diabetes Paternal Grandmother    Heart attack Paternal Grandfather    Breast cancer Other        great grandmother    Social History:  reports that  she quit smoking about 8 years ago. Her smoking use included cigarettes. She started smoking about 48 years ago. She has a 30 pack-year smoking history. She has never used smokeless tobacco. She reports that she does not drink alcohol and does not use drugs.  Physical Exam: BP (!) 190/98   Pulse (!) 101   Ht 5\' 1"  (1.549 m)   Wt 248 lb (112.5 kg)   LMP  (LMP Unknown)   BMI 46.86 kg/m    Constitutional:  Alert and oriented, No acute distress. Cardiovascular: No clubbing, cyanosis, or edema. Respiratory: Normal respiratory effort, no increased work of breathing. GI: Abdomen is soft, nontender, nondistended, no abdominal masses   Laboratory Data: Reviewed  Pertinent Imaging: I have personally viewed and interpreted the bladder ultrasound, possible lateral wall bladder mass  Assessment & Plan:   62 year old female with possible bladder mass seen on ultrasound, I recommended CT urogram and cystoscopy for further evaluation.  Risk and benefits were discussed.  We also reviewed possible need for TURBT pending CT and cystoscopy findings, as well as briefly reviewed the different pathways for treatment for nonmuscle invasive versus muscle invasive bladder cancer.  Follow-up for CT and cystoscopy Call with urinalysis results  Legrand Rams, MD 09/01/2023  [facility] [address], [address] [place], [place]  [address] [phone]   

## 2023-09-01 NOTE — Telephone Encounter (Signed)
-----   Message from Sondra Come sent at 09/01/2023 12:56 PM EST ----- Molli Knock to send in fluconazole for yeast infection, thank you  Legrand Rams, MD 09/01/2023

## 2023-09-01 NOTE — Patient Instructions (Signed)

## 2023-09-17 ENCOUNTER — Inpatient Hospital Stay (HOSPITAL_BASED_OUTPATIENT_CLINIC_OR_DEPARTMENT_OTHER): Payer: Federal, State, Local not specified - PPO | Admitting: Oncology

## 2023-09-17 ENCOUNTER — Encounter: Payer: Self-pay | Admitting: Oncology

## 2023-09-17 ENCOUNTER — Inpatient Hospital Stay: Payer: Federal, State, Local not specified - PPO | Attending: Oncology

## 2023-09-17 VITALS — BP 152/79 | HR 86 | Temp 98.8°F

## 2023-09-17 DIAGNOSIS — Z79811 Long term (current) use of aromatase inhibitors: Secondary | ICD-10-CM | POA: Insufficient documentation

## 2023-09-17 DIAGNOSIS — Z9049 Acquired absence of other specified parts of digestive tract: Secondary | ICD-10-CM | POA: Insufficient documentation

## 2023-09-17 DIAGNOSIS — N1831 Chronic kidney disease, stage 3a: Secondary | ICD-10-CM | POA: Diagnosis not present

## 2023-09-17 DIAGNOSIS — Z833 Family history of diabetes mellitus: Secondary | ICD-10-CM | POA: Diagnosis not present

## 2023-09-17 DIAGNOSIS — I129 Hypertensive chronic kidney disease with stage 1 through stage 4 chronic kidney disease, or unspecified chronic kidney disease: Secondary | ICD-10-CM | POA: Diagnosis not present

## 2023-09-17 DIAGNOSIS — N6310 Unspecified lump in the right breast, unspecified quadrant: Secondary | ICD-10-CM | POA: Insufficient documentation

## 2023-09-17 DIAGNOSIS — Z170 Estrogen receptor positive status [ER+]: Secondary | ICD-10-CM | POA: Insufficient documentation

## 2023-09-17 DIAGNOSIS — Z880 Allergy status to penicillin: Secondary | ICD-10-CM | POA: Insufficient documentation

## 2023-09-17 DIAGNOSIS — Z6841 Body Mass Index (BMI) 40.0 and over, adult: Secondary | ICD-10-CM | POA: Diagnosis not present

## 2023-09-17 DIAGNOSIS — E785 Hyperlipidemia, unspecified: Secondary | ICD-10-CM | POA: Insufficient documentation

## 2023-09-17 DIAGNOSIS — Z87891 Personal history of nicotine dependence: Secondary | ICD-10-CM | POA: Diagnosis not present

## 2023-09-17 DIAGNOSIS — Z8349 Family history of other endocrine, nutritional and metabolic diseases: Secondary | ICD-10-CM | POA: Insufficient documentation

## 2023-09-17 DIAGNOSIS — Z803 Family history of malignant neoplasm of breast: Secondary | ICD-10-CM | POA: Diagnosis not present

## 2023-09-17 DIAGNOSIS — Z1721 Progesterone receptor positive status: Secondary | ICD-10-CM | POA: Insufficient documentation

## 2023-09-17 DIAGNOSIS — C50512 Malignant neoplasm of lower-outer quadrant of left female breast: Secondary | ICD-10-CM | POA: Diagnosis not present

## 2023-09-17 DIAGNOSIS — Z8261 Family history of arthritis: Secondary | ICD-10-CM | POA: Diagnosis not present

## 2023-09-17 DIAGNOSIS — Z79899 Other long term (current) drug therapy: Secondary | ICD-10-CM | POA: Diagnosis not present

## 2023-09-17 DIAGNOSIS — G4730 Sleep apnea, unspecified: Secondary | ICD-10-CM | POA: Insufficient documentation

## 2023-09-17 DIAGNOSIS — C50919 Malignant neoplasm of unspecified site of unspecified female breast: Secondary | ICD-10-CM | POA: Diagnosis not present

## 2023-09-17 DIAGNOSIS — Z8249 Family history of ischemic heart disease and other diseases of the circulatory system: Secondary | ICD-10-CM | POA: Insufficient documentation

## 2023-09-17 LAB — CMP (CANCER CENTER ONLY)
ALT: 32 U/L (ref 0–44)
AST: 23 U/L (ref 15–41)
Albumin: 3.7 g/dL (ref 3.5–5.0)
Alkaline Phosphatase: 64 U/L (ref 38–126)
Anion gap: 10 (ref 5–15)
BUN: 14 mg/dL (ref 8–23)
CO2: 25 mmol/L (ref 22–32)
Calcium: 9.1 mg/dL (ref 8.9–10.3)
Chloride: 103 mmol/L (ref 98–111)
Creatinine: 1.24 mg/dL — ABNORMAL HIGH (ref 0.44–1.00)
GFR, Estimated: 49 mL/min — ABNORMAL LOW (ref >60)
Glucose, Bld: 101 mg/dL — ABNORMAL HIGH (ref 70–99)
Potassium: 3.8 mmol/L (ref 3.5–5.1)
Sodium: 138 mmol/L (ref 135–145)
Total Bilirubin: 0.5 mg/dL (ref <1.2)
Total Protein: 7 g/dL (ref 6.5–8.1)

## 2023-09-17 LAB — CBC WITH DIFFERENTIAL (CANCER CENTER ONLY)
Abs Immature Granulocytes: 0.04 10*3/uL (ref 0.00–0.07)
Basophils Absolute: 0.1 10*3/uL (ref 0.0–0.1)
Basophils Relative: 1 %
Eosinophils Absolute: 0.2 10*3/uL (ref 0.0–0.5)
Eosinophils Relative: 2 %
HCT: 40.9 % (ref 36.0–46.0)
Hemoglobin: 14.1 g/dL (ref 12.0–15.0)
Immature Granulocytes: 1 %
Lymphocytes Relative: 32 %
Lymphs Abs: 2.5 10*3/uL (ref 0.7–4.0)
MCH: 32.3 pg (ref 26.0–34.0)
MCHC: 34.5 g/dL (ref 30.0–36.0)
MCV: 93.8 fL (ref 80.0–100.0)
Monocytes Absolute: 0.5 10*3/uL (ref 0.1–1.0)
Monocytes Relative: 7 %
Neutro Abs: 4.5 10*3/uL (ref 1.7–7.7)
Neutrophils Relative %: 57 %
Platelet Count: 271 10*3/uL (ref 150–400)
RBC: 4.36 MIL/uL (ref 3.87–5.11)
RDW: 12.5 % (ref 11.5–15.5)
WBC Count: 7.8 10*3/uL (ref 4.0–10.5)
nRBC: 0 % (ref 0.0–0.2)

## 2023-09-17 MED ORDER — ANASTROZOLE 1 MG PO TABS: 1.0000 mg | ORAL_TABLET | Freq: Every day | ORAL | 1 refills | Status: DC

## 2023-09-17 NOTE — Assessment & Plan Note (Addendum)
Stage IIA left breast cancer, pT2 pN0 ER+, PR+, HER- Oncotype Dx 14, no adjuvant chemotherapy benefit. S/p adjuvant Radiation.  Patient tolerates Arimidex  Labs are reviewed and discussed with patient. Continue Arimidex 1 mg daily.  Refills were sent to pharmacy. Cotninue annual diagnostic mammogram in February 2025

## 2023-09-17 NOTE — Assessment & Plan Note (Signed)
Avoid nephrotoxins.  Encourage oral hydration  

## 2023-09-17 NOTE — Progress Notes (Signed)
Hematology/Oncology progress note [phone] [phone]         Patient Care Team: Marjie Skiff, NP as PCP - General (Nurse Practitioner) Scarlett Presto, RN (Inactive) as Oncology Nurse Navigator Rickard Patience, MD as Consulting Physician (Oncology) Carmina Miller, MD as Consulting Physician (Radiation Oncology) Campbell Lerner, MD as Consulting Physician (General Surgery)  ASSESSMENT & PLAN:   Cancer Staging  Invasive carcinoma of breast [facility]) Staging form: Breast, AJCC 8th Edition - Clinical stage from 11/14/2021: Stage IIA (cT2, cN0, cM0, G3, ER+, PR+, HER2-) - Signed by Rickard Patience, MD on 04/28/2022    Invasive carcinoma of breast (HCC) Stage IIA left breast cancer, pT2 pN0 ER+, PR+, HER- Oncotype Dx 14, no adjuvant chemotherapy benefit. S/p adjuvant Radiation.  Patient tolerates Arimidex  Labs are reviewed and discussed with patient. Continue Arimidex 1 mg daily.  Refills were sent to pharmacy. Cotninue annual diagnostic mammogram in February 2025  Aromatase inhibitor use Recommend calcium and vitamin D supplementation DEXA showed normal bone density.  Stage 3a chronic kidney disease (HCC) Avoid nephrotoxins.  Encourage oral hydration   Orders Placed This Encounter  Procedures   MM 3D DIAGNOSTIC MAMMOGRAM BILATERAL BREAST    Standing Status:   Future    Expected Date:   11/10/2023    Expiration Date:   09/16/2024    Reason for Exam (SYMPTOM  OR DIAGNOSIS REQUIRED):   Invasive carcinoma of breas    Preferred imaging location?:   [facility]   US Breast Complete Uni Left Inc Axilla    Standing Status:   Future    Expected Date:   11/10/2023    Expiration Date:   09/16/2024    Reason for Exam (SYMPTOM  OR DIAGNOSIS REQUIRED):   Invasive carcinoma of breas    Preferred imaging location?:   [facility]   US Breast Complete Uni Right Inc Axilla    Standing Status:   Future    Expected Date:   11/10/2023    Expiration Date:   09/16/2024     Reason for Exam (SYMPTOM  OR DIAGNOSIS REQUIRED):   Invasive carcinoma of breas    Preferred imaging location?:   [facility]   CBC with Differential (Cancer Center Only)    Standing Status:   Future    Expected Date:   03/17/2024    Expiration Date:   09/16/2024   CMP (Cancer Center only)    Standing Status:   Future    Expected Date:   03/17/2024    Expiration Date:   09/16/2024   Follow-up 6 months.  All questions were answered. The patient knows to call the clinic with any problems, questions or concerns.  Rickard Patience, MD, PhD [facility] Hematology Oncology 09/17/2023   CHIEF COMPLAINTS/REASON FOR VISIT:  Follow up for left breast cancer  HISTORY OF PRESENTING ILLNESS:   Dorothy Cunningham is a  62 y.o.  female presents to follow-up for Stage Iia left breast cancer Oncological history summary as below. Oncology History  Invasive carcinoma of breast (HCC)  11/14/2021 Initial Diagnosis   Invasive carcinoma of breast [facility]) Patient felt a left breast mass. 10/30/2021, bilateral diagnostic mammogram showed 21 mm mass in the left breast, 5:00, 9 cm from nipple.  No suspicious left axillary lymphadenopathy.  No mammographic evidence of malignancy in the right breast.   11/06/2021, left breast ultrasound-guided biopsy showed invasive mammary carcinoma, no special type.  Grade 3, DCIS not identified, LVI not identified.  ER+/PR +HER2 -  Menarche 62 years of age, patient has 3 children.  She was 13 years old when she gave birth to her first child.  Short-term OCP use from [phone].  Patient has a history of hysterectomy 1990.  She denies family history of breast cancer. Denies any hormone replacement therapy.  Denies any chest radiation.  She recalls left breast biopsy in 2012.  She did not know details of the pathology.   11/14/2021 Cancer Staging   Staging form: Breast, AJCC 8th Edition - Clinical stage from 11/14/2021: Stage IIA (cT2, cN0, cM0, G3, ER+, PR+, HER2-) - Signed by Rickard Patience, MD on 04/28/2022 Stage prefix: Initial diagnosis Histologic grading system: 3 grade system   01/26/2022 Surgery   Left breast lumpectomy and sentinel lymph node biopsy. Invasive mammary carcinoma, DCIS, 3 lymph nodes negative for malignancy.  Grade 3, all margins negative for invasive carcinoma.  Distance from closest margin 4 mm.  All margins negative for DCIS. pT2 pN0   01/26/2022 Oncotype testing   Oncotype DX breast recurrence score  14   04/01/2022 - 04/14/2022 Radiation Therapy   Adjuvant left breast radiation   06/29/2022 Imaging   DEXA normal bone density    Patient reports feeling well.  She tolerates Arimidex 1mg  daily.  She denies hot flush or arthralgia.  She was referred to urology for evaluation of bladder wall echogenic focus on recent renal US.   Review of Systems  Constitutional:  Negative for appetite change, chills, fatigue and fever.  HENT:   Negative for hearing loss and voice change.   Eyes:  Negative for eye problems.  Respiratory:  Negative for chest tightness and cough.   Cardiovascular:  Negative for chest pain.  Gastrointestinal:  Negative for abdominal distention, abdominal pain and blood in stool.  Endocrine: Negative for hot flashes.  Genitourinary:  Negative for difficulty urinating and frequency.   Musculoskeletal:  Negative for arthralgias.  Skin:  Negative for itching and rash.  Neurological:  Negative for extremity weakness.  Hematological:  Negative for adenopathy.  Psychiatric/Behavioral:  Negative for confusion.     MEDICAL HISTORY:  Past Medical History:  Diagnosis Date   Allergic rhinitis    Anxiety    Breast cancer, left breast (HCC) 10/2021   Chronic kidney disease, stage 3a (HCC)    COPD (chronic obstructive pulmonary disease) (HCC)    History of epilepsy    as a teenager   Hyperlipidemia    Hypertension    Morbid obesity with BMI of 45.0-49.9, adult (HCC)    Sleep apnea     SURGICAL HISTORY: Past Surgical History:   Procedure Laterality Date   Bladder Tuck  1995   BREAST BIOPSY Left 2014   benign   BREAST BIOPSY Left 11/06/2021   u/s bx  5 o'clock, VENUS clip- INVASIVE MAMMARY CARCINOMA,   BREAST LUMPECTOMY WITH SENTINEL LYMPH NODE BIOPSY Left 01/26/2022   Procedure: BREAST LUMPECTOMY WITH SENTINEL LYMPH NODE BX;  Surgeon: Earline Mayotte, MD;  Location: [facility] ORS;  Service: General;  Laterality: Left;   CHOLECYSTECTOMY  08/26/2022   COLONOSCOPY     PARTIAL HYSTERECTOMY  1995   due to heavy periods    SOCIAL HISTORY: Social History   Socioeconomic History   Marital status: Married    Spouse name: Leonette Most   Number of children: 4   Years of education: Not on file   Highest education level: Not on file  Occupational History   Not on file  Tobacco Use   Smoking  status: Former    Current packs/day: 0.00    Average packs/day: 0.8 packs/day for 40.0 years (30.0 ttl pk-yrs)    Types: Cigarettes    Start date: 09/28/1974    Quit date: 09/28/2014    Years since quitting: 8.9   Smokeless tobacco: Never  Vaping Use   Vaping status: Former  Substance and Sexual Activity   Alcohol use: No   Drug use: No   Sexual activity: Yes  Other Topics Concern   Not on file  Social History Narrative   Not on file   Social Drivers of Health   Financial Resource Strain: Low Risk  (10/14/2022)   Overall Financial Resource Strain (CARDIA)    Difficulty of Paying Living Expenses: Not hard at all  Food Insecurity: No Food Insecurity (10/14/2022)   Hunger Vital Sign    Worried About Running Out of Food in the Last Year: Never true    Ran Out of Food in the Last Year: Never true  Transportation Needs: No Transportation Needs (10/14/2022)   PRAPARE - Administrator, Civil Service (Medical): No    Lack of Transportation (Non-Medical): No  Physical Activity: Insufficiently Active (10/14/2022)   Exercise Vital Sign    Days of Exercise per Week: 2 days    Minutes of Exercise per Session: 30 min   Stress: No Stress Concern Present (10/14/2022)   [employer] of Occupational Health - Occupational Stress Questionnaire    Feeling of Stress : Not at all  Social Connections: Socially Integrated (10/14/2022)   Social Connection and Isolation Panel [NHANES]    Frequency of Communication with Friends and Family: More than three times a week    Frequency of Social Gatherings with Friends and Family: More than three times a week    Attends Religious Services: More than 4 times per year    Active Member of [employer] or Organizations: Yes    Attends Banker Meetings: Never    Marital Status: Married  Catering manager Violence: Not At Risk (10/14/2022)   Humiliation, Afraid, Rape, and Kick questionnaire    Fear of Current or Ex-Partner: No    Emotionally Abused: No    Physically Abused: No    Sexually Abused: No    FAMILY HISTORY: Family History  Problem Relation Age of Onset   Thyroid disease Mother    Asthma Mother    Osteoarthritis Father    CAD Father    Diabetes Maternal Uncle    Diabetes Maternal Grandfather    Tuberculosis Maternal Grandfather    Diabetes Paternal Grandmother    Heart attack Paternal Grandfather    Breast cancer Other        great grandmother    ALLERGIES:  is allergic to penicillins.  MEDICATIONS:  Current Outpatient Medications  Medication Sig Dispense Refill   albuterol (VENTOLIN HFA) 108 (90 Base) MCG/ACT inhaler INHALE 2 PUFFS INTO THE LUNGS EVERY 6 HOURS AS NEEDED FOR WHEEZING OR SHORTNESS OF BREATH 6.7 g 0   amLODipine (NORVASC) 2.5 MG tablet Take 1 tablet (2.5 mg total) by mouth daily. 90 tablet 4   aspirin 81 MG EC tablet Take 81 mg by mouth daily.     atorvastatin (LIPITOR) 40 MG tablet Take 1 tablet (40 mg total) by mouth daily. 90 tablet 4   b complex vitamins capsule Take 1 capsule by mouth daily.     buPROPion (WELLBUTRIN XL) 300 MG 24 hr tablet TAKE 1 TABLET(300 MG) BY MOUTH DAILY 90 tablet  4   Calcium-Magnesium-Vitamin D  (CALCIUM 1200+D3 PO)      Cholecalciferol (VITAMIN D) 50 MCG (2000 UT) CAPS Take 4,000 Units by mouth daily.     diclofenac Sodium (VOLTAREN) 1 % GEL APPLY 2 GRAMS TOPICALLY TO THE AFFECTED AREA FOUR TIMES DAILY AS NEEDED 100 g 2   diphenhydrAMINE (BENADRYL) 25 MG tablet Take 25 mg by mouth daily.     fluticasone (FLONASE) 50 MCG/ACT nasal spray SHAKE LIQUID AND USE 2 SPRAYS IN EACH NOSTRIL DAILY 48 g 0   Garlic (GARLIQUE PO) Take 1 tablet by mouth daily.     losartan (COZAAR) 100 MG tablet TAKE 1 TABLET(100 MG) BY MOUTH DAILY 90 tablet 0   Multiple Vitamin (MULTIVITAMIN) tablet Take 1 tablet by mouth daily.     nystatin ointment (MYCOSTATIN) APPLY TOPICALLY TO THE AFFECTED AREA THREE TIMES DAILY 60 g 3   Omega-3 Fatty Acids (FISH OIL PO) Take 1 capsule by mouth daily.     umeclidinium-vilanterol (ANORO ELLIPTA) 62.5-25 MCG/ACT AEPB INHALE 1 PUFF INTO THE LUNGS DAILY AT 6 AM 60 each 5   anastrozole (ARIMIDEX) 1 MG tablet Take 1 tablet (1 mg total) by mouth daily. 90 tablet 1   No current facility-administered medications for this visit.     PHYSICAL EXAMINATION: ECOG PERFORMANCE STATUS: 0 - Asymptomatic Vitals:   09/17/23 1129  BP: (!) 152/79  Pulse: 86  Temp: 98.8 F (37.1 C)   There were no vitals filed for this visit.   Physical Exam Constitutional:      General: She is not in acute distress. HENT:     Head: Normocephalic and atraumatic.  Eyes:     General: No scleral icterus. Cardiovascular:     Rate and Rhythm: Normal rate and regular rhythm.  Pulmonary:     Effort: Pulmonary effort is normal. No respiratory distress.     Breath sounds: No wheezing.  Abdominal:     General: Bowel sounds are normal. There is no distension.     Palpations: Abdomen is soft.  Musculoskeletal:        General: Normal range of motion.     Cervical back: Normal range of motion and neck supple.  Skin:    General: Skin is warm and dry.     Findings: No erythema or rash.  Neurological:      Mental Status: She is alert and oriented to person, place, and time. Mental status is at baseline.  Psychiatric:        Mood and Affect: Mood normal.     LABORATORY DATA:  I have reviewed the data as listed     Latest Ref Rng & Units 09/17/2023   11:04 AM 03/19/2023   10:00 AM 10/14/2022    1:35 PM  CBC  WBC 4.0 - 10.5 K/uL 7.8  9.4  8.1   Hemoglobin 12.0 - 15.0 g/dL 16.1  09.6  04.5   Hematocrit 36.0 - 46.0 % 40.9  41.9  41.9   Platelets 150 - 400 K/uL 271  258  322       Latest Ref Rng & Units 09/17/2023   11:04 AM 04/14/2023    1:39 PM 03/19/2023   10:00 AM  CMP  Glucose 70 - 99 mg/dL 409  92  811   BUN 8 - 23 mg/dL 14  17  15    Creatinine 0.44 - 1.00 mg/dL 9.14  7.82  9.56   Sodium 135 - 145 mmol/L 138  142  139  Potassium 3.5 - 5.1 mmol/L 3.8  3.9  3.6   Chloride 98 - 111 mmol/L 103  101  103   CO2 22 - 32 mmol/L 25  25  25    Calcium 8.9 - 10.3 mg/dL 9.1  16.1  9.6   Total Protein 6.5 - 8.1 g/dL 7.0  7.0  7.4   Total Bilirubin <1.2 mg/dL 0.5  0.5  0.5   Alkaline Phos 38 - 126 U/L 64  80  65   AST 15 - 41 U/L 23  23  25    ALT 0 - 44 U/L 32  37  37       RADIOGRAPHIC STUDIES: I have personally reviewed the radiological images as listed and agreed with the findings in the report. No results found.

## 2023-09-17 NOTE — Assessment & Plan Note (Signed)
Recommend calcium and vitamin D supplementation DEXA showed normal bone density. 

## 2023-09-20 ENCOUNTER — Encounter: Payer: Self-pay | Admitting: Oncology

## 2023-09-28 ENCOUNTER — Other Ambulatory Visit: Payer: Federal, State, Local not specified - PPO | Admitting: Urology

## 2023-10-02 ENCOUNTER — Other Ambulatory Visit: Payer: Self-pay | Admitting: Urology

## 2023-10-02 ENCOUNTER — Other Ambulatory Visit: Payer: Self-pay | Admitting: Nurse Practitioner

## 2023-10-02 DIAGNOSIS — B379 Candidiasis, unspecified: Secondary | ICD-10-CM

## 2023-10-05 NOTE — Telephone Encounter (Signed)
 Requested Prescriptions  Pending Prescriptions Disp Refills   losartan  (COZAAR ) 100 MG tablet [Pharmacy Med Name: LOSARTAN  100MG  TABLETS] 90 tablet 0    Sig: TAKE 1 TABLET(100 MG) BY MOUTH DAILY     Cardiovascular:  Angiotensin Receptor Blockers Failed - 10/05/2023  1:18 PM      Failed - Cr in normal range and within 180 days    Creatinine  Date Value Ref Range Status  09/17/2023 1.24 (H) 0.44 - 1.00 mg/dL Final         Failed - Last BP in normal range    BP Readings from Last 1 Encounters:  09/17/23 (!) 152/79         Passed - K in normal range and within 180 days    Potassium  Date Value Ref Range Status  09/17/2023 3.8 3.5 - 5.1 mmol/L Final         Passed - Patient is not pregnant      Passed - Valid encounter within last 6 months    Recent Outpatient Visits           4 months ago COVID-19 virus RNA test result positive at limit of detection   [place] [facility] [place], Melanie DASEN, NP   5 months ago Centrilobular emphysema (HCC)   [place] [facility] [place], Melanie T, NP   11 months ago Invasive carcinoma of breast (HCC)   [place] [facility] [place], Melanie T, NP   1 year ago Stage 3a chronic kidney disease (HCC)   [place] [facility] [place], Melanie T, NP   1 year ago Stage 3a chronic kidney disease (HCC)   [place] [facility] [place], Melanie DASEN, NP       Future Appointments             In 2 weeks Cannady, Jolene T, NP [place] [facility]

## 2023-10-08 ENCOUNTER — Encounter: Payer: Self-pay | Admitting: Nurse Practitioner

## 2023-10-12 ENCOUNTER — Ambulatory Visit
Admission: RE | Admit: 2023-10-12 | Discharge: 2023-10-12 | Disposition: A | Discharge status: Home / Self Care | Payer: Federal, State, Local not specified - PPO | Source: Ambulatory Visit | Attending: Urology | Admitting: Urology

## 2023-10-12 DIAGNOSIS — N3289 Other specified disorders of bladder: Secondary | ICD-10-CM | POA: Insufficient documentation

## 2023-10-12 MED ORDER — SODIUM CHLORIDE 0.9 % IV SOLN: INTRAVENOUS | Status: DC

## 2023-10-12 MED ORDER — IOHEXOL 300 MG/ML  SOLN
100.0000 mL | Freq: Once | INTRAMUSCULAR | Status: AC | PRN
  Administered 2023-10-12: 100 mL via INTRAVENOUS

## 2023-10-16 ENCOUNTER — Other Ambulatory Visit: Payer: Self-pay | Admitting: Nurse Practitioner

## 2023-10-16 NOTE — Patient Instructions (Signed)
Be Involved in Caring For Your Health:  Taking Medications When medications are taken as directed, they can greatly improve your health. But if they are not taken as prescribed, they may not work. In some cases, not taking them correctly can be harmful. To help ensure your treatment remains effective and safe, understand your medications and how to take them. Bring your medications to each visit for review by your provider.  Your lab results, notes, and after visit summary will be available on My Chart. We strongly encourage you to use this feature. If lab results are abnormal the clinic will contact you with the appropriate steps. If the clinic does not contact you assume the results are satisfactory. You can always view your results on My Chart. If you have questions regarding your health or results, please contact the clinic during office hours. You can also ask questions on My Chart.  We at [facility] are grateful that you chose [place] to provide your care. We strive to provide evidence-based and compassionate care and are always looking for feedback. If you get a survey from the clinic please complete this so we can hear your opinions.  Heart-Healthy Eating Plan Many factors influence your heart health, including eating and exercise habits. Heart health is also called coronary health. Coronary risk increases with abnormal blood fat (lipid) levels. A heart-healthy eating plan includes limiting unhealthy fats, increasing healthy fats, limiting salt (sodium) intake, and making other diet and lifestyle changes. What is my plan? Your health care provider may recommend that: You limit your fat intake to _________% or less of your total calories each day. You limit your saturated fat intake to _________% or less of your total calories each day. You limit the amount of cholesterol in your diet to less than _________ mg per day. You limit the amount of sodium in your diet to less than _________  mg per day. What are tips for following this plan? Cooking Cook foods using methods other than frying. Baking, boiling, grilling, and broiling are all good options. Other ways to reduce fat include: Removing the skin from poultry. Removing all visible fats from meats. Steaming vegetables in water or broth. Meal planning  At meals, imagine dividing your plate into fourths: Fill one-half of your plate with vegetables and green salads. Fill one-fourth of your plate with whole grains. Fill one-fourth of your plate with lean protein foods. Eat 2-4 cups of vegetables per day. One cup of vegetables equals 1 cup (91 g) broccoli or cauliflower florets, 2 medium carrots, 1 large bell pepper, 1 large sweet potato, 1 large tomato, 1 medium white potato, 2 cups (150 g) raw leafy greens. Eat 1-2 cups of fruit per day. One cup of fruit equals 1 small apple, 1 large banana, 1 cup (237 g) mixed fruit, 1 large orange,  cup (82 g) dried fruit, 1 cup (240 mL) 100% fruit juice. Eat more foods that contain soluble fiber. Examples include apples, broccoli, carrots, beans, peas, and barley. Aim to get 25-30 g of fiber per day. Increase your consumption of legumes, nuts, and seeds to 4-5 servings per week. One serving of dried beans or legumes equals  cup (90 g) cooked, 1 serving of nuts is  oz (12 almonds, 24 pistachios, or 7 walnut halves), and 1 serving of seeds equals  oz (8 g). Fats Choose healthy fats more often. Choose monounsaturated and polyunsaturated fats, such as olive and canola oils, avocado oil, flaxseeds, walnuts, almonds, and seeds. Eat  more omega-3 fats. Choose salmon, mackerel, sardines, tuna, flaxseed oil, and ground flaxseeds. Aim to eat fish at least 2 times each week. Check food labels carefully to identify foods with trans fats or high amounts of saturated fat. Limit saturated fats. These are found in animal products, such as meats, butter, and cream. Plant sources of saturated fats  include palm oil, palm kernel oil, and coconut oil. Avoid foods with partially hydrogenated oils in them. These contain trans fats. Examples are stick margarine, some tub margarines, cookies, crackers, and other baked goods. Avoid fried foods. General information Eat more home-cooked food and less restaurant, buffet, and fast food. Limit or avoid alcohol. Limit foods that are high in added sugar and simple starches such as foods made using white refined flour (white breads, pastries, sweets). Lose weight if you are overweight. Losing just 5-10% of your body weight can help your overall health and prevent diseases such as diabetes and heart disease. Monitor your sodium intake, especially if you have high blood pressure. Talk with your health care provider about your sodium intake. Try to incorporate more vegetarian meals weekly. What foods should I eat? Fruits All fresh, canned (in natural juice), or frozen fruits. Vegetables Fresh or frozen vegetables (raw, steamed, roasted, or grilled). Green salads. Grains Most grains. Choose whole wheat and whole grains most of the time. Rice and pasta, including brown rice and pastas made with whole wheat. Meats and other proteins Lean, well-trimmed beef, veal, pork, and lamb. Chicken and [place] without skin. All fish and shellfish. Wild duck, rabbit, pheasant, and venison. Egg whites or low-cholesterol egg substitutes. Dried beans, peas, lentils, and tofu. Seeds and most nuts. Dairy Low-fat or nonfat cheeses, including ricotta and mozzarella. Skim or 1% milk (liquid, powdered, or evaporated). Buttermilk made with low-fat milk. Nonfat or low-fat yogurt. Fats and oils Non-hydrogenated (trans-free) margarines. Vegetable oils, including soybean, sesame, sunflower, olive, avocado, peanut, safflower, corn, canola, and cottonseed. Salad dressings or mayonnaise made with a vegetable oil. Beverages Water (mineral or sparkling). Coffee and tea. Unsweetened ice  tea. Diet beverages. Sweets and desserts Sherbet, gelatin, and fruit ice. Small amounts of dark chocolate. Limit all sweets and desserts. Seasonings and condiments All seasonings and condiments. The items listed above may not be a complete list of foods and beverages you can eat. Contact a dietitian for more options. What foods should I avoid? Fruits Canned fruit in heavy syrup. Fruit in cream or butter sauce. Fried fruit. Limit coconut. Vegetables Vegetables cooked in cheese, cream, or butter sauce. Fried vegetables. Grains Breads made with saturated or trans fats, oils, or whole milk. Croissants. Sweet rolls. Donuts. High-fat crackers, such as cheese crackers and chips. Meats and other proteins Fatty meats, such as hot dogs, ribs, sausage, bacon, rib-eye roast or steak. High-fat deli meats, such as salami and bologna. Caviar. Domestic duck and goose. Organ meats, such as liver. Dairy Cream, sour cream, cream cheese, and creamed cottage cheese. Whole-milk cheeses. Whole or 2% milk (liquid, evaporated, or condensed). Whole buttermilk. Cream sauce or high-fat cheese sauce. Whole-milk yogurt. Fats and oils Meat fat, or shortening. Cocoa butter, hydrogenated oils, palm oil, coconut oil, palm kernel oil. Solid fats and shortenings, including bacon fat, salt pork, lard, and butter. Nondairy cream substitutes. Salad dressings with cheese or sour cream. Beverages Regular sodas and any drinks with added sugar. Sweets and desserts Frosting. Pudding. Cookies. Cakes. Pies. Milk chocolate or white chocolate. Buttered syrups. Full-fat ice cream or ice cream drinks. The items listed above may  not be a complete list of foods and beverages to avoid. Contact a dietitian for more information. Summary Heart-healthy meal planning includes limiting unhealthy fats, increasing healthy fats, limiting salt (sodium) intake and making other diet and lifestyle changes. Lose weight if you are overweight. Losing just  5-10% of your body weight can help your overall health and prevent diseases such as diabetes and heart disease. Focus on eating a balance of foods, including fruits and vegetables, low-fat or nonfat dairy, lean protein, nuts and legumes, whole grains, and heart-healthy oils and fats. This information is not intended to replace advice given to you by your health care provider. Make sure you discuss any questions you have with your health care provider. Document Revised: 10/20/2021 Document Reviewed: 10/20/2021 Elsevier Patient Education  2024 [employer].

## 2023-10-18 ENCOUNTER — Other Ambulatory Visit: Payer: Self-pay

## 2023-10-18 MED ORDER — NYSTATIN 100000 UNIT/GM EX OINT: TOPICAL_OINTMENT | CUTANEOUS | 3 refills | Status: AC

## 2023-10-18 NOTE — Telephone Encounter (Signed)
Requested medication (s) are due for refill today: Yes  Requested medication (s) are on the active medication list: Yes  Last refill:  12/10/22  Future visit scheduled: Yes  Notes to clinic:  Manual review.    Requested Prescriptions  Pending Prescriptions Disp Refills   nystatin ointment (MYCOSTATIN) [Pharmacy Med Name: NYSTATIN OINTMENT 30GM] 60 g 3    Sig: APPLY TO THE AFFECTED AREA THREE TIMES DAILY     Off-Protocol Failed - 10/18/2023  9:33 AM      Failed - Medication not assigned to a protocol, review manually.      Passed - Valid encounter within last 12 months    Recent Outpatient Visits           5 months ago COVID-19 virus RNA test result positive at limit of detection   [place] [facility] [place], Dorie Rank, NP   6 months ago Centrilobular emphysema (HCC)   [place] [facility] [place], Corrie Dandy T, NP   1 year ago Invasive carcinoma of breast (HCC)   [place] [facility] [place], Corrie Dandy T, NP   1 year ago Stage 3a chronic kidney disease (HCC)   [place] [facility] [place], Corrie Dandy T, NP   1 year ago Stage 3a chronic kidney disease (HCC)   [place] [facility] [place], Dorie Rank, NP       Future Appointments             Tomorrow Marjie Skiff, NP [facility]

## 2023-10-19 ENCOUNTER — Encounter: Payer: Self-pay | Admitting: Nurse Practitioner

## 2023-10-19 ENCOUNTER — Ambulatory Visit (INDEPENDENT_AMBULATORY_CARE_PROVIDER_SITE_OTHER): Payer: Federal, State, Local not specified - PPO | Admitting: Nurse Practitioner

## 2023-10-19 VITALS — BP 136/70 | HR 86 | Temp 97.4°F | Ht 60.6 in | Wt 251.8 lb

## 2023-10-19 DIAGNOSIS — C50919 Malignant neoplasm of unspecified site of unspecified female breast: Secondary | ICD-10-CM | POA: Diagnosis not present

## 2023-10-19 DIAGNOSIS — Z23 Encounter for immunization: Secondary | ICD-10-CM | POA: Diagnosis not present

## 2023-10-19 DIAGNOSIS — I10 Essential (primary) hypertension: Secondary | ICD-10-CM

## 2023-10-19 DIAGNOSIS — Z0000 Encounter for general adult medical examination without abnormal findings: Secondary | ICD-10-CM

## 2023-10-19 DIAGNOSIS — E559 Vitamin D deficiency, unspecified: Secondary | ICD-10-CM

## 2023-10-19 DIAGNOSIS — N1831 Chronic kidney disease, stage 3a: Secondary | ICD-10-CM

## 2023-10-19 DIAGNOSIS — F321 Major depressive disorder, single episode, moderate: Secondary | ICD-10-CM

## 2023-10-19 DIAGNOSIS — I700 Atherosclerosis of aorta: Secondary | ICD-10-CM

## 2023-10-19 DIAGNOSIS — J432 Centrilobular emphysema: Secondary | ICD-10-CM | POA: Diagnosis not present

## 2023-10-19 DIAGNOSIS — N3289 Other specified disorders of bladder: Secondary | ICD-10-CM

## 2023-10-19 DIAGNOSIS — E782 Mixed hyperlipidemia: Secondary | ICD-10-CM

## 2023-10-19 LAB — MICROALBUMIN, URINE WAIVED
Creatinine, Urine Waived: 50 mg/dL (ref 10–300)
Microalb, Ur Waived: 10 mg/L (ref 0–19)

## 2023-10-19 MED ORDER — ANORO ELLIPTA 62.5-25 MCG/ACT IN AEPB: INHALATION_SPRAY | RESPIRATORY_TRACT | 5 refills | Status: DC

## 2023-10-19 MED ORDER — ATORVASTATIN CALCIUM 40 MG PO TABS: 40.0000 mg | ORAL_TABLET | Freq: Every day | ORAL | 4 refills | Status: DC

## 2023-10-19 MED ORDER — AMLODIPINE BESYLATE 2.5 MG PO TABS: 2.5000 mg | ORAL_TABLET | Freq: Every day | ORAL | 4 refills | Status: AC

## 2023-10-19 MED ORDER — LOSARTAN POTASSIUM 100 MG PO TABS: 100.0000 mg | ORAL_TABLET | Freq: Every day | ORAL | 4 refills | Status: DC

## 2023-10-19 MED ORDER — BUPROPION HCL ER (XL) 300 MG PO TB24: ORAL_TABLET | ORAL | 4 refills | Status: AC

## 2023-10-19 NOTE — Assessment & Plan Note (Signed)
Chronic.  Continue daily supplement and adjust as needed.  Check Vit D level today.

## 2023-10-19 NOTE — Assessment & Plan Note (Signed)
Chronic, ongoing.  FEV1 62% and FEV1/FVC 81% in February 2023.  Continue Anoro + continue Albuterol as needed, discussed at length with patient.  Continue lung screening annually, as past smoker with 30 year smoking history.  Refills as needed.  Spirometry next visit.

## 2023-10-19 NOTE — Assessment & Plan Note (Signed)
Chronic, stable.  Continue current medication regimen and adjust as needed.  Lipid panel today. 

## 2023-10-19 NOTE — Assessment & Plan Note (Signed)
Ongoing with urine ALB 47 January 2024, continue ARB for kidney protection.  Recheck CMP today.  Avoid ACE due to COPD.  Nephrology referral placed.

## 2023-10-19 NOTE — Assessment & Plan Note (Signed)
Noted on imaging, continue collaboration with urology, recent note reviewed.

## 2023-10-19 NOTE — Assessment & Plan Note (Signed)
Chronic, stable.  BP trending down on recheck.  Educated on DASH diet and recommend checking BP at home daily and documenting for provider visits. Continue Losartan daily, which is beneficial for proteinuria (47 January 2024) and continue 2.5 MG Amlodipine daily, educated her on these.  Avoid ACE due to underlying lung disease. Scheduled to see nephrology. LABS: CBC, TSH, CMP, urine ALB. Return in 6 months.

## 2023-10-19 NOTE — Assessment & Plan Note (Signed)
 Ongoing. Lung screening CT, along with 3 vessel coronary artery disease.  Educated patient on this and recommend continue statin daily +  ASA 81 MG daily for prevention.  Continue cessation of smoking.  Focus on healthy diet and regular exercise.

## 2023-10-19 NOTE — Assessment & Plan Note (Signed)
BMI 48.21.  Recommended eating smaller high protein, low fat meals more frequently and exercising 30 mins a day 5 times a week with a goal of 10-15lb weight loss in the next 3 months. Patient voiced their understanding and motivation to adhere to these recommendations.

## 2023-10-19 NOTE — Assessment & Plan Note (Signed)
 Ongoing.  Continue collaboration with oncology at this time.  Recent notes reviewed.

## 2023-10-19 NOTE — Assessment & Plan Note (Signed)
Chronic, stable on Wellbutrin.  Continue current regimen and adjust as needed.  Denies SI/HI.  Refills as needed. 

## 2023-10-19 NOTE — Progress Notes (Signed)
BP 136/70 (BP Location: Left Arm, Patient Position: Sitting, Cuff Size: Large)   Pulse 86   Temp (!) 97.4 F (36.3 C) (Oral)   Ht 5' 0.6" (1.539 m)   Wt 251 lb 12.8 oz (114.2 kg)   LMP  (LMP Unknown)   SpO2 98%   BMI 48.21 kg/m    Subjective:    Patient ID: Dorothy Cunningham, female    DOB: 1960/12/13, 63 y.o.   MRN: [id number]  HPI: Dorothy Cunningham is a 63 y.o. female presenting on 10/19/2023 for comprehensive medical examination. Current medical complaints include:none  She currently lives with: husband Menopausal Symptoms: no  BREAST CANCER: Diagnosed on 11/14/21, invasive carcinoma left breast, no lymph node involvement noted.  Seen by oncology last 09/17/23 and radiology 06/02/23, she finished radiation treatment on 04/14/22.  Continues Arimidex daily.  Up to date on mammogram and bone density.  HYPERTENSION / HYPERLIPIDEMIA Continues Losartan, Amlodipine, and Atorvastatin.   Has diagnosed OSA -- does not use CPAP, raised head of bed. Satisfied with current treatment? yes Duration of hypertension: chronic BP monitoring frequency: not checking BP range:  BP medication side effects: no Duration of hyperlipidemia: chronic Cholesterol medication side effects: no Cholesterol supplements: fish oil Medication compliance: good compliance Aspirin: yes Recent stressors: no Recurrent headaches: no Visual changes: no Palpitations: no Dyspnea: if does too much activity Chest pain: no Lower extremity edema: no Dizzy/lightheaded: no    COPD Uses Anoro daily and Albuterol as needed.  Quit smoking >15 years ago, smoked cigarettes for 30 years, started at age 87,  about 1 PPD.  Emphysema and aortic atherosclerosis noted on lung cancer screening. Last lung screening 06/21/23. COPD status: stable Satisfied with current treatment?: yes Oxygen use: no Dyspnea frequency: if does too much activity Cough frequency: no Rescue inhaler frequency: none in awhile Limitation of  activity: no Productive cough: no Last Spirometry:  11/17/21 FEV1 62% and FEV1/FVC 81%, previous 88% and 112% Pneumovax: Up To Date Influenza: Up to Date   CHRONIC KIDNEY DISEASE Saw nephrology last 06/16/23 for CKD and urology 09/01/23 for bladder mass, had CT to further assess on 10/12/23, no results as of yet. CKD status: stable Medications renally dose: yes Previous renal evaluation: no Pneumovax:  Up to Date Influenza Vaccine:  Up to Date    DEPRESSION Take Wellbutrin daily. Mood status: stable Satisfied with current treatment?: yes Symptom severity: mild  Duration of current treatment : chronic Side effects: no Medication compliance: good compliance Psychotherapy/counseling: none Depressed mood: no Anxious mood: no Anhedonia: no Significant weight loss or gain: no Insomnia: none Fatigue: no Feelings of worthlessness or guilt: no Impaired concentration/indecisiveness: no Suicidal ideations: no Hopelessness: no Crying spells: no    10/19/2023    1:10 PM 04/14/2023    1:37 PM 10/14/2022    1:26 PM 08/14/2022   10:51 AM 07/07/2022    2:37 PM  Depression screen PHQ 2/9  Decreased Interest 0 0 0 0 0  Down, Depressed, Hopeless 0 0 0 0 0  PHQ - 2 Score 0 0 0 0 0  Altered sleeping 0 0 0 0 0  Tired, decreased energy 0 2 0 1 0  Change in appetite 0 1 0 0 0  Feeling bad or failure about yourself  0 0 0 0 0  Trouble concentrating 0 0 0 0 0  Moving slowly or fidgety/restless 0 0 0 0 0  Suicidal thoughts 0 0 0 0 0  PHQ-9 Score 0 3 0 1  0  Difficult doing work/chores Not difficult at all Not difficult at all Not difficult at all Not difficult at all Not difficult at all      10/19/2023    1:11 PM 04/14/2023    1:37 PM 10/14/2022    1:26 PM 08/14/2022   10:51 AM  GAD 7 : Generalized Anxiety Score  Nervous, Anxious, on Edge 0 0 0 0  Control/stop worrying 0 0 0 0  Worry too much - different things 0 0 0 0  Trouble relaxing 0 0 0 0  Restless 0 0 0 0  Easily annoyed or  irritable 0 0 0 0  Afraid - awful might happen 0 0 0 0  Total GAD 7 Score 0 0 0 0  Anxiety Difficulty Not difficult at all Not difficult at all Not difficult at all Not difficult at all      08/14/2022   10:51 AM 08/26/2022    6:37 AM 09/03/2022    2:15 PM 04/14/2023    1:36 PM 10/19/2023    1:10 PM  Fall Risk  Falls in the past year? 0  0 0 0  Was there an injury with Fall? 0   0 0  Fall Risk Category Calculator 0   0 0  Fall Risk Category (Retired) Low      (RETIRED) Patient Fall Risk Level  Moderate fall risk     Patient at Risk for Falls Due to No Fall Risks   No Fall Risks No Fall Risks  Fall risk Follow up Falls evaluation completed   Falls evaluation completed Falls evaluation completed    Functional Status Survey: Is the patient deaf or have difficulty hearing?: No Does the patient have difficulty seeing, even when wearing glasses/contacts?: No Does the patient have difficulty concentrating, remembering, or making decisions?: No Does the patient have difficulty walking or climbing stairs?: No Does the patient have difficulty dressing or bathing?: No Does the patient have difficulty doing errands alone such as visiting a doctor's office or shopping?: No   Past Medical History:  Past Medical History:  Diagnosis Date   Allergic rhinitis    Allergy 1963   Penicillin   Anxiety    Arthritis 4 years   Breast cancer, left breast (HCC) 10/2021   Chronic kidney disease, stage 3a (HCC)    COPD (chronic obstructive pulmonary disease) (HCC)    Depression    Emphysema of lung (HCC) 5 years   History of epilepsy    as a teenager   Hyperlipidemia    Hypertension    Morbid obesity with BMI of 45.0-49.9, adult (HCC)    Seizures (HCC) About 1970   none for 30 + years   Sleep apnea     Surgical History:  Past Surgical History:  Procedure Laterality Date   ABDOMINAL HYSTERECTOMY  about 2006   Bladder Tuck  1995   BREAST BIOPSY Left 2014   benign   BREAST BIOPSY Left  11/06/2021   u/s bx  5 o'clock, VENUS clip- INVASIVE MAMMARY CARCINOMA,   BREAST LUMPECTOMY WITH SENTINEL LYMPH NODE BIOPSY Left 01/26/2022   Procedure: BREAST LUMPECTOMY WITH SENTINEL LYMPH NODE BX;  Surgeon: Earline Mayotte, MD;  Location: [facility] ORS;  Service: General;  Laterality: Left;   CHOLECYSTECTOMY  08/26/2022   COLONOSCOPY     PARTIAL HYSTERECTOMY  1995   due to heavy periods    Medications:  Current Outpatient Medications on File Prior to Visit  Medication Sig   albuterol (  VENTOLIN HFA) 108 (90 Base) MCG/ACT inhaler INHALE 2 PUFFS INTO THE LUNGS EVERY 6 HOURS AS NEEDED FOR WHEEZING OR SHORTNESS OF BREATH   anastrozole (ARIMIDEX) 1 MG tablet Take 1 tablet (1 mg total) by mouth daily.   aspirin 81 MG EC tablet Take 81 mg by mouth daily.   b complex vitamins capsule Take 1 capsule by mouth daily.   Calcium-Magnesium-Vitamin D (CALCIUM 1200+D3 PO)    Cholecalciferol (VITAMIN D) 50 MCG (2000 UT) CAPS Take 4,000 Units by mouth daily.   diclofenac Sodium (VOLTAREN) 1 % GEL APPLY 2 GRAMS TOPICALLY TO THE AFFECTED AREA FOUR TIMES DAILY AS NEEDED   diphenhydrAMINE (BENADRYL) 25 MG tablet Take 25 mg by mouth daily.   fluticasone (FLONASE) 50 MCG/ACT nasal spray SHAKE LIQUID AND USE 2 SPRAYS IN EACH NOSTRIL DAILY   Garlic (GARLIQUE PO) Take 1 tablet by mouth daily.   Multiple Vitamin (MULTIVITAMIN) tablet Take 1 tablet by mouth daily.   nystatin ointment (MYCOSTATIN) APPLY TOPICALLY TO THE AFFECTED AREA THREE TIMES DAILY   Omega-3 Fatty Acids (FISH OIL PO) Take 1 capsule by mouth daily.   No current facility-administered medications on file prior to visit.    Allergies:  Allergies  Allergen Reactions   Penicillins Hives    Social History:  Social History   Socioeconomic History   Marital status: Married    Spouse name: Charles   Number of children: 4   Years of education: Not on file   Highest education level: Not on file  Occupational History   Not on file  Tobacco  Use   Smoking status: Former    Current packs/day: 0.00    Average packs/day: 0.8 packs/day for 40.0 years (30.0 ttl pk-yrs)    Types: Cigarettes    Start date: 09/28/1974    Quit date: 09/28/2014    Years since quitting: 9.0   Smokeless tobacco: Never   Tobacco comments:    Already quit  Vaping Use   Vaping status: Former  Substance and Sexual Activity   Alcohol use: No   Drug use: No   Sexual activity: Not Currently    Birth control/protection: None  Other Topics Concern   Not on file  Social History Narrative   Not on file   Social Drivers of Health   Financial Resource Strain: Low Risk  (10/14/2022)   Overall Financial Resource Strain (CARDIA)    Difficulty of Paying Living Expenses: Not hard at all  Food Insecurity: No Food Insecurity (10/14/2022)   Hunger Vital Sign    Worried About Running Out of Food in the Last Year: Never true    Ran Out of Food in the Last Year: Never true  Transportation Needs: No Transportation Needs (10/14/2022)   PRAPARE - Administrator, Civil Service (Medical): No    Lack of Transportation (Non-Medical): No  Physical Activity: Insufficiently Active (10/14/2022)   Exercise Vital Sign    Days of Exercise per Week: 2 days    Minutes of Exercise per Session: 30 min  Stress: No Stress Concern Present (10/14/2022)   [employer] of Occupational Health - Occupational Stress Questionnaire    Feeling of Stress : Not at all  Social Connections: Socially Integrated (10/14/2022)   Social Connection and Isolation Panel [NHANES]    Frequency of Communication with Friends and Family: More than three times a week    Frequency of Social Gatherings with Friends and Family: More than three times a week    Attends  Religious Services: More than 4 times per year    Active Member of Clubs or Organizations: Yes    Attends Banker Meetings: Never    Marital Status: Married  Catering manager Violence: Not At Risk (10/14/2022)    Humiliation, Afraid, Rape, and Kick questionnaire    Fear of Current or Ex-Partner: No    Emotionally Abused: No    Physically Abused: No    Sexually Abused: No   Social History   Tobacco Use  Smoking Status Former   Current packs/day: 0.00   Average packs/day: 0.8 packs/day for 40.0 years (30.0 ttl pk-yrs)   Types: Cigarettes   Start date: 09/28/1974   Quit date: 09/28/2014   Years since quitting: 9.0  Smokeless Tobacco Never  Tobacco Comments   Already quit   Social History   Substance and Sexual Activity  Alcohol Use No    Family History:  Family History  Problem Relation Age of Onset   Thyroid disease Mother    Asthma Mother    Heart disease Mother    Obesity Mother    Atrial fibrillation Mother    Osteoarthritis Father    CAD Father    Heart disease Father    Obesity Father    Diabetes Maternal Uncle    Obesity Maternal Uncle    Stroke Paternal Aunt    Diabetes Maternal Grandfather    Tuberculosis Maternal Grandfather    Obesity Maternal Grandfather    Diabetes Paternal Grandmother    Obesity Paternal Grandmother    Heart attack Paternal Grandfather    Heart disease Paternal Grandfather    Obesity Paternal Grandfather    Breast cancer Other        great grandmother   Past medical history, surgical history, medications, allergies, family history and social history reviewed with patient today and changes made to appropriate areas of the chart.   ROS All other ROS negative except what is listed above and in the HPI.      Objective:    BP 136/70 (BP Location: Left Arm, Patient Position: Sitting, Cuff Size: Large)   Pulse 86   Temp (!) 97.4 F (36.3 C) (Oral)   Ht 5' 0.6" (1.539 m)   Wt 251 lb 12.8 oz (114.2 kg)   LMP  (LMP Unknown)   SpO2 98%   BMI 48.21 kg/m   Wt Readings from Last 3 Encounters:  10/19/23 251 lb 12.8 oz (114.2 kg)  09/01/23 248 lb (112.5 kg)  06/02/23 252 lb (114.3 kg)    Physical Exam Vitals and nursing note reviewed. Exam  conducted with a chaperone present.  Constitutional:      General: She is awake. She is not in acute distress.    Appearance: She is well-developed and well-groomed. She is obese. She is not ill-appearing or toxic-appearing.  HENT:     Head: Normocephalic and atraumatic.     Right Ear: Hearing, tympanic membrane, ear canal and external ear normal. No drainage.     Left Ear: Hearing, tympanic membrane, ear canal and external ear normal. No drainage.     Nose: Nose normal.     Right Sinus: No maxillary sinus tenderness or frontal sinus tenderness.     Left Sinus: No maxillary sinus tenderness or frontal sinus tenderness.     Mouth/Throat:     Mouth: Mucous membranes are moist.     Pharynx: Oropharynx is clear. Uvula midline. No pharyngeal swelling, oropharyngeal exudate or posterior oropharyngeal erythema.  Eyes:  General: Lids are normal.        Right eye: No discharge.        Left eye: No discharge.     Extraocular Movements: Extraocular movements intact.     Conjunctiva/sclera: Conjunctivae normal.     Pupils: Pupils are equal, round, and reactive to light.     Visual Fields: Right eye visual fields normal and left eye visual fields normal.  Neck:     Thyroid: No thyromegaly.     Vascular: No carotid bruit.     Trachea: Trachea normal.  Cardiovascular:     Rate and Rhythm: Normal rate and regular rhythm.     Heart sounds: Normal heart sounds. No murmur heard.    No gallop.  Pulmonary:     Effort: Pulmonary effort is normal. No accessory muscle usage or respiratory distress.     Breath sounds: Normal breath sounds.  Chest:     Comments: Deferred today, upcoming mammogram. Abdominal:     General: Bowel sounds are normal.     Palpations: Abdomen is soft. There is no hepatomegaly or splenomegaly.     Tenderness: There is no abdominal tenderness.  Musculoskeletal:        General: Normal range of motion.     Cervical back: Normal range of motion and neck supple.     Right  lower leg: No edema.     Left lower leg: No edema.  Lymphadenopathy:     Head:     Right side of head: No submental, submandibular, tonsillar, preauricular or posterior auricular adenopathy.     Left side of head: No submental, submandibular, tonsillar, preauricular or posterior auricular adenopathy.     Cervical: No cervical adenopathy.  Skin:    General: Skin is warm and dry.     Capillary Refill: Capillary refill takes less than 2 seconds.     Findings: No rash.  Neurological:     Mental Status: She is alert and oriented to person, place, and time.     Gait: Gait is intact.     Deep Tendon Reflexes: Reflexes are normal and symmetric.     Reflex Scores:      Brachioradialis reflexes are 2+ on the right side and 2+ on the left side.      Patellar reflexes are 2+ on the right side and 2+ on the left side. Psychiatric:        Attention and Perception: Attention normal.        Mood and Affect: Mood normal.        Speech: Speech normal.        Behavior: Behavior normal. Behavior is cooperative.        Thought Content: Thought content normal.        Judgment: Judgment normal.    Results for orders placed or performed in visit on 09/17/23  CMP (Cancer Center only)   Collection Time: 09/17/23 11:04 AM  Result Value Ref Range   Sodium 138 135 - 145 mmol/L   Potassium 3.8 3.5 - 5.1 mmol/L   Chloride 103 98 - 111 mmol/L   CO2 25 22 - 32 mmol/L   Glucose, Bld 101 (H) 70 - 99 mg/dL   BUN 14 8 - 23 mg/dL   Creatinine 1.61 (H) 0.96 - 1.00 mg/dL   Calcium 9.1 8.9 - 04.5 mg/dL   Total Protein 7.0 6.5 - 8.1 g/dL   Albumin 3.7 3.5 - 5.0 g/dL   AST 23 15 - 41 U/L  ALT 32 0 - 44 U/L   Alkaline Phosphatase 64 38 - 126 U/L   Total Bilirubin 0.5 <1.2 mg/dL   GFR, Estimated 49 (L) >60 mL/min   Anion gap 10 5 - 15  CBC with Differential (Cancer Center Only)   Collection Time: 09/17/23 11:04 AM  Result Value Ref Range   WBC Count 7.8 4.0 - 10.5 K/uL   RBC 4.36 3.87 - 5.11 MIL/uL    Hemoglobin 14.1 12.0 - 15.0 g/dL   HCT 16.1 09.6 - 04.5 %   MCV 93.8 80.0 - 100.0 fL   MCH 32.3 26.0 - 34.0 pg   MCHC 34.5 30.0 - 36.0 g/dL   RDW 40.9 81.1 - 91.4 %   Platelet Count 271 150 - 400 K/uL   nRBC 0.0 0.0 - 0.2 %   Neutrophils Relative % 57 %   Neutro Abs 4.5 1.7 - 7.7 K/uL   Lymphocytes Relative 32 %   Lymphs Abs 2.5 0.7 - 4.0 K/uL   Monocytes Relative 7 %   Monocytes Absolute 0.5 0.1 - 1.0 K/uL   Eosinophils Relative 2 %   Eosinophils Absolute 0.2 0.0 - 0.5 K/uL   Basophils Relative 1 %   Basophils Absolute 0.1 0.0 - 0.1 K/uL   Immature Granulocytes 1 %   Abs Immature Granulocytes 0.04 0.00 - 0.07 K/uL      Assessment & Plan:   Problem List Items Addressed This Visit       Cardiovascular and Mediastinum   Aortic atherosclerosis (HCC)   Ongoing. Lung screening CT, along with 3 vessel coronary artery disease.  Educated patient on this and recommend continue statin daily +  ASA 81 MG daily for prevention.  Continue cessation of smoking.  Focus on healthy diet and regular exercise.      Relevant Medications   amLODipine (NORVASC) 2.5 MG tablet   atorvastatin (LIPITOR) 40 MG tablet   losartan (COZAAR) 100 MG tablet   Other Relevant Orders   Comprehensive metabolic panel   Lipid Panel w/o Chol/HDL Ratio   Essential hypertension   Chronic, stable.  BP trending down on recheck.  Educated on DASH diet and recommend checking BP at home daily and documenting for provider visits. Continue Losartan daily, which is beneficial for proteinuria (47 January 2024) and continue 2.5 MG Amlodipine daily, educated her on these.  Avoid ACE due to underlying lung disease. Scheduled to see nephrology. LABS: CBC, TSH, CMP, urine ALB. Return in 6 months.      Relevant Medications   amLODipine (NORVASC) 2.5 MG tablet   atorvastatin (LIPITOR) 40 MG tablet   losartan (COZAAR) 100 MG tablet   Other Relevant Orders   Microalbumin, Urine Waived   CBC with Differential/Platelet    Comprehensive metabolic panel   TSH     Respiratory   Centrilobular emphysema (HCC)   Chronic, ongoing.  FEV1 62% and FEV1/FVC 81% in February 2023.  Continue Anoro + continue Albuterol as needed, discussed at length with patient.  Continue lung screening annually, as past smoker with 30 year smoking history.  Refills as needed.  Spirometry next visit.      Relevant Medications   umeclidinium-vilanterol (ANORO ELLIPTA) 62.5-25 MCG/ACT AEPB     Genitourinary   Stage 3a chronic kidney disease (HCC) (Chronic)   Ongoing with urine ALB 47 January 2024, continue ARB for kidney protection.  Recheck CMP today.  Avoid ACE due to COPD.  Nephrology referral placed.      Relevant Orders  Microalbumin, Urine Waived   Comprehensive metabolic panel     Other   Invasive carcinoma of breast (HCC) - Primary (Chronic)   Ongoing.  Continue collaboration with oncology at this time.  Recent notes reviewed.      Depression   Chronic, stable on Wellbutrin.  Continue current regimen and adjust as needed.  Denies SI/HI.  Refills as needed.      Relevant Medications   buPROPion (WELLBUTRIN XL) 300 MG 24 hr tablet   Hyperlipidemia   Chronic, stable.  Continue current medication regimen and adjust as needed.  Lipid panel today.      Relevant Medications   amLODipine (NORVASC) 2.5 MG tablet   atorvastatin (LIPITOR) 40 MG tablet   losartan (COZAAR) 100 MG tablet   Other Relevant Orders   Comprehensive metabolic panel   Lipid Panel w/o Chol/HDL Ratio   Mass of urinary bladder   Noted on imaging, continue collaboration with urology, recent note reviewed.      Morbid obesity (HCC)   BMI 48.21.  Recommended eating smaller high protein, low fat meals more frequently and exercising 30 mins a day 5 times a week with a goal of 10-15lb weight loss in the next 3 months. Patient voiced their understanding and motivation to adhere to these recommendations.       Vitamin D deficiency   Chronic.  Continue  daily supplement and adjust as needed.  Check Vit D level today.      Relevant Orders   VITAMIN D 25 Hydroxy (Vit-D Deficiency, Fractures)   Other Visit Diagnoses       Flu vaccine need       Flu vaccine, educated patient.   Relevant Orders   Flu vaccine trivalent PF, 6mos and older(Flulaval,Afluria,Fluarix,Fluzone) (Completed)     Encounter for annual physical exam       Annual physical today with labs and health maintenance reviewed, discussed with patient.        Follow up plan: Return in about 6 months (around 04/17/2024) for HTN/HLD, COPD, MOOD, BREAST CA.   LABORATORY TESTING:  - Pap smear: not applicable  IMMUNIZATIONS:   - Tdap: Tetanus vaccination status reviewed: Up To Date - Influenza: Provided today - Pneumovax: Up to date - Prevnar: Not applicable - COVID: Up to date - HPV: Not applicable - Shingrix vaccine: Up to date  SCREENING: -Mammogram: scheduled on 11/26/23 - Colonoscopy: Refused  - Bone Density: Up to date -- normal, repeat in 10 years -Hearing Test: Not applicable  -Spirometry: Up To Date  PATIENT COUNSELING:   Advised to take 1 mg of folate supplement per day if capable of pregnancy.   Sexuality: Discussed sexually transmitted diseases, partner selection, use of condoms, avoidance of unintended pregnancy  and contraceptive alternatives.   Advised to avoid cigarette smoking.  I discussed with the patient that most people either abstain from alcohol or drink within safe limits (<=14/week and <=4 drinks/occasion for males, <=7/weeks and <= 3 drinks/occasion for females) and that the risk for alcohol disorders and other health effects rises proportionally with the number of drinks per week and how often a drinker exceeds daily limits.  Discussed cessation/primary prevention of drug use and availability of treatment for abuse.   Diet: Encouraged to adjust caloric intake to maintain  or achieve ideal body weight, to reduce intake of dietary saturated  fat and total fat, to limit sodium intake by avoiding high sodium foods and not adding table salt, and to maintain adequate dietary potassium and  calcium preferably from fresh fruits, vegetables, and low-fat dairy products.    Stressed the importance of regular exercise  Injury prevention: Discussed safety belts, safety helmets, smoke detector, smoking near bedding or upholstery.   Dental health: Discussed importance of regular tooth brushing, flossing, and dental visits.    NEXT PREVENTATIVE PHYSICAL DUE IN 1 YEAR. Return in about 6 months (around 04/17/2024) for HTN/HLD, COPD, MOOD, BREAST CA.

## 2023-10-20 ENCOUNTER — Encounter: Payer: Self-pay | Admitting: Nurse Practitioner

## 2023-10-20 LAB — CBC WITH DIFFERENTIAL/PLATELET
Basophils Absolute: 0.1 10*3/uL (ref 0.0–0.2)
Basos: 1 %
EOS (ABSOLUTE): 0.2 10*3/uL (ref 0.0–0.4)
Eos: 2 %
Hematocrit: 43.4 % (ref 34.0–46.6)
Hemoglobin: 14.8 g/dL (ref 11.1–15.9)
Immature Grans (Abs): 0 10*3/uL (ref 0.0–0.1)
Immature Granulocytes: 0 %
Lymphocytes Absolute: 3 10*3/uL (ref 0.7–3.1)
Lymphs: 30 %
MCH: 32.3 pg (ref 26.6–33.0)
MCHC: 34.1 g/dL (ref 31.5–35.7)
MCV: 95 fL (ref 79–97)
Monocytes Absolute: 0.6 10*3/uL (ref 0.1–0.9)
Monocytes: 6 %
Neutrophils Absolute: 6.2 10*3/uL (ref 1.4–7.0)
Neutrophils: 61 %
Platelets: 358 10*3/uL (ref 150–450)
RBC: 4.58 x10E6/uL (ref 3.77–5.28)
RDW: 12.3 % (ref 11.7–15.4)
WBC: 10.1 10*3/uL (ref 3.4–10.8)

## 2023-10-20 LAB — COMPREHENSIVE METABOLIC PANEL
ALT: 33 [IU]/L — ABNORMAL HIGH (ref 0–32)
AST: 26 [IU]/L (ref 0–40)
Albumin: 4.2 g/dL (ref 3.9–4.9)
Alkaline Phosphatase: 99 [IU]/L (ref 44–121)
BUN/Creatinine Ratio: 12 (ref 12–28)
BUN: 15 mg/dL (ref 8–27)
Bilirubin Total: 0.4 mg/dL (ref 0.0–1.2)
CO2: 21 mmol/L (ref 20–29)
Calcium: 10 mg/dL (ref 8.7–10.3)
Chloride: 98 mmol/L (ref 96–106)
Creatinine, Ser: 1.26 mg/dL — ABNORMAL HIGH (ref 0.57–1.00)
Globulin, Total: 2.7 g/dL (ref 1.5–4.5)
Glucose: 86 mg/dL (ref 70–99)
Potassium: 4.2 mmol/L (ref 3.5–5.2)
Sodium: 137 mmol/L (ref 134–144)
Total Protein: 6.9 g/dL (ref 6.0–8.5)
eGFR: 48 mL/min/{1.73_m2} — ABNORMAL LOW (ref >59)

## 2023-10-20 LAB — LIPID PANEL W/O CHOL/HDL RATIO
Cholesterol, Total: 168 mg/dL (ref 100–199)
HDL: 37 mg/dL — ABNORMAL LOW (ref >39)
LDL Chol Calc (NIH): 95 mg/dL (ref 0–99)
Triglycerides: 213 mg/dL — ABNORMAL HIGH (ref 0–149)
VLDL Cholesterol Cal: 36 mg/dL (ref 5–40)

## 2023-10-20 LAB — VITAMIN D 25 HYDROXY (VIT D DEFICIENCY, FRACTURES): Vit D, 25-Hydroxy: 36.5 ng/mL (ref 30.0–100.0)

## 2023-10-20 LAB — TSH: TSH: 3.5 u[IU]/mL (ref 0.450–4.500)

## 2023-10-20 MED ORDER — ATORVASTATIN CALCIUM 80 MG PO TABS: 80.0000 mg | ORAL_TABLET | Freq: Every day | ORAL | 3 refills | Status: AC

## 2023-10-20 NOTE — Progress Notes (Signed)
Contacted via MyChart The 10-year ASCVD risk score (Arnett DK, et al., 2019) is: 8.2%   Values used to calculate the score:     Age: 63 years     Sex: Female     Is Non-Hispanic African American: No     Diabetic: No     Tobacco smoker: No     Systolic Blood Pressure: 136 mmHg     Is BP treated: Yes     HDL Cholesterol: 37 mg/dL     Total Cholesterol: 168 mg/dL   Good afternoon Drenda Freeze, your labs have returned: - Kidney function, creatinine and eGFR, continues to show stable chronic kidney disease stage 3a.  We will continue to monitor. Liver function overall stable, ALT and AST, only mild elevation in ALT. - Lipid panel shows LDL above goal, I would recommend increasing Atorvastatin to 80 MG.  Would you be okay with this? Let me know.  Any questions? Keep being stellar!!  Thank you for allowing me to participate in your care.  I appreciate you. Kindest regards, Ketzia Guzek

## 2023-10-27 ENCOUNTER — Encounter: Payer: Self-pay | Admitting: Urology

## 2023-10-27 ENCOUNTER — Ambulatory Visit: Payer: Federal, State, Local not specified - PPO | Admitting: Urology

## 2023-10-27 ENCOUNTER — Other Ambulatory Visit: Payer: Self-pay

## 2023-10-27 VITALS — BP 187/90 | HR 111 | Ht 60.0 in | Wt 247.0 lb

## 2023-10-27 DIAGNOSIS — N210 Calculus in bladder: Secondary | ICD-10-CM

## 2023-10-27 DIAGNOSIS — R21 Rash and other nonspecific skin eruption: Secondary | ICD-10-CM | POA: Diagnosis not present

## 2023-10-27 MED ORDER — CLOTRIMAZOLE-BETAMETHASONE 1-0.05 % EX CREA: 1.0000 | TOPICAL_CREAM | Freq: Two times a day (BID) | CUTANEOUS | 0 refills | Status: AC

## 2023-10-27 NOTE — Progress Notes (Unsigned)
Surgical Physician Order Form [facility]  Dr. Legrand Rams, MD  * Scheduling expectation : Next Available  *Length of Case: 1 hour  *Clearance needed: no  *Anticoagulation Instructions: Hold all anticoagulants  *Aspirin Instructions: Ok to continue Aspirin  *Post-op visit Date/Instructions:  tbd  *Diagnosis: Bladder Stone  *Procedure:  Cystolitholapaxy <2.5cm (40981)   Additional orders: N/A  -Admit type: OUTpatient  -Anesthesia: General  -VTE Prophylaxis Standing Order SCD's       Other:   -Standing Lab Orders Per Anesthesia    Lab other: UA&Urine Culture  -Standing Test orders EKG/Chest x-ray per Anesthesia       Test other:   - Medications:  Cipro 400mg  IV  -Other orders:  na

## 2023-10-27 NOTE — Progress Notes (Signed)
Cystoscopy Procedure Note:  Indication: Possible bladder mass  After informed consent and discussion of the procedure and its risks, Dorothy Cunningham was positioned and prepped in the standard fashion. Cystoscopy was performed with a flexible cystoscope. The urethra, bladder neck and entire bladder was visualized in a standard fashion. The ureteral orifices were visualized in their normal location and orientation.  There was a ~2cm stone near the bladder neck.  Unclear if this was free-floating, related to a ureterocele, or growth on foreign body in the bladder.  Imaging: CT with atrophic left kidney, 2 cm bladder stone  Findings: 2 cm bladder stone  -------------------------------------------------------  Assessment and Plan: 2 cm bladder stone on CT and confirmed on clinic cystoscopy today, no definite bladder tumors seen.  We discussed this could represent a free-floating stone, calcification from foreign body material from her prior sling procedure, ureteral/ureterocele stone that could have caused her atrophic kidney and then passed into the bladder.  We reviewed options including observation or removal with cystolitholapaxy.  She opts for removal.  Risk and benefits discussed including bleeding, infection, recurrence.  Trial of Lotrisone cream for her groin rash, encouraged to see PCP for further evaluation Schedule cystolitholapaxy  Legrand Rams, MD 10/27/2023

## 2023-11-04 ENCOUNTER — Encounter: Payer: Self-pay | Admitting: Nurse Practitioner

## 2023-11-04 MED ORDER — ANORO ELLIPTA 62.5-25 MCG/ACT IN AEPB: INHALATION_SPRAY | RESPIRATORY_TRACT | 5 refills | Status: DC

## 2023-11-04 MED ORDER — FLUTICASONE PROPIONATE 50 MCG/ACT NA SUSP: NASAL | 2 refills | Status: DC

## 2023-11-08 ENCOUNTER — Telehealth: Payer: Self-pay

## 2023-11-08 NOTE — Telephone Encounter (Signed)
  Per Dr. Estanislao Heimlich, Patient is to be scheduled for Cystolitholapaxy   Dorothy Cunningham was contacted and possible surgical dates were discussed, Friday March 14th, 2025 was agreed upon for surgery.   Patient was instructed that Dr. Estanislao Heimlich will require them to provide a pre-op UA & CX prior to surgery. This was ordered and scheduled drop off appointment was made for 11/26/2023.    Patient was directed to call [phone] between 1-3pm the day before surgery to find out surgical arrival time.  Instructions were given not to eat or drink from midnight on the night before surgery and have a driver for the day of surgery. On the surgery day patient was instructed to enter through the [facility] entrance of [facility] report the Same Day Surgery desk.   Pre-Admit Testing will be in contact via phone to set up an interview with the anesthesia team to review your history and medications prior to surgery.   Reminder of this information was sent via MyChart to the patient.

## 2023-11-15 ENCOUNTER — Encounter: Payer: Self-pay | Admitting: Nurse Practitioner

## 2023-11-17 ENCOUNTER — Encounter: Payer: Self-pay | Admitting: Nurse Practitioner

## 2023-11-17 ENCOUNTER — Telehealth: Payer: Federal, State, Local not specified - PPO | Admitting: Nurse Practitioner

## 2023-11-17 DIAGNOSIS — J432 Centrilobular emphysema: Secondary | ICD-10-CM

## 2023-11-17 NOTE — Assessment & Plan Note (Signed)
Chronic, ongoing.  FEV1 62% and FEV1/FVC 81% in February 2023.  Continue Anoro + continue Albuterol as needed, discussed at length with patient.  Continue lung screening annually, as past smoker with 30 year smoking history.  Refills as needed.  Spirometry next visit.  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

## 2023-11-17 NOTE — Progress Notes (Signed)
LMP  (LMP Unknown)    Subjective:    Patient ID: Dorothy Cunningham, female    DOB: [birth year], 63 y.o.   MRN: [id number]  HPI: Dorothy Cunningham is a 63 y.o. female  Chief Complaint  Patient presents with   Accomodation Forms    Patient states she was recently told she would have to go back into the office to work 5 days per week. Patient states that this very difficult for her as she has a 40 mile drive to work one way. States this makes her very stiff and not able to move without pain. States the walk from the handicap parking is to long as it flares up her lung problems and causes SOB.    Virtual Visit via Video Note  I connected with Dorothy Cunningham on 11/17/23 at  3:40 PM EST by a video enabled telemedicine application and verified that I am speaking with the correct person using two identifiers.  Location: Patient: home Provider: home   I discussed the limitations of evaluation and management by telemedicine and the availability of in person appointments. The patient expressed understanding and agreed to proceed.  I discussed the assessment and treatment plan with the patient. The patient was provided an opportunity to ask questions and all were answered. The patient agreed with the plan and demonstrated an understanding of the instructions.   The patient was advised to call back or seek an in-person evaluation if the symptoms worsen or if the condition fails to improve as anticipated.  I provided 25 minutes of non-face-to-face time during this encounter.   Marjie Skiff, NP   COPD Visit today to discuss accomodation papers for work.  Uses Anoro daily and Albuterol as needed.  Quit smoking >15 years ago, smoked cigarettes for 30 years, started at age 58,  about 1 PPD.  Emphysema and aortic atherosclerosis noted on lung cancer screening.  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  This is a long drive for her >91 miles to [place], which would cause stiffness and pain due to being in car for the longer period. The walk from parking lot, handicap area, she has to walk up a large incline and this causes lung flares and SOB with her COPD.  COPD status: stable Satisfied with current treatment?: yes Oxygen use: no Dyspnea frequency: with exertion for longer period Cough frequency: rarely Rescue inhaler frequency:  rarely Limitation of activity: no Productive cough: none Last Spirometry: 11/17/21 Pneumovax: Up to Date Influenza: Up to Date  Relevant past medical, surgical, family and social history reviewed and updated as indicated. Interim medical history since our last visit reviewed. Allergies and medications reviewed and updated.  Review of Systems  Constitutional:  Negative for activity change, appetite change, diaphoresis, fatigue and fever.  Respiratory:  Positive for shortness of breath (with a lot of exertion for longer period). Negative for cough, chest tightness and wheezing.   Cardiovascular:  Negative for chest pain, palpitations and leg swelling.  Gastrointestinal: Negative.   Neurological: Negative.   Psychiatric/Behavioral: Negative.     Per HPI unless specifically indicated above     Objective:    LMP  (LMP Unknown)   Wt Readings from Last 3 Encounters:  10/27/23 247 lb (112 kg)  10/19/23 251 lb 12.8 oz (114.2 kg)  09/01/23 248 lb (112.5 kg)    Physical Exam Vitals and nursing  note reviewed.  Constitutional:      General: She is awake. She is not in acute distress.    Appearance: She is well-developed and well-groomed. She is obese. She is not ill-appearing or toxic-appearing.  HENT:     Head: Normocephalic.     Right Ear: Hearing normal.     Left Ear: Hearing normal.  Eyes:     General: Lids are normal.        Right eye: No discharge.        Left eye: No discharge.     Conjunctiva/sclera: Conjunctivae normal.   Pulmonary:     Effort: Pulmonary effort is normal. No accessory muscle usage or respiratory distress.  Musculoskeletal:     Cervical back: Normal range of motion.  Neurological:     Mental Status: She is alert and oriented to person, place, and time.  Psychiatric:        Attention and Perception: Attention normal.        Mood and Affect: Mood normal.        Behavior: Behavior normal. Behavior is cooperative.        Thought Content: Thought content normal.        Judgment: Judgment normal.     Results for orders placed or performed in visit on 10/19/23  Microalbumin, Urine Waived   Collection Time: 10/19/23  1:26 PM  Result Value Ref Range   Microalb, Ur Waived 10 0 - 19 mg/L   Creatinine, Urine Waived 50 10 - 300 mg/dL   Microalb/Creat Ratio 30-300 (H) <30 mg/g  CBC with Differential/Platelet   Collection Time: 10/19/23  1:29 PM  Result Value Ref Range   WBC 10.1 3.4 - 10.8 x10E3/uL   RBC 4.58 3.77 - 5.28 x10E6/uL   Hemoglobin 14.8 11.1 - 15.9 g/dL   Hematocrit 69.6 29.5 - 46.6 %   MCV 95 79 - 97 fL   MCH 32.3 26.6 - 33.0 pg   MCHC 34.1 31.5 - 35.7 g/dL   RDW 28.4 13.2 - 44.0 %   Platelets 358 150 - 450 x10E3/uL   Neutrophils 61 Not Estab. %   Lymphs 30 Not Estab. %   Monocytes 6 Not Estab. %   Eos 2 Not Estab. %   Basos 1 Not Estab. %   Neutrophils Absolute 6.2 1.4 - 7.0 x10E3/uL   Lymphocytes Absolute 3.0 0.7 - 3.1 x10E3/uL   Monocytes Absolute 0.6 0.1 - 0.9 x10E3/uL   EOS (ABSOLUTE) 0.2 0.0 - 0.4 x10E3/uL   Basophils Absolute 0.1 0.0 - 0.2 x10E3/uL   Immature Granulocytes 0 Not Estab. %   Immature Grans (Abs) 0.0 0.0 - 0.1 x10E3/uL  Comprehensive metabolic panel   Collection Time: 10/19/23  1:29 PM  Result Value Ref Range   Glucose 86 70 - 99 mg/dL   BUN 15 8 - 27 mg/dL   Creatinine, Ser 1.02 (H) 0.57 - 1.00 mg/dL   eGFR 48 (L) >72 ZD/GUY/4.03   BUN/Creatinine Ratio 12 12 - 28   Sodium 137 134 - 144 mmol/L   Potassium 4.2 3.5 - 5.2 mmol/L   Chloride 98 96  - 106 mmol/L   CO2 21 20 - 29 mmol/L   Calcium 10.0 8.7 - 10.3 mg/dL   Total Protein 6.9 6.0 - 8.5 g/dL   Albumin 4.2 3.9 - 4.9 g/dL   Globulin, Total 2.7 1.5 - 4.5 g/dL   Bilirubin Total 0.4 0.0 - 1.2 mg/dL   Alkaline Phosphatase 99 44 - 121 IU/L  AST 26 0 - 40 IU/L   ALT 33 (H) 0 - 32 IU/L  Lipid Panel w/o Chol/HDL Ratio   Collection Time: 10/19/23  1:29 PM  Result Value Ref Range   Cholesterol, Total 168 100 - 199 mg/dL   Triglycerides 161 (H) 0 - 149 mg/dL   HDL 37 (L) >09 mg/dL   VLDL Cholesterol Cal 36 5 - 40 mg/dL   LDL Chol Calc (NIH) 95 0 - 99 mg/dL  TSH   Collection Time: 10/19/23  1:29 PM  Result Value Ref Range   TSH 3.500 0.450 - 4.500 uIU/mL  VITAMIN D 25 Hydroxy (Vit-D Deficiency, Fractures)   Collection Time: 10/19/23  1:29 PM  Result Value Ref Range   Vit D, 25-Hydroxy 36.5 30.0 - 100.0 ng/mL      Assessment & Plan:   Problem List Items Addressed This Visit       Respiratory   Centrilobular emphysema (HCC) - Primary   Chronic, ongoing.  FEV1 62% and FEV1/FVC 81% in February 2023.  Continue Anoro + continue Albuterol as needed, discussed at length with patient.  Continue lung screening annually, as past smoker with 30 year smoking history.  Refills as needed.  Spirometry next visit.  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        Follow up plan: Return if symptoms worsen or fail to improve.

## 2023-11-17 NOTE — Telephone Encounter (Signed)
 Mychart video appt scheduled.

## 2023-11-17 NOTE — Patient Instructions (Signed)

## 2023-11-26 ENCOUNTER — Ambulatory Visit
Admission: RE | Admit: 2023-11-26 | Discharge: 2023-11-26 | Disposition: A | Discharge status: Home / Self Care | Payer: Federal, State, Local not specified - PPO | Source: Ambulatory Visit | Attending: Oncology | Admitting: Oncology

## 2023-11-26 ENCOUNTER — Other Ambulatory Visit: Payer: Federal, State, Local not specified - PPO

## 2023-11-26 DIAGNOSIS — C50919 Malignant neoplasm of unspecified site of unspecified female breast: Secondary | ICD-10-CM | POA: Diagnosis present

## 2023-11-26 DIAGNOSIS — N210 Calculus in bladder: Secondary | ICD-10-CM

## 2023-11-26 HISTORY — DX: Personal history of irradiation: Z92.3

## 2023-11-26 LAB — URINALYSIS, COMPLETE
Bilirubin, UA: NEGATIVE
Glucose, UA: NEGATIVE
Ketones, UA: NEGATIVE
Nitrite, UA: NEGATIVE
Protein,UA: NEGATIVE
Specific Gravity, UA: 1.02 (ref 1.005–1.030)
Urobilinogen, Ur: 0.2 mg/dL (ref 0.2–1.0)
pH, UA: 6 (ref 5.0–7.5)

## 2023-11-26 LAB — MICROSCOPIC EXAMINATION
Epithelial Cells (non renal): >10 /[HPF] — AB (ref 0–10)
WBC, UA: >30 /[HPF] — AB (ref 0–5)

## 2023-11-29 ENCOUNTER — Other Ambulatory Visit: Payer: Self-pay | Admitting: Oncology

## 2023-11-29 DIAGNOSIS — R928 Other abnormal and inconclusive findings on diagnostic imaging of breast: Secondary | ICD-10-CM

## 2023-11-29 DIAGNOSIS — N63 Unspecified lump in unspecified breast: Secondary | ICD-10-CM

## 2023-11-29 LAB — CULTURE, URINE COMPREHENSIVE

## 2023-12-01 ENCOUNTER — Encounter: Payer: Self-pay | Admitting: Urology

## 2023-12-01 ENCOUNTER — Encounter
Admission: RE | Admit: 2023-12-01 | Discharge: 2023-12-01 | Disposition: A | Discharge status: Home / Self Care | Payer: Federal, State, Local not specified - PPO | Source: Ambulatory Visit | Attending: Urology | Admitting: Urology

## 2023-12-01 ENCOUNTER — Other Ambulatory Visit: Payer: Self-pay

## 2023-12-01 DIAGNOSIS — I10 Essential (primary) hypertension: Secondary | ICD-10-CM

## 2023-12-01 HISTORY — DX: Pneumonia, unspecified organism: J18.9

## 2023-12-01 HISTORY — DX: Malignant neoplasm of unspecified site of unspecified female breast: C50.919

## 2023-12-01 HISTORY — DX: Other specified diseases of gallbladder: K82.8

## 2023-12-01 HISTORY — DX: Personal history of urinary calculi: Z87.442

## 2023-12-01 HISTORY — DX: Dyspnea, unspecified: R06.00

## 2023-12-01 NOTE — Patient Instructions (Addendum)
 Your procedure is scheduled on: 12/10/23 - Friday Report to the Registration Desk on the 1st floor of the Medical Mall. To find out your arrival time, please call [phone] between 1PM - 3PM on: 12/09/23 - Thursday If your arrival time is 6:00 am, do not arrive before that time as the Medical Mall entrance doors do not open until 6:00 am.  REMEMBER: Instructions that are not followed completely may result in serious medical risk, up to and including death; or upon the discretion of your surgeon and anesthesiologist your surgery may need to be rescheduled.  Do not eat food or drink any liquids after midnight the night before surgery.  No gum chewing or hard candies.  One week prior to surgery: Stop Anti-inflammatories (NSAIDS) such as diclofenac Sodium , Advil, Aleve, Ibuprofen, Motrin, Naproxen, Naprosyn and Aspirin based products such as Excedrin, Goody's Powder, BC Powder. You may take Tylenol if needed for pain up until the day of surgery.  Stop ANY OVER THE COUNTER supplements until after surgery : B complex ,Calcium-Magnesium-Vitamin D ,Garlic ,MULTIVITAMIN ,Omega-3 Fatty Acids .  HOLD losartan on the day of surgery.  ON THE DAY OF SURGERY ONLY TAKE THESE MEDICATIONS WITH SIPS OF WATER:  amLODipine (NORVASC)  anastrozole (ARIMIDEX)  atorvastatin (LIPITOR)  buPROPion (WELLBUTRIN XL)  fluticasone (FLONASE)  ANORO ELLIPTA  Use inhalers albuterol (VENTOLIN HFA)  on the day of surgery and bring to the hospital.   No Alcohol for 24 hours before or after surgery.  No Smoking including e-cigarettes for 24 hours before surgery.  No chewable tobacco products for at least 6 hours before surgery.  No nicotine patches on the day of surgery.  Do not use any "recreational" drugs for at least a week (preferably 2 weeks) before your surgery.  Please be advised that the combination of cocaine and anesthesia may have negative outcomes, up to and including death. If you test positive for  cocaine, your surgery will be cancelled.  On the morning of surgery brush your teeth with toothpaste and water, you may rinse your mouth with mouthwash if you wish. Do not swallow any toothpaste or mouthwash.  Do not wear jewelry, make-up, hairpins, clips or nail polish.  For welded (permanent) jewelry: bracelets, anklets, waist bands, etc.  Please have this removed prior to surgery.  If it is not removed, there is a chance that hospital personnel will need to cut it off on the day of surgery.  Do not wear lotions, powders, or perfumes.   Do not shave body hair from the neck down 48 hours before surgery.  Contact lenses, hearing aids and dentures may not be worn into surgery.  Do not bring valuables to the hospital. [facility] is not responsible for any missing/lost belongings or valuables.   Notify your doctor if there is any change in your medical condition (cold, fever, infection).  Wear comfortable clothing (specific to your surgery type) to the hospital.  After surgery, you can help prevent lung complications by doing breathing exercises.  Take deep breaths and cough every 1-2 hours. Your doctor may order a device called an Incentive Spirometer to help you take deep breaths. When coughing or sneezing, hold a pillow firmly against your incision with both hands. This is called "splinting." Doing this helps protect your incision. It also decreases belly discomfort.  If you are being admitted to the hospital overnight, leave your suitcase in the car. After surgery it may be brought to your room.  In case of  increased patient census, it may be necessary for you, the patient, to continue your postoperative care in the Same Day Surgery department.  If you are being discharged the day of surgery, you will not be allowed to drive home. You will need a responsible individual to drive you home and stay with you for 24 hours after surgery.   If you are taking public transportation, you will  need to have a responsible individual with you.  Please call the Pre-admissions Testing Dept. at [phone] if you have any questions about these instructions.  Surgery Visitation Policy:  Patients having surgery or a procedure may have two visitors.  Children under the age of 67 must have an adult with them who is not the patient.  Temporary Visitor Restrictions Due to increasing cases of flu, RSV and COVID-19: Children ages 38 and under will not be able to visit patients in [facility] hospitals under most circumstances.  Inpatient Visitation:    Visiting hours are 7 a.m. to 8 p.m. Up to four visitors are allowed at one time in a patient room. The visitors may rotate out with other people during the day.  One visitor age 74 or older may stay with the patient overnight and must be in the room by 8 p.m.

## 2023-12-06 ENCOUNTER — Encounter
Admission: RE | Admit: 2023-12-06 | Discharge: 2023-12-06 | Disposition: A | Discharge status: Home / Self Care | Source: Ambulatory Visit | Attending: Urology

## 2023-12-06 DIAGNOSIS — Z01810 Encounter for preprocedural cardiovascular examination: Secondary | ICD-10-CM | POA: Insufficient documentation

## 2023-12-06 DIAGNOSIS — I10 Essential (primary) hypertension: Secondary | ICD-10-CM | POA: Diagnosis not present

## 2023-12-06 DIAGNOSIS — Z01818 Encounter for other preprocedural examination: Secondary | ICD-10-CM | POA: Diagnosis present

## 2023-12-09 ENCOUNTER — Other Ambulatory Visit: Payer: Self-pay | Admitting: Nurse Practitioner

## 2023-12-09 MED ORDER — LACTATED RINGERS IV SOLN: INTRAVENOUS | Status: DC

## 2023-12-09 MED ORDER — CHLORHEXIDINE GLUCONATE 0.12 % MT SOLN: 15.0000 mL | Freq: Once | OROMUCOSAL | Status: AC

## 2023-12-09 MED ORDER — ALBUTEROL SULFATE HFA 108 (90 BASE) MCG/ACT IN AERS: INHALATION_SPRAY | RESPIRATORY_TRACT | 2 refills | Status: DC

## 2023-12-09 MED ORDER — CIPROFLOXACIN IN D5W 400 MG/200ML IV SOLN
400.0000 mg | INTRAVENOUS | Status: AC
  Administered 2023-12-10: 400 mg via INTRAVENOUS

## 2023-12-09 MED ORDER — ORAL CARE MOUTH RINSE
15.0000 mL | Freq: Once | OROMUCOSAL | Status: AC
  Administered 2023-12-10: 15 mL via OROMUCOSAL

## 2023-12-10 ENCOUNTER — Ambulatory Visit
Admission: RE | Admit: 2023-12-10 | Discharge: 2023-12-10 | Disposition: A | Discharge status: Home / Self Care | Payer: Federal, State, Local not specified - PPO | Attending: Urology | Admitting: Urology

## 2023-12-10 ENCOUNTER — Encounter
Admission: RE | Disposition: A | Discharge status: Home / Self Care | Payer: Self-pay | Source: Home / Self Care | Attending: Urology

## 2023-12-10 ENCOUNTER — Other Ambulatory Visit: Payer: Self-pay

## 2023-12-10 ENCOUNTER — Ambulatory Visit: Payer: Self-pay | Admitting: Urgent Care

## 2023-12-10 ENCOUNTER — Ambulatory Visit

## 2023-12-10 ENCOUNTER — Encounter: Payer: Self-pay | Admitting: Urology

## 2023-12-10 ENCOUNTER — Ambulatory Visit: Admitting: General Practice

## 2023-12-10 DIAGNOSIS — G4730 Sleep apnea, unspecified: Secondary | ICD-10-CM | POA: Diagnosis not present

## 2023-12-10 DIAGNOSIS — I129 Hypertensive chronic kidney disease with stage 1 through stage 4 chronic kidney disease, or unspecified chronic kidney disease: Secondary | ICD-10-CM | POA: Insufficient documentation

## 2023-12-10 DIAGNOSIS — Z79899 Other long term (current) drug therapy: Secondary | ICD-10-CM | POA: Diagnosis not present

## 2023-12-10 DIAGNOSIS — Z7951 Long term (current) use of inhaled steroids: Secondary | ICD-10-CM | POA: Insufficient documentation

## 2023-12-10 DIAGNOSIS — Z87891 Personal history of nicotine dependence: Secondary | ICD-10-CM | POA: Insufficient documentation

## 2023-12-10 DIAGNOSIS — Z6841 Body Mass Index (BMI) 40.0 and over, adult: Secondary | ICD-10-CM | POA: Diagnosis not present

## 2023-12-10 DIAGNOSIS — N210 Calculus in bladder: Secondary | ICD-10-CM | POA: Diagnosis present

## 2023-12-10 DIAGNOSIS — N1831 Chronic kidney disease, stage 3a: Secondary | ICD-10-CM | POA: Diagnosis not present

## 2023-12-10 DIAGNOSIS — J439 Emphysema, unspecified: Secondary | ICD-10-CM | POA: Diagnosis not present

## 2023-12-10 HISTORY — PX: CYSTOSCOPY WITH LITHOLAPAXY: SHX1425

## 2023-12-10 SURGERY — CYSTOSCOPY, WITH BLADDER CALCULUS LITHOLAPAXY
Anesthesia: General | Site: Bladder

## 2023-12-10 MED ORDER — SUGAMMADEX SODIUM 200 MG/2ML IV SOLN
INTRAVENOUS | Status: DC | PRN
  Administered 2023-12-10: 200 mg via INTRAVENOUS

## 2023-12-10 MED ORDER — PROPOFOL 10 MG/ML IV BOLUS
INTRAVENOUS | Status: DC | PRN
  Administered 2023-12-10: 150 mg via INTRAVENOUS

## 2023-12-10 MED ORDER — ONDANSETRON HCL 4 MG/2ML IJ SOLN
INTRAMUSCULAR | Status: DC | PRN
  Administered 2023-12-10: 4 mg via INTRAVENOUS

## 2023-12-10 MED ORDER — FENTANYL CITRATE (PF) 100 MCG/2ML IJ SOLN
INTRAMUSCULAR | Status: DC | PRN
Start: 2023-12-10 — End: 2023-12-10
  Administered 2023-12-10 (×2): 50 ug via INTRAVENOUS

## 2023-12-10 MED ORDER — DEXAMETHASONE SODIUM PHOSPHATE 10 MG/ML IJ SOLN
INTRAMUSCULAR | Status: DC | PRN
  Administered 2023-12-10: 10 mg via INTRAVENOUS

## 2023-12-10 MED ORDER — MIDAZOLAM HCL 5 MG/5ML IJ SOLN
INTRAMUSCULAR | Status: DC | PRN
  Administered 2023-12-10: 2 mg via INTRAVENOUS

## 2023-12-10 MED ORDER — ACETAMINOPHEN 10 MG/ML IV SOLN
INTRAVENOUS | Status: DC | PRN
  Administered 2023-12-10: 1000 mg via INTRAVENOUS

## 2023-12-10 MED ORDER — ACETAMINOPHEN 10 MG/ML IV SOLN
INTRAVENOUS | Status: AC
  Filled 2023-12-10: qty 100

## 2023-12-10 MED ORDER — DEXAMETHASONE SODIUM PHOSPHATE 10 MG/ML IJ SOLN: INTRAMUSCULAR | Status: DC | PRN

## 2023-12-10 MED ORDER — ROCURONIUM BROMIDE 100 MG/10ML IV SOLN
INTRAVENOUS | Status: DC | PRN
  Administered 2023-12-10: 50 mg via INTRAVENOUS

## 2023-12-10 MED ORDER — PHENYLEPHRINE 80 MCG/ML (10ML) SYRINGE FOR IV PUSH (FOR BLOOD PRESSURE SUPPORT)
PREFILLED_SYRINGE | INTRAVENOUS | Status: DC | PRN
  Administered 2023-12-10 (×2): 80 ug via INTRAVENOUS

## 2023-12-10 MED ORDER — CIPROFLOXACIN IN D5W 400 MG/200ML IV SOLN
INTRAVENOUS | Status: AC
  Filled 2023-12-10: qty 200

## 2023-12-10 MED ORDER — CHLORHEXIDINE GLUCONATE 0.12 % MT SOLN
OROMUCOSAL | Status: AC
  Filled 2023-12-10: qty 15

## 2023-12-10 MED ORDER — OXYCODONE HCL 5 MG PO TABS: 5.0000 mg | ORAL_TABLET | Freq: Once | ORAL | Status: DC | PRN

## 2023-12-10 MED ORDER — SODIUM CHLORIDE 0.9 % IR SOLN
Status: DC | PRN
  Administered 2023-12-10 (×4): 3000 mL

## 2023-12-10 MED ORDER — OXYCODONE HCL 5 MG/5ML PO SOLN: 5.0000 mg | Freq: Once | ORAL | Status: DC | PRN

## 2023-12-10 MED ORDER — FENTANYL CITRATE (PF) 100 MCG/2ML IJ SOLN
INTRAMUSCULAR | Status: AC
  Filled 2023-12-10: qty 2

## 2023-12-10 MED ORDER — MIDAZOLAM HCL 2 MG/2ML IJ SOLN
INTRAMUSCULAR | Status: AC
  Filled 2023-12-10: qty 2

## 2023-12-10 MED ORDER — FENTANYL CITRATE (PF) 100 MCG/2ML IJ SOLN: 25.0000 ug | INTRAMUSCULAR | Status: DC | PRN

## 2023-12-10 MED ORDER — LIDOCAINE HCL (CARDIAC) PF 100 MG/5ML IV SOSY
PREFILLED_SYRINGE | INTRAVENOUS | Status: DC | PRN
  Administered 2023-12-10: 80 mg via INTRAVENOUS

## 2023-12-10 SURGICAL SUPPLY — 22 items
BAG DRAIN SIEMENS DORNER NS (MISCELLANEOUS) ×1 IMPLANT
BAG URINE DRAIN 2000ML AR STRL (UROLOGICAL SUPPLIES) IMPLANT
BASKET ZERO TIP 1.9FR (BASKET) IMPLANT
CATH FOL 2WAY LX 16X5 (CATHETERS) IMPLANT
CATH FOL 2WAY LX 20X30 (CATHETERS) IMPLANT
CATH SET URETHRAL DILATOR (CATHETERS) IMPLANT
CATH URETL OPEN 5X70 (CATHETERS) IMPLANT
CATH URETL OPEN END 4X70 (CATHETERS) IMPLANT
CNTNR URN SCR LID CUP LEK RST (MISCELLANEOUS) IMPLANT
FIBER LASER MOSES 200 DFL (Laser) IMPLANT
FIBER LASER MOSES 365 DFL (Laser) IMPLANT
FIBER LASER MOSES 550 DFL (Laser) IMPLANT
GLOVE BIOGEL PI IND STRL 7.5 (GLOVE) ×1 IMPLANT
GOWN STRL REUS W/ TWL LRG LVL3 (GOWN DISPOSABLE) ×1 IMPLANT
GOWN STRL REUS W/ TWL XL LVL3 (GOWN DISPOSABLE) ×1 IMPLANT
KIT TURNOVER CYSTO (KITS) ×1 IMPLANT
PACK CYSTO AR (MISCELLANEOUS) ×1 IMPLANT
SET CYSTO W/LG BORE CLAMP LF (SET/KITS/TRAYS/PACK) ×1 IMPLANT
SOL .9 NS 3000ML IRR UROMATIC (IV SOLUTION) ×1 IMPLANT
SYR TOOMEY IRRIG 70ML (MISCELLANEOUS) ×1 IMPLANT
SYRINGE TOOMEY IRRIG 70ML (MISCELLANEOUS) ×1 IMPLANT
WATER STERILE IRR 500ML POUR (IV SOLUTION) ×1 IMPLANT

## 2023-12-10 NOTE — Anesthesia Preprocedure Evaluation (Signed)
 Anesthesia Evaluation  Patient identified by MRN, date of birth, ID band Patient awake    Reviewed: Allergy & Precautions, NPO status , Patient's Chart, lab work & pertinent test results  History of Anesthesia Complications Negative for: history of anesthetic complications  Airway Mallampati: I  TM Distance: >3 FB Neck ROM: full    Dental  (+) Chipped, Dental Advidsory Given   Pulmonary sleep apnea , COPD,  COPD inhaler, former smoker   Pulmonary exam normal        Cardiovascular hypertension, On Medications negative cardio ROS Normal cardiovascular exam     Neuro/Psych  PSYCHIATRIC DISORDERS Anxiety Depression    negative neurological ROS     GI/Hepatic negative GI ROS, Neg liver ROS,,,  Endo/Other    Class 3 obesity  Renal/GU      Musculoskeletal   Abdominal   Peds  Hematology negative hematology ROS (+)   Anesthesia Other Findings Past Medical History: No date: Allergic rhinitis No date: Anxiety 10/2021: Breast cancer, left breast (HCC) No date: Chronic kidney disease, stage 3a (HCC) No date: COPD (chronic obstructive pulmonary disease) (HCC) No date: History of epilepsy     Comment:  as a teenager No date: Hyperlipidemia No date: Hypertension No date: Morbid obesity with BMI of 45.0-49.9, adult (HCC) No date: Sleep apnea  Past Surgical History: 1995: Bladder Tuck 2014: BREAST BIOPSY; Left     Comment:  benign 11/06/2021: BREAST BIOPSY; Left     Comment:  u/s bx  5 o'clock, VENUS clip- INVASIVE MAMMARY               CARCINOMA, 01/26/2022: BREAST LUMPECTOMY WITH SENTINEL LYMPH NODE BIOPSY; Left     Comment:  Procedure: BREAST LUMPECTOMY WITH SENTINEL LYMPH NODE               BX;  Surgeon: Earline Mayotte, MD;  Location: [facility]               ORS;  Service: General;  Laterality: Left; No date: COLONOSCOPY 1995: PARTIAL HYSTERECTOMY     Comment:  due to heavy periods  BMI    Body Mass Index:  48.69 kg/m      Reproductive/Obstetrics negative OB ROS                             Anesthesia Physical Anesthesia Plan  ASA: 3  Anesthesia Plan: General ETT   Post-op Pain Management:    Induction: Intravenous  PONV Risk Score and Plan: 3 and Ondansetron and Dexamethasone  Airway Management Planned: Oral ETT  Additional Equipment:   Intra-op Plan:   Post-operative Plan: Extubation in OR  Informed Consent: I have reviewed the patients History and Physical, chart, labs and discussed the procedure including the risks, benefits and alternatives for the proposed anesthesia with the patient or authorized representative who has indicated his/her understanding and acceptance.     Dental Advisory Given  Plan Discussed with: Anesthesiologist, CRNA and Surgeon  Anesthesia Plan Comments: (Patient consented for risks of anesthesia including but not limited to:  - adverse reactions to medications - damage to eyes, teeth, lips or other oral mucosa - nerve damage due to positioning  - sore throat or hoarseness - Damage to heart, brain, nerves, lungs, other parts of body or loss of life  Patient voiced understanding and assent.)       Anesthesia Quick Evaluation

## 2023-12-10 NOTE — H&P (Signed)
 12/10/23 10:32 AM   Dorothy Cunningham 1960/10/28 [id number]  CC: Bladder stone  HPI: 2 cm bladder stone on CT and confirmed on clinic cystoscopy, no definite bladder tumors seen. We discussed this could represent a free-floating stone, calcification from foreign body material from her prior sling procedure, ureteral/ureterocele stone that could have caused her atrophic kidney and then passed into the bladder. We reviewed options including observation or removal with cystolitholapaxy. She opts for removal. Risk and benefits discussed including bleeding, infection, recurrence.    PMH: Past Medical History:  Diagnosis Date   Allergic rhinitis    Allergy 1963   Penicillin   Anxiety    Arthritis 4 years   Breast cancer, left breast (HCC) 10/2021   Chronic kidney disease, stage 3a (HCC)    COPD (chronic obstructive pulmonary disease) (HCC)    Depression    Dyspnea    Dyspnea    Emphysema of lung (HCC) 5 years   History of epilepsy    as a teenager   History of kidney stones    Hyperlipidemia    Hypertension    Invasive carcinoma of breast (HCC)    Morbid obesity with BMI of 45.0-49.9, adult (HCC)    Personal history of radiation therapy    Pneumonia    Porcelain gallbladder    Seizures (HCC) About 1970   none for 30 + years   Sleep apnea    no CPAP    Surgical History: Past Surgical History:  Procedure Laterality Date   ABDOMINAL HYSTERECTOMY  about 2006   Bladder Tuck  1995   BREAST BIOPSY Left 2014   benign   BREAST BIOPSY Left 11/06/2021   u/s bx  5 o'clock, VENUS clip- INVASIVE MAMMARY CARCINOMA,   BREAST LUMPECTOMY WITH SENTINEL LYMPH NODE BIOPSY Left 01/26/2022   Procedure: BREAST LUMPECTOMY WITH SENTINEL LYMPH NODE BX;  Surgeon: Earline Mayotte, MD;  Location: [facility] ORS;  Service: General;  Laterality: Left;   CHOLECYSTECTOMY  08/26/2022   COLONOSCOPY     PARTIAL HYSTERECTOMY  1995   due to heavy periods     Family History: Family History   Problem Relation Age of Onset   Thyroid disease Mother    Asthma Mother    Heart disease Mother    Obesity Mother    Atrial fibrillation Mother    Osteoarthritis Father    CAD Father    Heart disease Father    Obesity Father    Diabetes Maternal Uncle    Obesity Maternal Uncle    Stroke Paternal Aunt    Diabetes Maternal Grandfather    Tuberculosis Maternal Grandfather    Obesity Maternal Grandfather    Diabetes Paternal Grandmother    Obesity Paternal Grandmother    Heart attack Paternal Grandfather    Heart disease Paternal Grandfather    Obesity Paternal Grandfather    Breast cancer Other        great grandmother    Social History:  reports that she quit smoking about 9 years ago. Her smoking use included cigarettes. She started smoking about 49 years ago. She has a 30 pack-year smoking history. She has been exposed to tobacco smoke. She has never used smokeless tobacco. She reports that she does not drink alcohol and does not use drugs.  Physical Exam: BP (!) 159/74 (BP Location: Right Arm)   Pulse 96   Temp 98.2 F (36.8 C) (Oral)   Resp 20   Ht 5' 0.5" (1.537 m)  Wt 112 kg   LMP  (LMP Unknown)   SpO2 98%   BMI 47.44 kg/m    Constitutional:  Alert and oriented, No acute distress. Cardiovascular: Regular rate and rhythm Respiratory: Clear to auscultation bilaterally GI: Abdomen is soft, nontender, nondistended, no abdominal masses   Laboratory Data: Urine culture mixed flora   Assessment & Plan:   2 cm bladder stone on CT and confirmed on clinic cystoscopy, no definite bladder tumors seen. We discussed this could represent a free-floating stone, calcification from foreign body material from her prior sling procedure, ureteral/ureterocele stone that could have caused her atrophic kidney and then passed into the bladder. We reviewed options including observation or removal with cystolitholapaxy. She opts for removal. Risk and benefits discussed including  bleeding, infection, recurrence.   Cystolitholapaxy, possible bladder biopsy  Legrand Rams, MD 12/10/2023  [facility] [address], [address] [place], [place] [address] [phone]

## 2023-12-10 NOTE — Anesthesia Procedure Notes (Signed)
 Procedure Name: Intubation Date/Time: 12/10/2023 10:59 AM  Performed by: Cheral Bay, CRNAPre-anesthesia Checklist: Patient identified, Emergency Drugs available, Suction available and Patient being monitored Patient Re-evaluated:Patient Re-evaluated prior to induction Oxygen Delivery Method: Circle system utilized Preoxygenation: Pre-oxygenation with 100% oxygen Induction Type: IV induction Ventilation: Mask ventilation without difficulty Laryngoscope Size: McGrath and 3 Grade View: Grade I Tube type: Oral Tube size: 7.0 mm Number of attempts: 1 Airway Equipment and Method: Stylet Placement Confirmation: ETT inserted through vocal cords under direct vision, positive ETCO2 and breath sounds checked- equal and bilateral Secured at: 21 cm Tube secured with: Tape Dental Injury: Teeth and Oropharynx as per pre-operative assessment

## 2023-12-10 NOTE — Op Note (Signed)
 Date of procedure: 12/10/23  Preoperative diagnosis:  Bladder stone (2 cm)  Postoperative diagnosis:  Same  Procedure: Cystolitholapaxy(2 cm)  Surgeon: Legrand Rams, MD  Anesthesia: General  Complications: None  Intraoperative findings:  2 cm bladder stone fixed to the anterior left bladder neck, evidence of foreign body likely from prior GYN procedure as source of stone   EBL: Minimal  Specimens: None  Drains: 20 French two-way Foley  Indication: DENEE BOEDER is a 63 y.o. patient with large bladder stone on cystoscopy who opted for cystolitholapaxy.  After reviewing the management options for treatment, they elected to proceed with the above surgical procedure(s). We have discussed the potential benefits and risks of the procedure, side effects of the proposed treatment, the likelihood of the patient achieving the goals of the procedure, and any potential problems that might occur during the procedure or recuperation. Informed consent has been obtained.  Description of procedure:  The patient was taken to the operating room and general anesthesia was induced. SCDs were placed for DVT prophylaxis. The patient was placed in the dorsal lithotomy position, prepped and draped in the usual sterile fashion, and preoperative antibiotics were administered. A preoperative time-out was performed.   A 21 French cystoscope was used to intubate the urethra and thorough cystoscopy was performed.  I was unable to definitively identify the ureteral orifices.  The bladder itself was grossly normal, and there was a 2 cm yellow and black stone fixed to the left anterior bladder neck that was rather difficult to visualize and access.  The 4 French resectoscope was inserted and a 500 m laser fiber on settings of 2.0 J and 20 Hz was used to methodically fragment the stone.  I could see where this was affixed and there appeared to be foreign body possible suture where the stone had formed.   There was a small amount of foreign body remaining in the bladder wall that I did not feel comfortable lasering further.  The 2 cm stone was lasered to dust, and all fragments irrigated free from the bladder.  There was no bleeding with the bladder decompressed.  Disposition: Stable to PACU  Plan: Foley removal in clinic on Monday RTC with me 6 weeks  Legrand Rams, MD

## 2023-12-10 NOTE — Transfer of Care (Signed)
 Immediate Anesthesia Transfer of Care Note  Patient: Dorothy Cunningham  Procedure(s) Performed: CYSTOSCOPY, WITH BLADDER CALCULUS LITHOLAPAXY (Bladder)  Patient Location: PACU  Anesthesia Type:General  Level of Consciousness: awake  Airway & Oxygen Therapy: Patient Spontanous Breathing  Post-op Assessment: Report given to RN and Post -op Vital signs reviewed and stable  Post vital signs: Reviewed and stable  Last Vitals:  Vitals Value Taken Time  BP 160/95 12/10/23 1143  Temp    Pulse 93 12/10/23 1145  Resp 7 12/10/23 1145  SpO2 96 % 12/10/23 1145  Vitals shown include unfiled device data.  Last Pain:  Vitals:   12/10/23 0838  TempSrc: Oral  PainSc: 0-No pain         Complications: No notable events documented.

## 2023-12-10 NOTE — Anesthesia Postprocedure Evaluation (Signed)
 Anesthesia Post Note  Patient: Dorothy Cunningham  Procedure(s) Performed: CYSTOSCOPY, WITH BLADDER CALCULUS LITHOLAPAXY (Bladder)  Patient location during evaluation: PACU Anesthesia Type: General Level of consciousness: awake and alert Pain management: pain level controlled Vital Signs Assessment: post-procedure vital signs reviewed and stable Respiratory status: spontaneous breathing, nonlabored ventilation, respiratory function stable and patient connected to nasal cannula oxygen Cardiovascular status: blood pressure returned to baseline and stable Postop Assessment: no apparent nausea or vomiting Anesthetic complications: no  No notable events documented.   Last Vitals:  Vitals:   12/10/23 1225 12/10/23 1230  BP:  (!) 155/81  Pulse: 77 79  Resp: (!) 7 13  Temp:  (!) 36 C  SpO2: 96% 95%    Last Pain:  Vitals:   12/10/23 1225  TempSrc:   PainSc: 0-No pain                 Stephanie Coup

## 2023-12-11 ENCOUNTER — Encounter: Payer: Self-pay | Admitting: Urology

## 2023-12-13 ENCOUNTER — Ambulatory Visit (INDEPENDENT_AMBULATORY_CARE_PROVIDER_SITE_OTHER): Admitting: Urology

## 2023-12-13 ENCOUNTER — Telehealth: Payer: Self-pay

## 2023-12-13 VITALS — BP 150/84 | HR 102 | Ht 60.5 in | Wt 247.0 lb

## 2023-12-13 DIAGNOSIS — N210 Calculus in bladder: Secondary | ICD-10-CM

## 2023-12-13 NOTE — Progress Notes (Signed)
 Catheter Removal  Patient is present today for a catheter removal.  12 ml of water was drained from the balloon. A 20 FR foley cath was removed from the bladder, no complications were noted. Patient tolerated well.  Performed by: Trevino Wyatt H RMA  Follow up/ Additional notes:f/u 6 weeks

## 2023-12-13 NOTE — Telephone Encounter (Signed)
 Called pt to inform her of post op appointment scheduled for today in [place] at 10:20am, no answer. Mychart message sent.

## 2023-12-24 ENCOUNTER — Encounter

## 2023-12-24 ENCOUNTER — Inpatient Hospital Stay: Admission: RE | Admit: 2023-12-24 | Source: Ambulatory Visit

## 2023-12-29 ENCOUNTER — Other Ambulatory Visit: Payer: Self-pay | Admitting: Nurse Practitioner

## 2023-12-30 ENCOUNTER — Other Ambulatory Visit: Payer: Self-pay | Admitting: Nurse Practitioner

## 2023-12-30 NOTE — Telephone Encounter (Signed)
 Requested Prescriptions  Refused Prescriptions Disp Refills   buPROPion (WELLBUTRIN XL) 300 MG 24 hr tablet [Pharmacy Med Name: BUPROPION XL 300MG  TABLETS] 90 tablet 4    Sig: TAKE 1 TABLET(300 MG) BY MOUTH DAILY     Psychiatry: Antidepressants - bupropion Failed - 12/30/2023  5:21 PM      Failed - Cr in normal range and within 360 days    Creatinine  Date Value Ref Range Status  09/17/2023 1.24 (H) 0.44 - 1.00 mg/dL Final   Creatinine, Ser  Date Value Ref Range Status  10/19/2023 1.26 (H) 0.57 - 1.00 mg/dL Final         Failed - ALT in normal range and within 360 days    ALT  Date Value Ref Range Status  10/19/2023 33 (H) 0 - 32 IU/L Final  09/17/2023 32 0 - 44 U/L Final         Failed - Last BP in normal range    BP Readings from Last 1 Encounters:  12/13/23 (!) 150/84         Passed - AST in normal range and within 360 days    AST  Date Value Ref Range Status  10/19/2023 26 0 - 40 IU/L Final  09/17/2023 23 15 - 41 U/L Final         Passed - Completed PHQ-2 or PHQ-9 in the last 360 days      Passed - Valid encounter within last 6 months    Recent Outpatient Visits           1 month ago Centrilobular emphysema (HCC)   [facility] [place], Dorie Rank, NP       Future Appointments             In 3 weeks Richardo Hanks, Laurette Schimke, MD [facility] [place]   In 4 months Marjie Skiff, NP [place] [facility]

## 2024-01-25 ENCOUNTER — Ambulatory Visit: Admitting: Urology

## 2024-02-01 ENCOUNTER — Ambulatory Visit: Admitting: Urology

## 2024-02-25 ENCOUNTER — Other Ambulatory Visit: Payer: Self-pay | Admitting: Nurse Practitioner

## 2024-02-28 NOTE — Telephone Encounter (Signed)
 Requested Prescriptions  Pending Prescriptions Disp Refills   albuterol  (VENTOLIN  HFA) 108 (90 Base) MCG/ACT inhaler [Pharmacy Med Name: ALBUTEROL  HFA INH (200 PUFFS) 6.7GM] 6.7 g 2    Sig: INHALE 2 PUFFS INTO THE LUNGS EVERY 6 HOURS AS NEEDED FOR WHEEZING OR SHORTNESS OF BREATH     Pulmonology:  Beta Agonists 2 Failed - 02/28/2024 10:59 AM      Failed - Last BP in normal range    BP Readings from Last 1 Encounters:  12/13/23 (!) 150/84         Passed - Last Heart Rate in normal range    Pulse Readings from Last 1 Encounters:  12/13/23 (!) 102         Passed - Valid encounter within last 12 months    Recent Outpatient Visits           3 months ago Centrilobular emphysema (HCC)   [facility] [place], Sanjuan Crumbly T, NP       Future Appointments             In 2 months Cannady, Jolene T, NP [place] [facility], [facility]

## 2024-03-13 ENCOUNTER — Encounter: Payer: Self-pay | Admitting: Nurse Practitioner

## 2024-03-13 DIAGNOSIS — J432 Centrilobular emphysema: Secondary | ICD-10-CM

## 2024-03-13 MED ORDER — UMECLIDINIUM-VILANTEROL 62.5-25 MCG/ACT IN AEPB: INHALATION_SPRAY | RESPIRATORY_TRACT | 5 refills | Status: DC

## 2024-03-17 ENCOUNTER — Inpatient Hospital Stay: Payer: Federal, State, Local not specified - PPO | Attending: Oncology

## 2024-03-17 ENCOUNTER — Encounter: Payer: Self-pay | Admitting: Oncology

## 2024-03-17 ENCOUNTER — Inpatient Hospital Stay: Payer: Federal, State, Local not specified - PPO | Admitting: Oncology

## 2024-03-17 VITALS — BP 147/82 | HR 85 | Temp 95.0°F | Resp 18 | Wt 255.5 lb

## 2024-03-17 DIAGNOSIS — N1831 Chronic kidney disease, stage 3a: Secondary | ICD-10-CM

## 2024-03-17 DIAGNOSIS — C50919 Malignant neoplasm of unspecified site of unspecified female breast: Secondary | ICD-10-CM

## 2024-03-17 DIAGNOSIS — Z823 Family history of stroke: Secondary | ICD-10-CM | POA: Diagnosis not present

## 2024-03-17 DIAGNOSIS — Z79811 Long term (current) use of aromatase inhibitors: Secondary | ICD-10-CM | POA: Diagnosis not present

## 2024-03-17 DIAGNOSIS — Z880 Allergy status to penicillin: Secondary | ICD-10-CM | POA: Diagnosis not present

## 2024-03-17 DIAGNOSIS — Z170 Estrogen receptor positive status [ER+]: Secondary | ICD-10-CM | POA: Insufficient documentation

## 2024-03-17 DIAGNOSIS — G4730 Sleep apnea, unspecified: Secondary | ICD-10-CM | POA: Insufficient documentation

## 2024-03-17 DIAGNOSIS — Z803 Family history of malignant neoplasm of breast: Secondary | ICD-10-CM | POA: Insufficient documentation

## 2024-03-17 DIAGNOSIS — Z8261 Family history of arthritis: Secondary | ICD-10-CM | POA: Diagnosis not present

## 2024-03-17 DIAGNOSIS — C50512 Malignant neoplasm of lower-outer quadrant of left female breast: Secondary | ICD-10-CM | POA: Insufficient documentation

## 2024-03-17 DIAGNOSIS — Z90710 Acquired absence of both cervix and uterus: Secondary | ICD-10-CM | POA: Insufficient documentation

## 2024-03-17 DIAGNOSIS — Z8349 Family history of other endocrine, nutritional and metabolic diseases: Secondary | ICD-10-CM | POA: Insufficient documentation

## 2024-03-17 DIAGNOSIS — Z7982 Long term (current) use of aspirin: Secondary | ICD-10-CM | POA: Insufficient documentation

## 2024-03-17 DIAGNOSIS — Z87891 Personal history of nicotine dependence: Secondary | ICD-10-CM | POA: Diagnosis not present

## 2024-03-17 DIAGNOSIS — Z87442 Personal history of urinary calculi: Secondary | ICD-10-CM | POA: Diagnosis not present

## 2024-03-17 DIAGNOSIS — Z1732 Human epidermal growth factor receptor 2 negative status: Secondary | ICD-10-CM | POA: Diagnosis not present

## 2024-03-17 DIAGNOSIS — Z825 Family history of asthma and other chronic lower respiratory diseases: Secondary | ICD-10-CM | POA: Diagnosis not present

## 2024-03-17 DIAGNOSIS — E785 Hyperlipidemia, unspecified: Secondary | ICD-10-CM | POA: Diagnosis not present

## 2024-03-17 DIAGNOSIS — Z9049 Acquired absence of other specified parts of digestive tract: Secondary | ICD-10-CM | POA: Diagnosis not present

## 2024-03-17 DIAGNOSIS — Z1721 Progesterone receptor positive status: Secondary | ICD-10-CM | POA: Insufficient documentation

## 2024-03-17 DIAGNOSIS — I129 Hypertensive chronic kidney disease with stage 1 through stage 4 chronic kidney disease, or unspecified chronic kidney disease: Secondary | ICD-10-CM | POA: Diagnosis not present

## 2024-03-17 DIAGNOSIS — Z8249 Family history of ischemic heart disease and other diseases of the circulatory system: Secondary | ICD-10-CM | POA: Insufficient documentation

## 2024-03-17 DIAGNOSIS — Z833 Family history of diabetes mellitus: Secondary | ICD-10-CM | POA: Insufficient documentation

## 2024-03-17 DIAGNOSIS — Z79899 Other long term (current) drug therapy: Secondary | ICD-10-CM | POA: Insufficient documentation

## 2024-03-17 LAB — CMP (CANCER CENTER ONLY)
ALT: 33 U/L (ref 0–44)
AST: 23 U/L (ref 15–41)
Albumin: 3.9 g/dL (ref 3.5–5.0)
Alkaline Phosphatase: 77 U/L (ref 38–126)
Anion gap: 9 (ref 5–15)
BUN: 23 mg/dL (ref 8–23)
CO2: 23 mmol/L (ref 22–32)
Calcium: 9.3 mg/dL (ref 8.9–10.3)
Chloride: 103 mmol/L (ref 98–111)
Creatinine: 1.14 mg/dL — ABNORMAL HIGH (ref 0.44–1.00)
GFR, Estimated: 53 mL/min — ABNORMAL LOW (ref >60)
Glucose, Bld: 97 mg/dL (ref 70–99)
Potassium: 4.2 mmol/L (ref 3.5–5.1)
Sodium: 135 mmol/L (ref 135–145)
Total Bilirubin: 0.8 mg/dL (ref 0.0–1.2)
Total Protein: 7.4 g/dL (ref 6.5–8.1)

## 2024-03-17 LAB — CBC WITH DIFFERENTIAL (CANCER CENTER ONLY)
Abs Immature Granulocytes: 0.04 10*3/uL (ref 0.00–0.07)
Basophils Absolute: 0.1 10*3/uL (ref 0.0–0.1)
Basophils Relative: 1 %
Eosinophils Absolute: 0.2 10*3/uL (ref 0.0–0.5)
Eosinophils Relative: 2 %
HCT: 41.7 % (ref 36.0–46.0)
Hemoglobin: 14.2 g/dL (ref 12.0–15.0)
Immature Granulocytes: 0 %
Lymphocytes Relative: 31 %
Lymphs Abs: 3 10*3/uL (ref 0.7–4.0)
MCH: 31.8 pg (ref 26.0–34.0)
MCHC: 34.1 g/dL (ref 30.0–36.0)
MCV: 93.5 fL (ref 80.0–100.0)
Monocytes Absolute: 0.6 10*3/uL (ref 0.1–1.0)
Monocytes Relative: 6 %
Neutro Abs: 6 10*3/uL (ref 1.7–7.7)
Neutrophils Relative %: 60 %
Platelet Count: 286 10*3/uL (ref 150–400)
RBC: 4.46 MIL/uL (ref 3.87–5.11)
RDW: 12.4 % (ref 11.5–15.5)
WBC Count: 9.9 10*3/uL (ref 4.0–10.5)
nRBC: 0 % (ref 0.0–0.2)

## 2024-03-17 MED ORDER — ANASTROZOLE 1 MG PO TABS: 1.0000 mg | ORAL_TABLET | Freq: Every day | ORAL | 1 refills | Status: DC

## 2024-03-17 NOTE — Progress Notes (Signed)
 Hematology/Oncology progress note [phone] [phone]         Patient Care Team: Lemar Pyles, NP as PCP - General (Nurse Practitioner) Arlette Benders, RN (Inactive) as Oncology Nurse Navigator Timmy Forbes, MD as Consulting Physician (Oncology) Glenis Langdon, MD as Consulting Physician (Radiation Oncology) Flynn Hylan, MD as Consulting Physician (General Surgery)  ASSESSMENT & PLAN:   Cancer Staging  Invasive carcinoma of breast [facility]) Staging form: Breast, AJCC 8th Edition - Clinical stage from 11/14/2021: Stage IIA (cT2, cN0, cM0, G3, ER+, PR+, HER2-) - Signed by Timmy Forbes, MD on 04/28/2022    Invasive carcinoma of breast (HCC) Stage IIA left breast cancer, pT2 pN0 ER+, PR+, HER- Oncotype Dx 14, no adjuvant chemotherapy benefit. S/p adjuvant Radiation.  Patient tolerates Arimidex   Labs are reviewed and discussed with patient. Continue Arimidex  1 mg daily.  Refills were sent to pharmacy. annual diagnostic mammogram in February 2025 -results were reviewed and discussed with patient. superficial 7 mm mass in the LEFT breast near the scar line.  Recommend biopsy.  Stage 3a chronic kidney disease (HCC) Avoid nephrotoxins.  Encourage oral hydration  Aromatase inhibitor use Recommend calcium  and vitamin D  supplementation Oct 2023 DEXA showed normal bone density.   Orders Placed This Encounter  Procedures   CMP (Cancer Center only)    Standing Status:   Future    Expected Date:   09/16/2024    Expiration Date:   12/15/2024   CBC with Differential (Cancer Center Only)    Standing Status:   Future    Expected Date:   09/16/2024    Expiration Date:   12/15/2024   Follow-up 6 months.  All questions were answered. The patient knows to call the clinic with any problems, questions or concerns.  Timmy Forbes, MD, PhD [facility] Hematology Oncology 03/17/2024   CHIEF COMPLAINTS/REASON FOR VISIT:  Follow up for left breast cancer  HISTORY OF  PRESENTING ILLNESS:   Dorothy Cunningham is a  63 y.o.  female presents to follow-up for Stage Iia left breast cancer Oncological history summary as below. Oncology History  Invasive carcinoma of breast (HCC)  11/14/2021 Initial Diagnosis   Invasive carcinoma of breast [facility]) Patient felt a left breast mass. 10/30/2021, bilateral diagnostic mammogram showed 21 mm mass in the left breast, 5:00, 9 cm from nipple.  No suspicious left axillary lymphadenopathy.  No mammographic evidence of malignancy in the right breast.   11/06/2021, left breast ultrasound-guided biopsy showed invasive mammary carcinoma, no special type.  Grade 3, DCIS not identified, LVI not identified.  ER+/PR +HER2 - Menarche 63 years of age, patient has 3 children.  She was 49 years old when she gave birth to her first child.  Short-term OCP use from [phone].  Patient has a history of hysterectomy 1990.  She denies family history of breast cancer. Denies any hormone replacement therapy.  Denies any chest radiation.  She recalls left breast biopsy in 2012.  She did not know details of the pathology.   11/14/2021 Cancer Staging   Staging form: Breast, AJCC 8th Edition - Clinical stage from 11/14/2021: Stage IIA (cT2, cN0, cM0, G3, ER+, PR+, HER2-) - Signed by Timmy Forbes, MD on 04/28/2022 Stage prefix: Initial diagnosis Histologic grading system: 3 grade system   01/26/2022 Surgery   Left breast lumpectomy and sentinel lymph node biopsy. Invasive mammary carcinoma, DCIS, 3 lymph nodes negative for malignancy.  Grade 3, all margins negative for invasive carcinoma.  Distance from closest margin 4  mm.  All margins negative for DCIS. pT2 pN0   01/26/2022 Oncotype testing   Oncotype DX breast recurrence score  14   04/01/2022 - 04/14/2022 Radiation Therapy   Adjuvant left breast radiation   04/2022 -  Anti-estrogen oral therapy   Started on Arimidex .    06/29/2022 Imaging   DEXA normal bone density    Patient reports feeling well.  She  tolerates Arimidex  1mg  daily.  She denies hot flush or arthralgia.    Review of Systems  Constitutional:  Negative for appetite change, chills, fatigue and fever.  HENT:   Negative for hearing loss and voice change.   Eyes:  Negative for eye problems.  Respiratory:  Negative for chest tightness and cough.   Cardiovascular:  Negative for chest pain.  Gastrointestinal:  Negative for abdominal distention, abdominal pain and blood in stool.  Endocrine: Negative for hot flashes.  Genitourinary:  Negative for difficulty urinating and frequency.   Musculoskeletal:  Negative for arthralgias.  Skin:  Negative for itching and rash.  Neurological:  Negative for extremity weakness.  Hematological:  Negative for adenopathy.  Psychiatric/Behavioral:  Negative for confusion.     MEDICAL HISTORY:  Past Medical History:  Diagnosis Date   Allergic rhinitis    Allergy 1963   Penicillin   Anxiety    Arthritis 4 years   Breast cancer, left breast (HCC) 10/2021   Chronic kidney disease, stage 3a (HCC)    COPD (chronic obstructive pulmonary disease) (HCC)    Depression    Dyspnea    Dyspnea    Emphysema of lung (HCC) 5 years   History of epilepsy    as a teenager   History of kidney stones    Hyperlipidemia    Hypertension    Invasive carcinoma of breast (HCC)    Morbid obesity with BMI of 45.0-49.9, adult (HCC)    Personal history of radiation therapy    Pneumonia    Porcelain gallbladder    Seizures (HCC) About 1970   none for 30 + years   Sleep apnea    no CPAP    SURGICAL HISTORY: Past Surgical History:  Procedure Laterality Date   ABDOMINAL HYSTERECTOMY  about 2006   Bladder Tuck  1995   BREAST BIOPSY Left 2014   benign   BREAST BIOPSY Left 11/06/2021   u/s bx  5 o'clock, VENUS clip- INVASIVE MAMMARY CARCINOMA,   BREAST LUMPECTOMY WITH SENTINEL LYMPH NODE BIOPSY Left 01/26/2022   Procedure: BREAST LUMPECTOMY WITH SENTINEL LYMPH NODE BX;  Surgeon: Marshall Skeeter, MD;   Location: [facility] ORS;  Service: General;  Laterality: Left;   CHOLECYSTECTOMY  08/26/2022   COLONOSCOPY     CYSTOSCOPY WITH LITHOLAPAXY N/A 12/10/2023   Procedure: CYSTOSCOPY, WITH BLADDER CALCULUS LITHOLAPAXY;  Surgeon: Lawerence Pressman, MD;  Location: [facility] ORS;  Service: Urology;  Laterality: N/A;   PARTIAL HYSTERECTOMY  1995   due to heavy periods    SOCIAL HISTORY: Social History   Socioeconomic History   Marital status: Married    Spouse name: Charles   Number of children: 4   Years of education: Not on file   Highest education level: Not on file  Occupational History   Not on file  Tobacco Use   Smoking status: Former    Current packs/day: 0.00    Average packs/day: 0.8 packs/day for 40.0 years (30.0 ttl pk-yrs)    Types: Cigarettes    Start date: 09/28/1974    Quit date: 09/28/2014  Years since quitting: 9.4    Passive exposure: Past   Smokeless tobacco: Never   Tobacco comments:    Already quit  Vaping Use   Vaping status: Former  Substance and Sexual Activity   Alcohol use: No   Drug use: No   Sexual activity: Not Currently    Birth control/protection: None  Other Topics Concern   Not on file  Social History Narrative   Not on file   Social Drivers of Health   Financial Resource Strain: Low Risk  (10/14/2022)   Overall Financial Resource Strain (CARDIA)    Difficulty of Paying Living Expenses: Not hard at all  Food Insecurity: No Food Insecurity (10/14/2022)   Hunger Vital Sign    Worried About Running Out of Food in the Last Year: Never true    Ran Out of Food in the Last Year: Never true  Transportation Needs: No Transportation Needs (10/14/2022)   PRAPARE - Administrator, Civil Service (Medical): No    Lack of Transportation (Non-Medical): No  Physical Activity: Insufficiently Active (10/14/2022)   Exercise Vital Sign    Days of Exercise per Week: 2 days    Minutes of Exercise per Session: 30 min  Stress: No Stress Concern Present  (10/14/2022)   [employer] of Occupational Health - Occupational Stress Questionnaire    Feeling of Stress : Not at all  Social Connections: Socially Integrated (10/14/2022)   Social Connection and Isolation Panel    Frequency of Communication with Friends and Family: More than three times a week    Frequency of Social Gatherings with Friends and Family: More than three times a week    Attends Religious Services: More than 4 times per year    Active Member of [employer] or Organizations: Yes    Attends Banker Meetings: Never    Marital Status: Married  Catering manager Violence: Not At Risk (10/14/2022)   Humiliation, Afraid, Rape, and Kick questionnaire    Fear of Current or Ex-Partner: No    Emotionally Abused: No    Physically Abused: No    Sexually Abused: No    FAMILY HISTORY: Family History  Problem Relation Age of Onset   Thyroid disease Mother    Asthma Mother    Heart disease Mother    Obesity Mother    Atrial fibrillation Mother    Osteoarthritis Father    CAD Father    Heart disease Father    Obesity Father    Diabetes Maternal Uncle    Obesity Maternal Uncle    Stroke Paternal Aunt    Diabetes Maternal Grandfather    Tuberculosis Maternal Grandfather    Obesity Maternal Grandfather    Diabetes Paternal Grandmother    Obesity Paternal Grandmother    Heart attack Paternal Grandfather    Heart disease Paternal Grandfather    Obesity Paternal Grandfather    Breast cancer Other        great grandmother    ALLERGIES:  is allergic to penicillins.  MEDICATIONS:  Current Outpatient Medications  Medication Sig Dispense Refill   albuterol  (VENTOLIN  HFA) 108 (90 Base) MCG/ACT inhaler INHALE 2 PUFFS INTO THE LUNGS EVERY 6 HOURS AS NEEDED FOR WHEEZING OR SHORTNESS OF BREATH 6.7 g 2   amLODipine  (NORVASC ) 2.5 MG tablet Take 1 tablet (2.5 mg total) by mouth daily. 90 tablet 4   aspirin 81 MG EC tablet Take 81 mg by mouth daily.     atorvastatin  (LIPITOR)  80 MG  tablet Take 1 tablet (80 mg total) by mouth daily. 90 tablet 3   b complex vitamins capsule Take 1 capsule by mouth daily.     buPROPion  (WELLBUTRIN  XL) 300 MG 24 hr tablet TAKE 1 TABLET(300 MG) BY MOUTH DAILY 90 tablet 4   Calcium -Magnesium-Vitamin D  (CALCIUM  1200+D3 PO) Take 1 tablet by mouth daily.     clotrimazole -betamethasone  (LOTRISONE ) cream Apply 1 Application topically 2 (two) times daily. (Patient taking differently: Apply 1 Application topically daily as needed (rash).) 30 g 0   diclofenac  Sodium (VOLTAREN ) 1 % GEL APPLY 2 GRAMS TOPICALLY TO THE AFFECTED AREA FOUR TIMES DAILY AS NEEDED 100 g 2   diphenhydrAMINE (BENADRYL) 25 MG tablet Take 25 mg by mouth daily.     fluticasone  (FLONASE ) 50 MCG/ACT nasal spray SHAKE LIQUID AND USE 2 SPRAYS IN EACH NOSTRIL DAILY (Patient taking differently: Place 2 sprays into both nostrils daily as needed for allergies.) 48 g 2   Garlic (GARLIQUE PO) Take 1 tablet by mouth daily.     losartan  (COZAAR ) 100 MG tablet Take 1 tablet (100 mg total) by mouth daily. 90 tablet 4   Multiple Vitamin (MULTIVITAMIN) tablet Take 1 tablet by mouth daily.     nystatin  ointment (MYCOSTATIN ) APPLY TOPICALLY TO THE AFFECTED AREA THREE TIMES DAILY (Patient taking differently: Apply 1 Application topically daily as needed (under breast).) 60 g 3   Omega-3 Fatty Acids (FISH OIL PO) Take 1 capsule by mouth daily.     umeclidinium-vilanterol (ANORO ELLIPTA ) 62.5-25 MCG/ACT AEPB INHALE 1 PUFF INTO THE LUNGS DAILY AT 6 AM 60 each 5   anastrozole  (ARIMIDEX ) 1 MG tablet Take 1 tablet (1 mg total) by mouth daily. 90 tablet 1   No current facility-administered medications for this visit.     PHYSICAL EXAMINATION: ECOG PERFORMANCE STATUS: 0 - Asymptomatic Vitals:   03/17/24 1144 03/17/24 1155  BP: (!) 160/97 (!) 147/82  Pulse: 85   Resp: 18   Temp: (!) 95 F (35 C)   SpO2: 98%    Filed Weights   03/17/24 1144  Weight: 255 lb 8 oz (115.9 kg)     Physical  Exam Constitutional:      General: She is not in acute distress. HENT:     Head: Normocephalic and atraumatic.   Eyes:     General: No scleral icterus.   Cardiovascular:     Rate and Rhythm: Normal rate and regular rhythm.  Pulmonary:     Effort: Pulmonary effort is normal. No respiratory distress.     Breath sounds: No wheezing.  Abdominal:     General: Bowel sounds are normal. There is no distension.     Palpations: Abdomen is soft.   Musculoskeletal:        General: Normal range of motion.     Cervical back: Normal range of motion and neck supple.   Skin:    General: Skin is warm and dry.     Findings: No erythema or rash.   Neurological:     Mental Status: She is alert and oriented to person, place, and time. Mental status is at baseline.   Psychiatric:        Mood and Affect: Mood normal.   Breast exam was performed in seated and lying down position. Patient is status post left lumpectomy with a well-healed surgical scar with a superficial small mass  No palpable breast masses bilaterally.  No palpable axillary adenopathy bilaterally.   LABORATORY DATA:  I have reviewed  the data as listed     Latest Ref Rng & Units 03/17/2024   11:30 AM 10/19/2023    1:29 PM 09/17/2023   11:04 AM  CBC  WBC 4.0 - 10.5 K/uL 9.9  10.1  7.8   Hemoglobin 12.0 - 15.0 g/dL 16.1  09.6  04.5   Hematocrit 36.0 - 46.0 % 41.7  43.4  40.9   Platelets 150 - 400 K/uL 286  358  271       Latest Ref Rng & Units 03/17/2024   11:30 AM 10/19/2023    1:29 PM 09/17/2023   11:04 AM  CMP  Glucose 70 - 99 mg/dL 97  86  409   BUN 8 - 23 mg/dL 23  15  14    Creatinine 0.44 - 1.00 mg/dL 8.11  9.14  7.82   Sodium 135 - 145 mmol/L 135  137  138   Potassium 3.5 - 5.1 mmol/L 4.2  4.2  3.8   Chloride 98 - 111 mmol/L 103  98  103   CO2 22 - 32 mmol/L 23  21  25    Calcium  8.9 - 10.3 mg/dL 9.3  95.6  9.1   Total Protein 6.5 - 8.1 g/dL 7.4  6.9  7.0   Total Bilirubin 0.0 - 1.2 mg/dL 0.8  0.4  0.5    Alkaline Phos 38 - 126 U/L 77  99  64   AST 15 - 41 U/L 23  26  23    ALT 0 - 44 U/L 33  33  32       RADIOGRAPHIC STUDIES: I have personally reviewed the radiological images as listed and agreed with the findings in the report. No results found.

## 2024-03-17 NOTE — Patient Instructions (Signed)
 Please call [facility] to reschedule your biopsy: [phone]

## 2024-03-17 NOTE — Assessment & Plan Note (Addendum)
 Recommend calcium  and vitamin D  supplementation Oct 2023 DEXA showed normal bone density.

## 2024-03-17 NOTE — Assessment & Plan Note (Addendum)
 Stage IIA left breast cancer, pT2 pN0 ER+, PR+, HER- Oncotype Dx 14, no adjuvant chemotherapy benefit. S/p adjuvant Radiation.  Patient tolerates Arimidex   Labs are reviewed and discussed with patient. Continue Arimidex  1 mg daily.  Refills were sent to pharmacy. annual diagnostic mammogram in February 2025 -results were reviewed and discussed with patient. superficial 7 mm mass in the LEFT breast near the scar line.  Recommend biopsy.

## 2024-03-17 NOTE — Assessment & Plan Note (Signed)
Avoid nephrotoxins.  Encourage oral hydration  

## 2024-04-28 NOTE — Patient Instructions (Incomplete)
 Be Involved in Caring For Your Health:  Taking Medications When medications are taken as directed, they can greatly improve your health. But if they are not taken as prescribed, they may not work. In some cases, not taking them correctly can be harmful. To help ensure your treatment remains effective and safe, understand your medications and how to take them. Bring your medications to each visit for review by your provider.  Your lab results, notes, and after visit summary will be available on My Chart. We strongly encourage you to use this feature. If lab results are abnormal the clinic will contact you with the appropriate steps. If the clinic does not contact you assume the results are satisfactory. You can always view your results on My Chart. If you have questions regarding your health or results, please contact the clinic during office hours. You can also ask questions on My Chart.  We at [facility] are grateful that you chose [place] to provide your care. We strive to provide evidence-based and compassionate care and are always looking for feedback. If you get a survey from the clinic please complete this so we can hear your opinions.  Heart-Healthy Eating Plan Many factors influence your heart health, including eating and exercise habits. Heart health is also called coronary health. Coronary risk increases with abnormal blood fat (lipid) levels. A heart-healthy eating plan includes limiting unhealthy fats, increasing healthy fats, limiting salt (sodium) intake, and making other diet and lifestyle changes. What is my plan? Your health care provider may recommend that: You limit your fat intake to _________% or less of your total calories each day. You limit your saturated fat intake to _________% or less of your total calories each day. You limit the amount of cholesterol in your diet to less than _________ mg per day. You limit the amount of sodium in your diet to less than _________  mg per day. What are tips for following this plan? Cooking Cook foods using methods other than frying. Baking, boiling, grilling, and broiling are all good options. Other ways to reduce fat include: Removing the skin from poultry. Removing all visible fats from meats. Steaming vegetables in water or broth. Meal planning  At meals, imagine dividing your plate into fourths: Fill one-half of your plate with vegetables and green salads. Fill one-fourth of your plate with whole grains. Fill one-fourth of your plate with lean protein foods. Eat 2-4 cups of vegetables per day. One cup of vegetables equals 1 cup (91 g) broccoli or cauliflower florets, 2 medium carrots, 1 large bell pepper, 1 large sweet potato, 1 large tomato, 1 medium white potato, 2 cups (150 g) raw leafy greens. Eat 1-2 cups of fruit per day. One cup of fruit equals 1 small apple, 1 large banana, 1 cup (237 g) mixed fruit, 1 large orange,  cup (82 g) dried fruit, 1 cup (240 mL) 100% fruit juice. Eat more foods that contain soluble fiber. Examples include apples, broccoli, carrots, beans, peas, and barley. Aim to get 25-30 g of fiber per day. Increase your consumption of legumes, nuts, and seeds to 4-5 servings per week. One serving of dried beans or legumes equals  cup (90 g) cooked, 1 serving of nuts is  oz (12 almonds, 24 pistachios, or 7 walnut halves), and 1 serving of seeds equals  oz (8 g). Fats Choose healthy fats more often. Choose monounsaturated and polyunsaturated fats, such as olive and canola oils, avocado oil, flaxseeds, walnuts, almonds, and seeds. Eat  more omega-3 fats. Choose salmon, mackerel, sardines, tuna, flaxseed oil, and ground flaxseeds. Aim to eat fish at least 2 times each week. Check food labels carefully to identify foods with trans fats or high amounts of saturated fat. Limit saturated fats. These are found in animal products, such as meats, butter, and cream. Plant sources of saturated fats  include palm oil, palm kernel oil, and coconut oil. Avoid foods with partially hydrogenated oils in them. These contain trans fats. Examples are stick margarine, some tub margarines, cookies, crackers, and other baked goods. Avoid fried foods. General information Eat more home-cooked food and less restaurant, buffet, and fast food. Limit or avoid alcohol. Limit foods that are high in added sugar and simple starches such as foods made using white refined flour (white breads, pastries, sweets). Lose weight if you are overweight. Losing just 5-10% of your body weight can help your overall health and prevent diseases such as diabetes and heart disease. Monitor your sodium intake, especially if you have high blood pressure. Talk with your health care provider about your sodium intake. Try to incorporate more vegetarian meals weekly. What foods should I eat? Fruits All fresh, canned (in natural juice), or frozen fruits. Vegetables Fresh or frozen vegetables (raw, steamed, roasted, or grilled). Green salads. Grains Most grains. Choose whole wheat and whole grains most of the time. Rice and pasta, including brown rice and pastas made with whole wheat. Meats and other proteins Lean, well-trimmed beef, veal, pork, and lamb. Chicken and [place] without skin. All fish and shellfish. Wild duck, rabbit, pheasant, and venison. Egg whites or low-cholesterol egg substitutes. Dried beans, peas, lentils, and tofu. Seeds and most nuts. Dairy Low-fat or nonfat cheeses, including ricotta and mozzarella. Skim or 1% milk (liquid, powdered, or evaporated). Buttermilk made with low-fat milk. Nonfat or low-fat yogurt. Fats and oils Non-hydrogenated (trans-free) margarines. Vegetable oils, including soybean, sesame, sunflower, olive, avocado, peanut, safflower, corn, canola, and cottonseed. Salad dressings or mayonnaise made with a vegetable oil. Beverages Water (mineral or sparkling). Coffee and tea. Unsweetened ice  tea. Diet beverages. Sweets and desserts Sherbet, gelatin, and fruit ice. Small amounts of dark chocolate. Limit all sweets and desserts. Seasonings and condiments All seasonings and condiments. The items listed above may not be a complete list of foods and beverages you can eat. Contact a dietitian for more options. What foods should I avoid? Fruits Canned fruit in heavy syrup. Fruit in cream or butter sauce. Fried fruit. Limit coconut. Vegetables Vegetables cooked in cheese, cream, or butter sauce. Fried vegetables. Grains Breads made with saturated or trans fats, oils, or whole milk. Croissants. Sweet rolls. Donuts. High-fat crackers, such as cheese crackers and chips. Meats and other proteins Fatty meats, such as hot dogs, ribs, sausage, bacon, rib-eye roast or steak. High-fat deli meats, such as salami and bologna. Caviar. Domestic duck and goose. Organ meats, such as liver. Dairy Cream, sour cream, cream cheese, and creamed cottage cheese. Whole-milk cheeses. Whole or 2% milk (liquid, evaporated, or condensed). Whole buttermilk. Cream sauce or high-fat cheese sauce. Whole-milk yogurt. Fats and oils Meat fat, or shortening. Cocoa butter, hydrogenated oils, palm oil, coconut oil, palm kernel oil. Solid fats and shortenings, including bacon fat, salt pork, lard, and butter. Nondairy cream substitutes. Salad dressings with cheese or sour cream. Beverages Regular sodas and any drinks with added sugar. Sweets and desserts Frosting. Pudding. Cookies. Cakes. Pies. Milk chocolate or white chocolate. Buttered syrups. Full-fat ice cream or ice cream drinks. The items listed above may  not be a complete list of foods and beverages to avoid. Contact a dietitian for more information. Summary Heart-healthy meal planning includes limiting unhealthy fats, increasing healthy fats, limiting salt (sodium) intake and making other diet and lifestyle changes. Lose weight if you are overweight. Losing just  5-10% of your body weight can help your overall health and prevent diseases such as diabetes and heart disease. Focus on eating a balance of foods, including fruits and vegetables, low-fat or nonfat dairy, lean protein, nuts and legumes, whole grains, and heart-healthy oils and fats. This information is not intended to replace advice given to you by your health care provider. Make sure you discuss any questions you have with your health care provider. Document Revised: 10/20/2021 Document Reviewed: 10/20/2021 Elsevier Patient Education  2024 [employer].

## 2024-05-01 ENCOUNTER — Ambulatory Visit: Payer: Federal, State, Local not specified - PPO | Admitting: Nurse Practitioner

## 2024-05-01 ENCOUNTER — Other Ambulatory Visit: Payer: Self-pay | Admitting: Nurse Practitioner

## 2024-05-01 DIAGNOSIS — I700 Atherosclerosis of aorta: Secondary | ICD-10-CM

## 2024-05-01 DIAGNOSIS — F321 Major depressive disorder, single episode, moderate: Secondary | ICD-10-CM

## 2024-05-01 DIAGNOSIS — E782 Mixed hyperlipidemia: Secondary | ICD-10-CM

## 2024-05-01 DIAGNOSIS — J432 Centrilobular emphysema: Secondary | ICD-10-CM

## 2024-05-01 DIAGNOSIS — E559 Vitamin D deficiency, unspecified: Secondary | ICD-10-CM

## 2024-05-01 DIAGNOSIS — I10 Essential (primary) hypertension: Secondary | ICD-10-CM

## 2024-05-01 DIAGNOSIS — C50919 Malignant neoplasm of unspecified site of unspecified female breast: Secondary | ICD-10-CM

## 2024-05-01 DIAGNOSIS — Z23 Encounter for immunization: Secondary | ICD-10-CM

## 2024-05-01 DIAGNOSIS — N1831 Chronic kidney disease, stage 3a: Secondary | ICD-10-CM

## 2024-05-03 NOTE — Telephone Encounter (Signed)
 Labs in date  Requested Prescriptions  Pending Prescriptions Disp Refills   diclofenac  Sodium (VOLTAREN ) 1 % GEL [Pharmacy Med Name: DICLOFENAC  1% GEL 100GM (RX)] 100 g 2    Sig: APPLY 2 GRAMS TOPICALLY TO THE AFFECTED AREA FOUR TIMES DAILY AS NEEDED     Analgesics:  Topicals Failed - 05/03/2024 12:55 PM      Failed - Manual Review: Labs are only required if the patient has taken medication for more than 8 weeks.      Failed - Cr in normal range and within 360 days    Creatinine  Date Value Ref Range Status  03/17/2024 1.14 (H) 0.44 - 1.00 mg/dL Final         Passed - PLT in normal range and within 360 days    Platelets  Date Value Ref Range Status  10/19/2023 358 150 - 450 x10E3/uL Final   Platelet Count  Date Value Ref Range Status  03/17/2024 286 150 - 400 K/uL Final         Passed - HGB in normal range and within 360 days    Hemoglobin  Date Value Ref Range Status  03/17/2024 14.2 12.0 - 15.0 g/dL Final  98/78/7974 85.1 11.1 - 15.9 g/dL Final         Passed - HCT in normal range and within 360 days    HCT  Date Value Ref Range Status  03/17/2024 41.7 36.0 - 46.0 % Final   Hematocrit  Date Value Ref Range Status  10/19/2023 43.4 34.0 - 46.6 % Final         Passed - eGFR is 30 or above and within 360 days    GFR calc Af Amer  Date Value Ref Range Status  11/01/2020 53 (L) >59 mL/min/1.73 Final    Comment:    **In accordance with recommendations from the NKF-ASN Task force,**   [facility] is in the process of updating its eGFR calculation to the   2021 CKD-EPI creatinine equation that estimates kidney function   without a race variable.    GFR, Estimated  Date Value Ref Range Status  03/17/2024 53 (L) >60 mL/min Final    Comment:    (NOTE) Calculated using the CKD-EPI Creatinine Equation (2021)    eGFR  Date Value Ref Range Status  10/19/2023 48 (L) >59 mL/min/1.73 Final         Passed - Patient is not pregnant      Passed - Valid encounter within  last 12 months    Recent Outpatient Visits           5 months ago Centrilobular emphysema (HCC)   [place] [facility] [place], Melanie DASEN, NP

## 2024-06-01 ENCOUNTER — Ambulatory Visit: Payer: Federal, State, Local not specified - PPO | Admitting: Radiation Oncology

## 2024-06-09 ENCOUNTER — Ambulatory Visit: Admitting: Nurse Practitioner

## 2024-06-14 ENCOUNTER — Other Ambulatory Visit: Payer: Self-pay | Admitting: Acute Care

## 2024-06-14 DIAGNOSIS — Z122 Encounter for screening for malignant neoplasm of respiratory organs: Secondary | ICD-10-CM

## 2024-06-14 DIAGNOSIS — Z87891 Personal history of nicotine dependence: Secondary | ICD-10-CM

## 2024-07-09 NOTE — Patient Instructions (Incomplete)
 Phentermine, Orlistat, Contrave, Metformin  Be Involved in Caring For Your Health:  Taking Medications When medications are taken as directed, they can greatly improve your health. But if they are not taken as prescribed, they may not work. In some cases, not taking them correctly can be harmful. To help ensure your treatment remains effective and safe, understand your medications and how to take them. Bring your medications to each visit for review by your provider.  Your lab results, notes, and after visit summary will be available on My Chart. We strongly encourage you to use this feature. If lab results are abnormal the clinic will contact you with the appropriate steps. If the clinic does not contact you assume the results are satisfactory. You can always view your results on My Chart. If you have questions regarding your health or results, please contact the clinic during office hours. You can also ask questions on My Chart.  We at [facility] are grateful that you chose us  to provide your care. We strive to provide evidence-based and compassionate care and are always looking for feedback. If you get a survey from the clinic please complete this so we can hear your opinions.  COPD and Physical Activity Chronic obstructive pulmonary disease (COPD) is a long-term, or chronic, condition that affects the lungs. COPD is a general term that can be used to describe many problems that cause inflammation of the lungs and limit airflow. These conditions include chronic bronchitis and emphysema. The main symptom of COPD is shortness of breath, which makes it harder to do even simple tasks. This can also make it harder to exercise and stay active. Talk with your health care provider about treatments to help you breathe better and actions you can take to prevent breathing problems during physical activity. What are the benefits of exercising when you have COPD? Exercising regularly is an  important part of a healthy lifestyle. You can still exercise and do physical activities even though you have COPD. Exercise and physical activity improve your shortness of breath by increasing blood flow (circulation). This causes your heart to pump more oxygen through your body. Moderate exercise can: Improve oxygen use. Increase your energy level. Help with shortness of breath. Strengthen your breathing muscles. Improve heart health. Help with sleep. Improve your self-esteem and feelings of self-worth. Lower depression, stress, and anxiety. Exercise can benefit everyone with COPD. The severity of your disease may affect how hard you can exercise, especially at first, but everyone can benefit. Talk with your health care provider about how much exercise is safe for you, and which activities and exercises are safe for you. What actions can I take to prevent breathing problems during physical activity? Sign up for a pulmonary rehabilitation program. This type of program may include: Education about lung diseases. Exercise classes that teach you how to exercise and be more active while improving your breathing. This usually involves: Exercise using your lower extremities, such as a stationary bicycle. About 30 minutes of exercise, 2 to 5 times per week, for 6 to 12 weeks. Strength training, such as push-ups or leg lifts. Nutrition education. Group classes in which you can talk with others who also have COPD and learn ways to manage stress. If you use an oxygen tank, you should use it while you exercise. Work with your health care provider to adjust your oxygen for your physical activity. Your resting flow rate is different from your flow rate during physical activity. How to manage your  breathing while exercising While you are exercising: Take slow breaths. Pace yourself, and do nottry to go too fast. Purse your lips while breathing out. Pursing your lips is similar to a kissing or whistling  position. If doing exercise that uses a quick burst of effort, such as weight lifting: Breathe in before starting the exercise. Breathe out during the hardest part of the exercise, such as raising the weights. Where to find support You can find support for exercising with COPD from: Your health care provider. A pulmonary rehabilitation program. Your local health department or community health programs. Support groups, either online or in-person. Your health care provider may be able to recommend support groups. Where to find more information You can find more information about exercising with COPD from: American Lung Association: lung.org COPD Foundation: copdfoundation.org Contact a health care provider if: Your symptoms get worse. You have nausea. You have a fever. You want to start a new exercise program or a new activity. Get help right away if: You have chest pain. You cannot breathe. These symptoms may represent a serious problem that is an emergency. Do not wait to see if the symptoms will go away. Get medical help right away. Call your local emergency services (911 in the [place] Do not drive yourself to the hospital. Summary COPD is a general term that can be used to describe many different lung problems that cause lung inflammation and limit airflow. This includes chronic bronchitis and emphysema. Exercise and physical activity improve your shortness of breath by increasing blood flow (circulation). This causes your heart to provide more oxygen to your body. Contact your health care provider before starting any exercise program or new activity. Ask your health care provider what exercises and activities are safe for you. This information is not intended to replace advice given to you by your health care provider. Make sure you discuss any questions you have with your health care provider. Document Revised: 07/30/2023 Document Reviewed: 07/30/2023 Elsevier Patient Education   2024 [employer].

## 2024-07-10 ENCOUNTER — Ambulatory Visit

## 2024-07-11 ENCOUNTER — Encounter: Payer: Self-pay | Admitting: Oncology

## 2024-07-12 ENCOUNTER — Ambulatory Visit: Admitting: Radiation Oncology

## 2024-07-14 ENCOUNTER — Ambulatory Visit: Admitting: Nurse Practitioner

## 2024-07-14 DIAGNOSIS — F321 Major depressive disorder, single episode, moderate: Secondary | ICD-10-CM

## 2024-07-14 DIAGNOSIS — Z23 Encounter for immunization: Secondary | ICD-10-CM

## 2024-07-14 DIAGNOSIS — J432 Centrilobular emphysema: Secondary | ICD-10-CM

## 2024-07-14 DIAGNOSIS — Z79811 Long term (current) use of aromatase inhibitors: Secondary | ICD-10-CM

## 2024-07-14 DIAGNOSIS — N1831 Chronic kidney disease, stage 3a: Secondary | ICD-10-CM

## 2024-07-14 DIAGNOSIS — E782 Mixed hyperlipidemia: Secondary | ICD-10-CM

## 2024-07-14 DIAGNOSIS — G4733 Obstructive sleep apnea (adult) (pediatric): Secondary | ICD-10-CM

## 2024-07-14 DIAGNOSIS — C50919 Malignant neoplasm of unspecified site of unspecified female breast: Secondary | ICD-10-CM

## 2024-07-14 DIAGNOSIS — K760 Fatty (change of) liver, not elsewhere classified: Secondary | ICD-10-CM

## 2024-07-14 DIAGNOSIS — I10 Essential (primary) hypertension: Secondary | ICD-10-CM

## 2024-07-17 ENCOUNTER — Other Ambulatory Visit: Payer: Self-pay

## 2024-07-17 DIAGNOSIS — C50919 Malignant neoplasm of unspecified site of unspecified female breast: Secondary | ICD-10-CM

## 2024-07-20 ENCOUNTER — Other Ambulatory Visit: Payer: Self-pay | Admitting: Nurse Practitioner

## 2024-07-21 NOTE — Telephone Encounter (Signed)
 Too soon for refill.  Requested Prescriptions  Pending Prescriptions Disp Refills   atorvastatin  (LIPITOR) 80 MG tablet [Pharmacy Med Name: ATORVASTATIN  80MG  TABLETS] 90 tablet 3    Sig: TAKE 1 TABLET(80 MG) BY MOUTH DAILY     Cardiovascular:  Antilipid - Statins Failed - 07/21/2024  1:51 PM      Failed - Lipid Panel in normal range within the last 12 months    Cholesterol, Total  Date Value Ref Range Status  10/19/2023 168 100 - 199 mg/dL Final   Cholesterol Piccolo, Waived  Date Value Ref Range Status  10/20/2019 166 <200 mg/dL Final    Comment:                            Desirable                <200                         Borderline High      200- 239                         High                     >239    LDL Chol Calc (NIH)  Date Value Ref Range Status  10/19/2023 95 0 - 99 mg/dL Final   HDL  Date Value Ref Range Status  10/19/2023 37 (L) >39 mg/dL Final   Triglycerides  Date Value Ref Range Status  10/19/2023 213 (H) 0 - 149 mg/dL Final   Triglycerides Piccolo,Waived  Date Value Ref Range Status  10/20/2019 276 (H) <150 mg/dL Final    Comment:                            Normal                   <150                         Borderline High     150 - 199                         High                200 - 499                         Very High                >499          Passed - Patient is not pregnant      Passed - Valid encounter within last 12 months    Recent Outpatient Visits           8 months ago Centrilobular emphysema (HCC)   [place] [facility] [place], Melanie DASEN, NP

## 2024-08-21 ENCOUNTER — Other Ambulatory Visit: Payer: Self-pay | Admitting: Nurse Practitioner

## 2024-08-21 DIAGNOSIS — J432 Centrilobular emphysema: Secondary | ICD-10-CM

## 2024-08-22 NOTE — Telephone Encounter (Signed)
 Requested Prescriptions  Pending Prescriptions Disp Refills   ANORO ELLIPTA  62.5-25 MCG/ACT AEPB [Pharmacy Med Name: ANORO ELLIPTA  62.5-25 ORAL INH(30S)] 60 each 5    Sig: INHALE 1 PUFF INTO THE LUNGS DAILY AT 6 AM     Pulmonology:  Combination Products Passed - 08/22/2024 11:23 AM      Passed - Valid encounter within last 12 months    Recent Outpatient Visits           9 months ago Centrilobular emphysema (HCC)   [facility] [place], Melanie DASEN, NP

## 2024-09-13 ENCOUNTER — Ambulatory Visit: Admission: RE | Admit: 2024-09-13

## 2024-09-15 ENCOUNTER — Other Ambulatory Visit

## 2024-09-15 ENCOUNTER — Ambulatory Visit: Admitting: Oncology

## 2024-09-16 ENCOUNTER — Other Ambulatory Visit: Payer: Self-pay | Admitting: Oncology

## 2024-10-07 ENCOUNTER — Encounter: Payer: Self-pay | Admitting: Nurse Practitioner

## 2024-10-07 DIAGNOSIS — J432 Centrilobular emphysema: Secondary | ICD-10-CM

## 2024-10-09 ENCOUNTER — Other Ambulatory Visit: Payer: Self-pay

## 2024-10-09 DIAGNOSIS — J432 Centrilobular emphysema: Secondary | ICD-10-CM

## 2024-10-09 MED ORDER — UMECLIDINIUM-VILANTEROL 62.5-25 MCG/ACT IN AEPB: 1.0000 | INHALATION_SPRAY | Freq: Every day | RESPIRATORY_TRACT | 2 refills | Status: DC

## 2024-10-09 MED ORDER — UMECLIDINIUM-VILANTEROL 62.5-25 MCG/ACT IN AEPB: 1.0000 | INHALATION_SPRAY | Freq: Every day | RESPIRATORY_TRACT | 2 refills | Status: AC

## 2024-10-11 ENCOUNTER — Ambulatory Visit: Admitting: Nurse Practitioner

## 2024-10-14 ENCOUNTER — Other Ambulatory Visit: Payer: Self-pay | Admitting: Nurse Practitioner

## 2024-10-16 NOTE — Telephone Encounter (Signed)
 Requested Prescriptions  Pending Prescriptions Disp Refills   diclofenac  Sodium (VOLTAREN ) 1 % GEL [Pharmacy Med Name: DICLOFENAC  1% GEL 100GM (OTC)] 100 g 0    Sig: APPLY 2 GRAMS TOPICALLY TO THE AFFECTED AREA FOUR TIMES DAILY AS NEEDED     Analgesics:  Topicals Failed - 10/16/2024 11:39 AM      Failed - Manual Review: Labs are only required if the patient has taken medication for more than 8 weeks.      Failed - Cr in normal range and within 360 days    Creatinine  Date Value Ref Range Status  03/17/2024 1.14 (H) 0.44 - 1.00 mg/dL Final         Passed - PLT in normal range and within 360 days    Platelets  Date Value Ref Range Status  10/19/2023 358 150 - 450 x10E3/uL Final   Platelet Count  Date Value Ref Range Status  03/17/2024 286 150 - 400 K/uL Final         Passed - HGB in normal range and within 360 days    Hemoglobin  Date Value Ref Range Status  03/17/2024 14.2 12.0 - 15.0 g/dL Final  98/78/7974 85.1 11.1 - 15.9 g/dL Final         Passed - HCT in normal range and within 360 days    HCT  Date Value Ref Range Status  03/17/2024 41.7 36.0 - 46.0 % Final   Hematocrit  Date Value Ref Range Status  10/19/2023 43.4 34.0 - 46.6 % Final         Passed - eGFR is 30 or above and within 360 days    GFR calc Af Amer  Date Value Ref Range Status  11/01/2020 53 (L) >59 mL/min/1.73 Final    Comment:    **In accordance with recommendations from the NKF-ASN Task force,**   [facility] is in the process of updating its eGFR calculation to the   2021 CKD-EPI creatinine equation that estimates kidney function   without a race variable.    GFR, Estimated  Date Value Ref Range Status  03/17/2024 53 (L) >60 mL/min Final    Comment:    (NOTE) Calculated using the CKD-EPI Creatinine Equation (2021)    eGFR  Date Value Ref Range Status  10/19/2023 48 (L) >59 mL/min/1.73 Final         Passed - Patient is not pregnant      Passed - Valid encounter within last 12 months     Recent Outpatient Visits           11 months ago Centrilobular emphysema (HCC)   [place] [facility] [place], Melanie DASEN, NP

## 2024-10-16 NOTE — Telephone Encounter (Signed)
 Requested Prescriptions  Pending Prescriptions Disp Refills   fluticasone  (FLONASE ) 50 MCG/ACT nasal spray 48 g 0    Sig: Shake liquid and instill 2 sprays onto each nostril once daily.     Ear, Nose, and Throat: Nasal Preparations - Corticosteroids Passed - 10/16/2024  1:56 PM      Passed - Valid encounter within last 12 months    Recent Outpatient Visits           11 months ago Centrilobular emphysema (HCC)   [place] [facility] [place], Melanie DASEN, NP

## 2024-10-17 ENCOUNTER — Telehealth: Payer: Self-pay | Admitting: Radiation Oncology

## 2024-10-17 NOTE — Telephone Encounter (Signed)
 Pt called wanted to r/s appt til mid-March after mammogram - just a mid-morning appt - Friday if possible - no call back required - messaged [place] clinical team - [facility]

## 2024-10-18 ENCOUNTER — Ambulatory Visit: Admitting: Radiation Oncology

## 2024-10-27 ENCOUNTER — Other Ambulatory Visit: Payer: Self-pay | Admitting: Nurse Practitioner

## 2024-10-27 ENCOUNTER — Inpatient Hospital Stay: Admitting: Oncology

## 2024-10-27 ENCOUNTER — Inpatient Hospital Stay

## 2024-11-28 ENCOUNTER — Other Ambulatory Visit

## 2024-11-28 ENCOUNTER — Encounter

## 2024-12-01 ENCOUNTER — Inpatient Hospital Stay: Admitting: Oncology

## 2024-12-01 ENCOUNTER — Inpatient Hospital Stay

## 2024-12-07 ENCOUNTER — Ambulatory Visit: Admitting: Radiation Oncology

## 2024-12-22 ENCOUNTER — Ambulatory Visit: Admitting: Nurse Practitioner

## 2025-10-01 ENCOUNTER — Ambulatory Visit: Admitting: Nurse Practitioner
